# Patient Record
Sex: Female | Born: 1935 | Race: White | Hispanic: No | Smoking: Former smoker
Health system: Southern US, Community
[De-identification: ages and names within clinical notes are randomized; demographics above are authoritative.]

## PROBLEM LIST (undated history)

## (undated) DIAGNOSIS — E871 Hypo-osmolality and hyponatremia: Secondary | ICD-10-CM

## (undated) DIAGNOSIS — I10 Essential (primary) hypertension: Secondary | ICD-10-CM

## (undated) DIAGNOSIS — E039 Hypothyroidism, unspecified: Secondary | ICD-10-CM

## (undated) DIAGNOSIS — N189 Chronic kidney disease, unspecified: Secondary | ICD-10-CM

## (undated) DIAGNOSIS — F419 Anxiety disorder, unspecified: Secondary | ICD-10-CM

## (undated) DIAGNOSIS — M1990 Unspecified osteoarthritis, unspecified site: Secondary | ICD-10-CM

## (undated) DIAGNOSIS — E876 Hypokalemia: Secondary | ICD-10-CM

## (undated) HISTORY — DX: Hypokalemia: E87.6

## (undated) HISTORY — DX: Anxiety disorder, unspecified: F41.9

## (undated) HISTORY — PX: SHOULDER SURGERY: SHX246

## (undated) HISTORY — DX: Unspecified osteoarthritis, unspecified site: M19.90

## (undated) HISTORY — DX: Hypo-osmolality and hyponatremia: E87.1

## (undated) HISTORY — DX: Hypothyroidism, unspecified: E03.9

## (undated) HISTORY — DX: Chronic kidney disease, unspecified: N18.9

## (undated) HISTORY — PX: WRIST SURGERY: SHX841

## (undated) HISTORY — DX: Essential (primary) hypertension: I10

---

## 1998-03-09 ENCOUNTER — Ambulatory Visit (HOSPITAL_COMMUNITY): Admission: RE | Admit: 1998-03-09 | Discharge: 1998-03-09 | Payer: Self-pay | Admitting: Endocrinology

## 1998-03-09 ENCOUNTER — Encounter: Payer: Self-pay | Admitting: Endocrinology

## 1998-03-15 ENCOUNTER — Other Ambulatory Visit: Admission: RE | Admit: 1998-03-15 | Discharge: 1998-03-15 | Payer: Self-pay | Admitting: Obstetrics and Gynecology

## 1999-10-06 ENCOUNTER — Other Ambulatory Visit: Admission: RE | Admit: 1999-10-06 | Discharge: 1999-10-06 | Payer: Self-pay | Admitting: Obstetrics and Gynecology

## 1999-11-08 ENCOUNTER — Encounter: Admission: RE | Admit: 1999-11-08 | Discharge: 1999-11-08 | Payer: Self-pay | Admitting: Urology

## 1999-11-08 ENCOUNTER — Encounter: Payer: Self-pay | Admitting: Urology

## 2002-10-02 ENCOUNTER — Encounter: Payer: Self-pay | Admitting: Endocrinology

## 2002-10-02 ENCOUNTER — Encounter: Admission: RE | Admit: 2002-10-02 | Discharge: 2002-10-02 | Payer: Self-pay | Admitting: Endocrinology

## 2005-04-20 ENCOUNTER — Ambulatory Visit (HOSPITAL_COMMUNITY): Admission: RE | Admit: 2005-04-20 | Discharge: 2005-04-21 | Payer: Self-pay | Admitting: Orthopedic Surgery

## 2007-02-06 ENCOUNTER — Encounter: Admission: RE | Admit: 2007-02-06 | Discharge: 2007-02-06 | Payer: Self-pay | Admitting: Endocrinology

## 2010-08-26 NOTE — Op Note (Signed)
NAME:  Bethany Blake, Bethany Blake                   ACCOUNT NO.:  [id number]   MEDICAL RECORD NO.:  [id number]          PATIENT TYPE:  AMB   LOCATION:  DAY                          FACILITY:  [facility]   PHYSICIAN:  Ronald A. Gioffre, M.D.DATE OF BIRTH:  [birth year]   DATE OF PROCEDURE:  04/19/2005  DATE OF DISCHARGE:                                 OPERATIVE REPORT   SURGEON:  Georges Lynch. Darrelyn Hillock, M.D.   ASSISTANT:  Jamelle Rushing, P.A.   PREOPERATIVE DIAGNOSIS:  1.  Complete retracted tear of the rotator cuff tendon, right shoulder with      absence of abduction preop.  2.  Severe degenerative arthritis, right shoulder.   POSTOPERATIVE DIAGNOSIS:  1.  Complete retracted tear of the rotator cuff tendon, right shoulder with      absence of abduction preop.  2.  Severe degenerative arthritis, right shoulder.   OPERATION:  1.  Open decompression and acromioplasty, right shoulder.  2.  Exploration the rotator cuff, right shoulder.  3.  Synovectomy, right shoulder.   DESCRIPTION OF PROCEDURE:  Under general anesthesia, routine orthopedic prep  and draping of the right shoulder was carried out. Note prior to the general  anesthetic, she had an interscalene nerve block on the right. Sterile prep  and draping was carried out, an incision was made over the anterior aspect  of the right shoulder, bleeders identified and cauterized. I went down and  identified the acromion and the acromion was severely arthritic thickened  and down sloped. At first I went ahead and I did a partial acromionectomy  utilizing the oscillating saw and a bur. Once we established the subacromial  space, we noted that there was NO ROTATOR CUFF PRESENT. It was completely  disintegrated. She had a severe synovectomy with a large amount of fluid in  the joint. I did a synovectomy at this particular time. I did the open  acromionectomy, I explored the cuff and there was no cuff to repair.  I  explored the long head of the biceps, it was  intact but it was markedly  thinned. So basically this was an irreparable cuff lesion. There was no room  or anything to attach a graft to. We thoroughly irrigated out the area,  reapproximated the deltoid tendon and the muscle in the usual fashion. The  subcu was closed with #0 Vicryl, skin with metal staples and a sterile  dressing was applied. She was placed in a shoulder immobilizer.           ______________________________  Georges Lynch Darrelyn Hillock, M.D.     RAG/MEDQ  D:  04/20/2005  T:  04/21/2005  Job:  [id number]

## 2011-01-03 ENCOUNTER — Emergency Department (HOSPITAL_COMMUNITY): Payer: Medicare Other

## 2011-01-03 ENCOUNTER — Observation Stay (HOSPITAL_COMMUNITY)
Admission: EM | Admit: 2011-01-03 | Discharge: 2011-01-04 | Disposition: A | Discharge status: Home / Self Care | Payer: Medicare Other | Attending: Internal Medicine | Admitting: Internal Medicine

## 2011-01-03 DIAGNOSIS — E039 Hypothyroidism, unspecified: Secondary | ICD-10-CM | POA: Insufficient documentation

## 2011-01-03 DIAGNOSIS — I10 Essential (primary) hypertension: Secondary | ICD-10-CM | POA: Insufficient documentation

## 2011-01-03 DIAGNOSIS — S52123A Displaced fracture of head of unspecified radius, initial encounter for closed fracture: Secondary | ICD-10-CM | POA: Insufficient documentation

## 2011-01-03 DIAGNOSIS — S1093XA Contusion of unspecified part of neck, initial encounter: Secondary | ICD-10-CM | POA: Insufficient documentation

## 2011-01-03 DIAGNOSIS — S52539A Colles' fracture of unspecified radius, initial encounter for closed fracture: Secondary | ICD-10-CM | POA: Insufficient documentation

## 2011-01-03 DIAGNOSIS — IMO0002 Reserved for concepts with insufficient information to code with codable children: Secondary | ICD-10-CM | POA: Insufficient documentation

## 2011-01-03 DIAGNOSIS — S0003XA Contusion of scalp, initial encounter: Secondary | ICD-10-CM | POA: Insufficient documentation

## 2011-01-03 DIAGNOSIS — S060X0A Concussion without loss of consciousness, initial encounter: Principal | ICD-10-CM | POA: Insufficient documentation

## 2011-01-03 DIAGNOSIS — S62319A Displaced fracture of base of unspecified metacarpal bone, initial encounter for closed fracture: Secondary | ICD-10-CM | POA: Insufficient documentation

## 2011-01-03 DIAGNOSIS — Y998 Other external cause status: Secondary | ICD-10-CM | POA: Insufficient documentation

## 2011-01-03 DIAGNOSIS — W101XXA Fall (on)(from) sidewalk curb, initial encounter: Secondary | ICD-10-CM | POA: Insufficient documentation

## 2011-01-03 DIAGNOSIS — S0180XA Unspecified open wound of other part of head, initial encounter: Secondary | ICD-10-CM | POA: Insufficient documentation

## 2011-01-03 DIAGNOSIS — Y9229 Other specified public building as the place of occurrence of the external cause: Secondary | ICD-10-CM | POA: Insufficient documentation

## 2011-01-03 DIAGNOSIS — R112 Nausea with vomiting, unspecified: Secondary | ICD-10-CM | POA: Insufficient documentation

## 2011-01-03 LAB — DIFFERENTIAL
Basophils Relative: 0 % (ref 0–1)
Lymphs Abs: 1.2 10*3/uL (ref 0.7–4.0)
Monocytes Relative: 6 % (ref 3–12)
Neutro Abs: 5.9 10*3/uL (ref 1.7–7.7)
Neutrophils Relative %: 77 % (ref 43–77)

## 2011-01-03 LAB — BASIC METABOLIC PANEL
CO2: 23 mEq/L (ref 19–32)
Calcium: 8.7 mg/dL (ref 8.4–10.5)
Chloride: 95 mEq/L — ABNORMAL LOW (ref 96–112)
Glucose, Bld: 122 mg/dL — ABNORMAL HIGH (ref 70–99)
Sodium: 130 mEq/L — ABNORMAL LOW (ref 135–145)

## 2011-01-03 LAB — CBC
Hemoglobin: 12.7 g/dL (ref 12.0–15.0)
MCH: 29.7 pg (ref 26.0–34.0)
RBC: 4.28 MIL/uL (ref 3.87–5.11)

## 2011-01-04 LAB — CBC
HCT: 34.5 % — ABNORMAL LOW (ref 36.0–46.0)
HCT: 33.5 % — ABNORMAL LOW (ref 36.0–46.0)
Hemoglobin: 11.8 g/dL — ABNORMAL LOW (ref 12.0–15.0)
MCH: 28.9 pg (ref 26.0–34.0)
MCH: 28.8 pg (ref 26.0–34.0)
MCHC: 33.7 g/dL (ref 30.0–36.0)
MCV: 84.4 fL (ref 78.0–100.0)
MCV: 85.5 fL (ref 78.0–100.0)
Platelets: 231 10*3/uL (ref 150–400)
RBC: 4.09 MIL/uL (ref 3.87–5.11)
RDW: 13 % (ref 11.5–15.5)

## 2011-01-04 LAB — BASIC METABOLIC PANEL
BUN: 9 mg/dL (ref 6–23)
Creatinine, Ser: 0.72 mg/dL (ref 0.50–1.10)
GFR calc Af Amer: >60 mL/min (ref >60)
GFR calc non Af Amer: >60 mL/min (ref >60)
Glucose, Bld: 168 mg/dL — ABNORMAL HIGH (ref 70–99)

## 2011-01-07 NOTE — H&P (Signed)
  NAMESHIELA, Bethany Blake                   ACCOUNT NO.:  [id number]  MEDICAL RECORD NO.:  [id number]  LOCATION:  1414                         FACILITY:  [facility]  PHYSICIAN:  Dionne Ano. Naina Sleeper, M.D.DATE OF BIRTH:  May 11, 1921  DATE OF ADMISSION:  01/03/2011 DATE OF DISCHARGE:                             HISTORY & PHYSICAL   ADDENDUM:  [name] also is noted to have a fifth metacarpal base fracture right hand where she is minimally displaced and acceptable for close treatment.  I have discussed this with her at length and we did go ahead and split this well.  DIAGNOSIS/IMPRESSION:  Closed fifth metacarpal fracture.  We did perform I and D of skin, subcutaneous tissue, right dorsal forearm and she tolerated this well at her bedside.     Dionne Ano. Amanda Pea, M.D.     [facility]  D:  01/03/2011  T:  01/03/2011  Job:  [id number]  Electronically Signed by Dominica Severin M.D. on 01/07/2011 05:31:15 PM

## 2011-01-07 NOTE — H&P (Signed)
Bethany Blake, Bethany Blake                   ACCOUNT NO.:  [id number]  MEDICAL RECORD NO.:  [id number]  LOCATION:  WLED                         FACILITY:  [facility]  PHYSICIAN:  Dionne Ano. Selmer Adduci, M.D.DATE OF BIRTH:  06-01-1921  DATE OF ADMISSION:  01/03/2011 DATE OF DISCHARGE:                             HISTORY & PHYSICAL   It was pleasure to see Bethany Blake today.  Bethany Blake is a pleasant female who fell and sustained laceration to her head as well as abrasions to the lower extremities, upper extremities and a left minimally displaced proximal radius fracture at the elbow as well as a significantly displaced right distal radius fracture.  I was asked to see her for orthopedic needs.  She has been stabilized by the emergency room staff.  I should note she had a CT of her head which is negative for intracranial abnormality.  She has had a CT of her C-spine which is negative.  PAST MEDICAL HISTORY: 1. Thyroid disease. 2. Hypertension.  She is noted under medicines to take clonidine and thyroid replacement medicines.  PAST SURGICAL HISTORY:  Includes shoulder surgery by Dr. Darrelyn Hillock.  She lives with her husband.  She does not smoke or drink.  ALLERGIES:  She is allergic to SULFA.  REVIEW OF SYSTEMS:  Negative for fever, chills, nausea, vomiting, or malaise.  PHYSICAL EXAMINATION:  GENERAL:  Patient is a pleasant female.  Alert and oriented in no acute distress. VITAL SIGNS:  Stable. HEENT:  The patient has periorbital swelling and ecchymosis about her right eye.  Oropharynx and nasopharynx were clear.  She breathes normally.  She has no JVD.  No supraclavicular mass. EXTREMITIES:  Shoulder on the right side has a surgical incision which is well healed.  She has a very stiff arthritic shoulder, it appears on the right side.  Left shoulder is nontender.  Left upper extremity has ecchymosis about it.  No focal defects other than a nondisplaced proximal radius fracture.  This is a radial neck  head region.  She has ecchymosis around her IV site and blood pressure cuff on.  Left upper extremity is reviewed at length.  The right upper extremity has a displaced distal radius fracture.  She is neurovascularly intact. Otherwise she has laceration of the dorsal aspect of her forearm, but this does not appear to be a communicating open fracture.  There is no hematoma, blood emanating from the site.  Lower extremity examination shows abrasions about both knees, but she can perform a straight-leg- raise without pain. PELVIS:  Stable. ABDOMEN:  Nontender. CHEST:  Has equal expansion and is clear.  X-rays show a nondisplaced left elbow fracture about the radial neck head region.  X-rays of the right wrist showed a displaced distal radius fracture with acute angulation.  This is a Smith-type pattern and is unstable.  CT of the head is negative other than the swelling outside of the skull.  The patient's C-spine is negative other than some significant degree of degenerative change.  IMPRESSION: 1. Status post fall with facial trauma, periorbital swelling and     bruising as well as knee contusions and abrasions. 2. Status post fall with distal  radius fracture, right upper extremity     acutely displaced in a Smith-type pattern. 3. Left elbow, radial head, and neck fracture. 4. Thyroid disease. 5. Hypertension. 6. The patient is 77 years of age with some degree of comorbidities.  RECOMMENDATIONS:  I have discussed with the patient findings in regards to the left elbow.  We will treat this conservatively with immobilization and cautious use.  Typically, these healed quite well. In regards to the right upper extremity, I have recommended ORIF at a mutually convenient time in the future.  We have discussed risks and benefits of bleeding, infection, anesthesia, damage to normal structures, and failure of the surgery to accomplish its intended goals of relieving symptoms and restoring  function.  With this in mind, we will proceed accordingly at a mutually convenient time if she desires. She and I have had a long discussion about this today and the relevant issues, dos and don'ts.  The patient's lower extremity should do well with simple treatment of her abrasions.  We will watch her medical status in the hospital closely and ask her to notify should any problems occur.  These notes have been discussed and all questions have been encouraged and answered.     Dionne Ano. Amanda Pea, M.D.     [facility]  D:  01/03/2011  T:  01/03/2011  Job:  [id number]  Electronically Signed by Dominica Severin M.D. on 01/07/2011 05:31:12 PM

## 2011-01-17 NOTE — Discharge Summary (Signed)
Bethany Blake, Bethany Blake                   ACCOUNT NO.:  [id number]  MEDICAL RECORD NO.:  [id number]  LOCATION:  1414                         FACILITY:  [facility]  PHYSICIAN:  Kathlen Mody, MD       DATE OF BIRTH:  Mar 15, 1922  DATE OF ADMISSION:  01/03/2011 DATE OF DISCHARGE:  01/04/2011                              DISCHARGE SUMMARY   DISCHARGE DIAGNOSES: 1. Status post mechanical fall with a large periorbital hematoma with     multiple fractures including a right radius, left elbow, and right     MC. 2. Hypertension. 3. Hypothyroidism. 4. Hyponatremia, persistent. 5. Hypokalemia, repleted.  DISCHARGE MEDICATIONS: 1. Vicodin 5/325, 1-2 tabs q.8 hours as needed. 2. Clonidine 0.2 mg 1 tab twice a day. 3. Fish oil 200 mg 2 capsules daily. 4. Synthroid 100 mg 1 tab daily.  PERTINENT LABS:  The patient had a CBC which was within normal limits. Basic metabolic panel is significant for a sodium of 130, chloride of 95, and glucose of 122.  Repeat CBC showed a hemoglobin of 11.8.  CBC on January 04, 2011, showed hemoglobin of 11.3 and a basic metabolic panel showed a sodium of 129 and potassium of 3.4.  DIAGNOSTIC STUDIES: 1. The patient had a CT head without contrast, which showed soft     tissue hematoma overlying the right frontal bone.  No evidence of     calvarial fracture and no evidence of acute intracranial     abnormality.  CT of cervical spine shows no evidence of traumatic     injury to the cervical spine, multilevel degenerative changes. 2. X-ray of the right breast showed distal radius and 5th metacarpal     fractures. 3. X-ray of the pelvis shows no acute bony findings. 4. X-ray of the left elbow shows cortical irregularity involving the     lateral aspect of the radial head seen only on the provided AP,     radiographically worrisome for minimally displaced radial head     fracture.  Correlation for point tenderness at this location is     recommended. 5. X-ray of the  right wrist, improved alignment of the comminuted     fracture through the distal radial metadiaphysis, persistent volar     and radial displacement in order though previously noted angulation     has largely resolved. 6. Two view chest x-ray on January 04, 2011, shows mild left basilar     opacity, may reflect atelectasis, or mild pneumonia, mild     cardiomegaly, and degenerative disk changes at the right humeral     head.  BRIEF HOSPITAL COURSE:  This is an 77 year old lady with history of hypertension, hypothyroidism, was brought in by EMS because of mechanical fall earlier today.  She was at [facility] and fell walking over the curb and injured her face.  She was brought to the ED.  She was found to have lacerations with the large periorbital hematoma with multiple fractures on the right wrist and left elbow.  An Orthopedic consult was called while in the ER and recommended treat the patient conservatively with immobilization and cautious use, and  also recommended ORIF of the right upper extremity at a convenient time in the future.  Patient's lower extremity did not have any fractures and required simple treatment of her abrasions.  The patient's 5th metacarpal base fracture was minimally displaced and amenable for closed treatment.  She also underwent I and D of the skin subcutaneous tissue and right dorsal forearm and she tolerated the procedure well at the bedside.  Further, patient was admitted to hospitalist service for observation overnight for her blood pressure and she was put on neuro checks.  On January 04, 2011, the patient and patient's family were persistently wanted to go home.  I have discussed personally the risks of going home without further evaluation for her hyponatremia and for her electrolyte imbalances, but the patient and her family were persistent on going home and they said they will follow with their primary care physician as needed.   The patient's PT/OT evaluation was obtained.  PT recommended home PT which the patient and the family have persistently refused at this time.  The patient's blood pressure was controlled. Hypothyroidism, she was continued on home dose of Synthroid.  PHYSICAL EXAMINATION:  VITAL SIGNS:  Patient's vitals on the day of discharge was a temperature of 98.2, pulse of 82, blood pressure 166/70, and saturating 97% on 2L. GENERAL:  On exam, patient was alert, afebrile, comfortable, and in no acute distress. CARDIOVASCULAR:  S1, S2 heard. RESPIRATORY:  Shows clear to auscultation bilaterally.  ABDOMEN:  Soft and nontender. MUSCULOSKELETAL:  Multiple bruising over her extremities and a periorbital hematoma.          ______________________________ Kathlen Mody, MD     [facility]  D:  01/16/2011  T:  01/17/2011  Job:  [id number]  Electronically Signed by Kathlen Mody MD on 01/17/2011 10:33:48 PM

## 2011-02-05 NOTE — H&P (Signed)
NAMEBRYER, Bethany Blake                   ACCOUNT NO.:  [id number]  MEDICAL RECORD NO.:  [id number]  LOCATION:  WLED                         FACILITY:  [facility]  PHYSICIAN:  Jonny Ruiz, MD    DATE OF BIRTH:  12-Jul-1921  DATE OF ADMISSION:  01/03/2011 DATE OF DISCHARGE:                             HISTORY & PHYSICAL   CHIEF COMPLAINT:  Fall.  HISTORY OF PRESENT ILLNESS:  The patient is an 77 year old healthy woman presenting to the ED via EMS because of a fall earlier today.  She was at [facility] and fell walking over the curb and injured her face.  She was brought to the ED alert and oriented.  She was found with lacerations in the face and a large periorbital hematoma as well as multiple fractures on the right wrist and left elbow.  She is currently asymptomatic.  She was having nausea, received Zofran earlier and now she is better.  She also had a headache earlier, but now has resolved. She is currently asymptomatic.  She had no warning symptoms prior to the fall, specifically denied any chest pain or palpitations, lightheadedness, or dizziness.  She has no history of falls.  She is otherwise pretty healthy.  PAST MEDICAL HISTORY: 1. Hypertension. 2. Hypothyroidism.  SURGICAL HISTORY:  Shoulder repair.  MEDICATIONS: 1. Synthroid 100 mcg a day. 2. Clonidine 0.2 mg p.o. b.i.d. 3. Aspirin over-the-counter 81 mg a day  ALLERGIES:  SULFA.  SOCIAL HISTORY:  The patient lives with her husband.  She takes care of herself.  She denies toxic habits.  Specifically, no tobacco or alcohol.  PHYSICAL EXAMINATION:  VITAL SIGNS:  The blood pressure is 128/81, pulse 81, respirations 18, temperature 98.7, O2 sat 96%. GENERAL APPEARANCE:  The patient appears comfortable in bed.  She is alert and oriented. HEENT: The patient has a large right periorbital hematoma with a suture laceration.  I am unable to see her right eye.  The left eye is intact. Nose intact. NECK:  Supple  and nontender.  No carotid bruits. HEART:  Regular, S1 and S2 without gallops, murmurs, or rubs. LUNGS:  Clear to auscultation. ABDOMEN:  Soft, nontender. EXTREMITIES: The patient has already been placed on cast by Ortho earlier today, right arm and left.  Lower extremities normal with no deformities, no clubbing, cyanosis or edema. NEUROLOGICAL EXAMINATION:  Grossly nonfocal.  DIAGNOSTIC DATA:  CT head, no hematomas, no hemorrhage.  CT Neck:  No fractures or dislocation.  X-rays of the pelvis normal bilaterally.  X- rays of the arms showing right radius closed mid, left elbow fracture, closed and right MC fracture.  EKG not performed.  Basic metabolic panel:  Sodium 130, potassium 3.8, chloride 95, carbon dioxide 23, glucose 122, BUN 10, creatinine 0.59, calcium 8.7.  WBC 7.7, hemoglobin 12.7, hematocrit 36.1, platelet count 223.  ASSESSMENT AND PLAN:  An 77 year old female presenting with a mechanical fall resulting in, 1. Head concussion with right periorbital hematoma. 2. Laceration, repaired by ED physician. 3. Multiple fractures including right radius, left elbow, right MC. 4. Headache and nausea, resolving. 5. History of hypertension and hypothyroidism.  PLAN:  Admit the patient to  observation telemetry.  Neuro checks. Control pain.  Fractures will be managed as an outpatient with elective ORIF.  Appointment given for Tuesday next week.  The patient will continue on clonidine, will receive a dose tonight and then in the morning.  The patient already took dose of Synthroid earlier today.  We will continue at the same dose.  Repeat H and H in a.m.          ______________________________ Jonny Ruiz, MD     GL/MEDQ  D:  01/03/2011  T:  01/03/2011  Job:  [id number]  Electronically Signed by Jonny Ruiz MD on 02/05/2011 09:30:16 PM

## 2011-07-24 ENCOUNTER — Encounter: Payer: Self-pay | Admitting: Gastroenterology

## 2011-08-15 ENCOUNTER — Ambulatory Visit (INDEPENDENT_AMBULATORY_CARE_PROVIDER_SITE_OTHER): Payer: Medicare Other | Admitting: Gastroenterology

## 2011-08-15 ENCOUNTER — Encounter: Payer: Self-pay | Admitting: Gastroenterology

## 2011-08-15 DIAGNOSIS — K625 Hemorrhage of anus and rectum: Secondary | ICD-10-CM

## 2011-08-15 DIAGNOSIS — K5900 Constipation, unspecified: Secondary | ICD-10-CM

## 2011-08-15 MED ORDER — MOVIPREP 100 G PO SOLR: 1.0000 | ORAL | Status: DC

## 2011-08-15 NOTE — Patient Instructions (Signed)
You will be set up for a colonoscopy (LEC) for rectal bleeding. A copy of this information will be made available to Dr. Juleen China.

## 2011-08-15 NOTE — Progress Notes (Signed)
HPI: This is a    78 year old woman whom I am meeting for the first time today.  She is here with her daughter  She is always pretty constipated.  Skipped the prune juice, laxatives.  Became more constipated than usual.  Didn't have BM for 2-3 days.  Had very hard, solid stool had to push strain to expel.  She had dripping of blood from rectum but no pain.  The dripping of blood occurred one day and then a small amount the next day.  No colon polyps or cancers.  She has never had a colonoscopy  Overall stable weight.   Maybe a couple pounds down intentionally.    Usually has a BM every day.  Takes equate bid, prune juice.  Has always been constipated.    She takes no pain meds.    Review of systems: Pertinent positive and negative review of systems were noted in the above HPI section. Complete review of systems was performed and was otherwise normal.    Past Medical History  Diagnosis Date  . Hypertension   . Hypothyroidism   . Hyponatremia   . Hypokalemia   . Arthritis   . Anxiety     Past Surgical History  Procedure Date  . Shoulder surgery   . Wrist surgery     Current Outpatient Prescriptions  Medication Sig Dispense Refill  . cloNIDine (CATAPRES) 0.2 MG tablet Take 0.2 mg by mouth 3 (three) times daily.      Marland Kitchen docusate sodium (COLACE) 100 MG capsule Take 100 mg by mouth 3 (three) times daily as needed.      Marland Kitchen levothyroxine (SYNTHROID, LEVOTHROID) 100 MCG tablet Take 100 mcg by mouth daily.      . nebivolol (BYSTOLIC) 5 MG tablet Take 5 mg by mouth daily.      Marland Kitchen olmesartan (BENICAR) 20 MG tablet Take 20 mg by mouth daily.      . Omega-3 Fatty Acids (FISH OIL) 1200 MG CAPS Take 2 capsules by mouth daily.      Marland Kitchen spironolactone (ALDACTONE) 25 MG tablet Take 25 mg by mouth daily.        Allergies as of 08/15/2011 - Review Complete 08/15/2011  Allergen Reaction Noted  . Sulfa antibiotics Rash 08/15/2011    Family History  Problem Relation Age of Onset  .        History   Social History  . Marital Status: Married    Spouse Name: N/A    Number of Children: 3  . Years of Education: N/A   Occupational History  . Retired    Social History Main Topics  . Smoking status: Former Games developer  . Smokeless tobacco: Never Used  . Alcohol Use: No  . Drug Use: No  . Sexually Active: Not on file   Other Topics Concern  . Not on file   Social History Narrative  . No narrative on file       Physical Exam: BP 182/78  Pulse 72  Wt 146 lb 9.6 oz (66.497 kg) Constitutional: generally well-appearing Psychiatric: alert and oriented x3 Eyes: extraocular movements intact Mouth: oral pharynx moist, no lesions Neck: supple no lymphadenopathy Cardiovascular: heart regular rate and rhythm Lungs: clear to auscultation bilaterally Abdomen: soft, nontender, nondistended, no obvious ascites, no peritoneal signs, normal bowel sounds Extremities: no lower extremity edema bilaterally Skin: no lesions on visible extremities Rectal exam deferred for upcoming colonoscopy.   Assessment and plan: 78 y.o. female with  recent rectal bleeding, change  in bowel habits  She has never had colonoscopy. Her bowel habits did normalize after she resumed her usual prune juice, laxative regimen. I suspect minor anal rectal disease however since she has never had a colonoscopy we should proceed with 1 at her soonest convenience to rule out neoplastic causes.

## 2011-08-16 ENCOUNTER — Telehealth: Payer: Self-pay | Admitting: Gastroenterology

## 2011-08-16 NOTE — Telephone Encounter (Signed)
The pt called to have her medical record corrected to state she is on BID spironolactone.

## 2011-09-06 ENCOUNTER — Telehealth: Payer: Self-pay | Admitting: Gastroenterology

## 2011-09-06 NOTE — Telephone Encounter (Signed)
The pt called to make sure that Dr Christella Hartigan was aware that she has a dropped bladder and and wanted to be sure that it would not interfere with the colon scheduled for 09/12/11.  I reassured her that it would not interfere with the procedure.  Pt thanked me for calling

## 2011-09-12 ENCOUNTER — Ambulatory Visit (AMBULATORY_SURGERY_CENTER): Payer: Medicare Other | Admitting: Gastroenterology

## 2011-09-12 ENCOUNTER — Encounter: Payer: Self-pay | Admitting: Gastroenterology

## 2011-09-12 VITALS — BP 153/74 | HR 71 | Temp 99.6°F | Resp 20 | Ht 62.0 in | Wt 146.0 lb

## 2011-09-12 DIAGNOSIS — K648 Other hemorrhoids: Secondary | ICD-10-CM

## 2011-09-12 DIAGNOSIS — K625 Hemorrhage of anus and rectum: Secondary | ICD-10-CM

## 2011-09-12 DIAGNOSIS — K5730 Diverticulosis of large intestine without perforation or abscess without bleeding: Secondary | ICD-10-CM

## 2011-09-12 DIAGNOSIS — K5900 Constipation, unspecified: Secondary | ICD-10-CM

## 2011-09-12 MED ORDER — SODIUM CHLORIDE 0.9 % IV SOLN: 500.0000 mL | INTRAVENOUS | Status: DC

## 2011-09-12 NOTE — Op Note (Signed)
[facility] [address] [place], [place]  [address]  COLONOSCOPY PROCEDURE REPORT  PATIENT:  Bethany, Blake  MR#:  [id number] BIRTHDATE:  05-22-1921, 89 yrs. old  GENDER:  female ENDOSCOPIST:  Rachael Fee, MD REF. BY:  Adela Lank, M.D. PROCEDURE DATE:  09/12/2011 PROCEDURE:  Colonoscopy 84132 ASA CLASS:  Class III INDICATIONS:  resolved rectal bleeding, never had clonoscopy MEDICATIONS:   Fentanyl 25 mcg IV, These medications were titrated to patient response per physician's verbal order, Versed 3 mg IV  DESCRIPTION OF PROCEDURE:   After the risks benefits and alternatives of the procedure were thoroughly explained, informed consent was obtained.  Digital rectal exam was performed and revealed no rectal masses.   The LB [id number] endoscope was introduced through the anus and advanced to the cecum, which was identified by both the appendix and ileocecal valve, without limitations.  The quality of the prep was good..  The instrument was then slowly withdrawn as the colon was fully examined. <<PROCEDUREIMAGES>> FINDINGS:  Mild diverticulosis was found throughout the colon (see image4).  Internal Hemorrhoids were found.  This was otherwise a normal examination of the colon (see image5 and image2). Retroflexed views in the rectum revealed no abnormalities. COMPLICATIONS:  None  ENDOSCOPIC IMPRESSION: 1) Mild diverticulosis throughout the colon 2) Internal hemorrhoids 3) Otherwise normal examination  RECOMMENDATIONS: Follow clinically for signifcant overt rebleeding.  ______________________________ Rachael Fee, MD  n. eSIGNED:   Rachael Fee at 09/12/2011 02:23 PM  [place], [place], [phone]

## 2011-09-12 NOTE — Progress Notes (Signed)
Patient did not have preoperative order for IV antibiotic SSI prophylaxis. (G8918)  Patient did not experience any of the following events: a burn prior to discharge; a fall within the facility; wrong site/side/patient/procedure/implant event; or a hospital transfer or hospital admission upon discharge from the facility. (G8907)  

## 2011-09-12 NOTE — Patient Instructions (Signed)
YOU HAD AN ENDOSCOPIC PROCEDURE TODAY AT THE [facility]: Refer to the procedure report that was given to you for any specific questions about what was found during the examination.  If the procedure report does not answer your questions, please call your gastroenterologist to clarify.  If you requested that your care partner not be given the details of your procedure findings, then the procedure report has been included in a sealed envelope for you to review at your convenience later.  YOU SHOULD EXPECT: Some feelings of bloating in the abdomen. Passage of more gas than usual.  Walking can help get rid of the air that was put into your GI tract during the procedure and reduce the bloating. If you had a lower endoscopy (such as a colonoscopy or flexible sigmoidoscopy) you may notice spotting of blood in your stool or on the toilet paper. If you underwent a bowel prep for your procedure, then you may not have a normal bowel movement for a few days.  DIET: Your first meal following the procedure should be a light meal and then it is ok to progress to your normal diet.  A half-sandwich or bowl of soup is an example of a good first meal.  Heavy or fried foods are harder to digest and may make you feel nauseous or bloated.  Likewise meals heavy in dairy and vegetables can cause extra gas to form and this can also increase the bloating.  Drink plenty of fluids but you should avoid alcoholic beverages for 24 hours.  ACTIVITY: Your care partner should take you home directly after the procedure.  You should plan to take it easy, moving slowly for the rest of the day.  You can resume normal activity the day after the procedure however you should NOT DRIVE or use heavy machinery for 24 hours (because of the sedation medicines used during the test).    SYMPTOMS TO REPORT IMMEDIATELY: A gastroenterologist can be reached at any hour.  During normal business hours, 8:30 AM to 5:00 PM Monday through Friday,  call [phone].  After hours and on weekends, please call the GI answering service at [phone] who will take a message and have the physician on call contact you.   Following lower endoscopy (colonoscopy or flexible sigmoidoscopy):  Excessive amounts of blood in the stool  Significant tenderness or worsening of abdominal pains  Swelling of the abdomen that is new, acute  Fever of 100F or higher    FOLLOW UP: If any biopsies were taken you will be contacted by phone or by letter within the next 1-3 weeks.  Call your gastroenterologist if you have not heard about the biopsies in 3 weeks.  Our staff will call the home number listed on your records the next business day following your procedure to check on you and address any questions or concerns that you may have at that time regarding the information given to you following your procedure. This is a courtesy call and so if there is no answer at the home number and we have not heard from you through the emergency physician on call, we will assume that you have returned to your regular daily activities without incident.  SIGNATURES/CONFIDENTIALITY: You and/or your care partner have signed paperwork which will be entered into your electronic medical record.  These signatures attest to the fact that that the information above on your After Visit Summary has been reviewed and is understood.  Full responsibility of the confidentiality   of this discharge information lies with you and/or your care-partner.     

## 2011-09-13 ENCOUNTER — Telehealth: Payer: Self-pay | Admitting: *Deleted

## 2011-09-13 NOTE — Telephone Encounter (Signed)
  Follow up Call-  Call back number 09/12/2011  Post procedure Call Back phone  # [phone]  Permission to leave phone message No  comments NO ANSWERING MACHINE     Patient questions:  Do you have a fever, pain , or abdominal swelling? no Pain Score  0 *  Have you tolerated food without any problems? yes  Have you been able to return to your normal activities? yes  Do you have any questions about your discharge instructions: Diet   no Medications  no Follow up visit  no  Do you have questions or concerns about your Care? no  Actions: * If pain score is 4 or above: No action needed, pain <4.

## 2013-12-04 ENCOUNTER — Ambulatory Visit (INDEPENDENT_AMBULATORY_CARE_PROVIDER_SITE_OTHER): Payer: Medicare Other | Admitting: Podiatry

## 2013-12-04 VITALS — BP 132/69 | HR 89 | Resp 16

## 2013-12-04 DIAGNOSIS — B351 Tinea unguium: Secondary | ICD-10-CM

## 2013-12-04 DIAGNOSIS — M79673 Pain in unspecified foot: Secondary | ICD-10-CM

## 2013-12-04 DIAGNOSIS — M79609 Pain in unspecified limb: Secondary | ICD-10-CM

## 2013-12-04 NOTE — Progress Notes (Signed)
Subjective:     Patient ID: Bethany Blake, female   DOB: 12-05-1921, 80 y.o.   MRN: [id number]  HPI patient presents with the long gaited nailbeds 1-5 both feet that are thick and painful and she cannot wear shoe gear or cut them herself   Review of Systems     Objective:   Physical Exam Neurovascular status unchanged with thick yellow he long gaited brittle nailbeds 1-5 both feet that are painful    Assessment:     Mycotic nail infection is with pain 1-5 both feet    Plan:     Debride painful nailbeds 1-5 both feet with no iatrogenic bleeding noted

## 2014-08-27 ENCOUNTER — Inpatient Hospital Stay (HOSPITAL_COMMUNITY)
Admission: EM | Admit: 2014-08-27 | Discharge: 2014-09-01 | DRG: 644 | Disposition: A | Discharge status: Skilled Nursing Facility | Payer: Medicare (Managed Care) | Attending: Internal Medicine | Admitting: Internal Medicine

## 2014-08-27 ENCOUNTER — Emergency Department (HOSPITAL_COMMUNITY): Payer: Medicare (Managed Care)

## 2014-08-27 ENCOUNTER — Encounter (HOSPITAL_COMMUNITY): Payer: Self-pay

## 2014-08-27 DIAGNOSIS — Z7982 Long term (current) use of aspirin: Secondary | ICD-10-CM

## 2014-08-27 DIAGNOSIS — N179 Acute kidney failure, unspecified: Secondary | ICD-10-CM | POA: Diagnosis present

## 2014-08-27 DIAGNOSIS — Z882 Allergy status to sulfonamides status: Secondary | ICD-10-CM

## 2014-08-27 DIAGNOSIS — R410 Disorientation, unspecified: Secondary | ICD-10-CM | POA: Diagnosis present

## 2014-08-27 DIAGNOSIS — E222 Syndrome of inappropriate secretion of antidiuretic hormone: Secondary | ICD-10-CM | POA: Diagnosis not present

## 2014-08-27 DIAGNOSIS — S0101XA Laceration without foreign body of scalp, initial encounter: Secondary | ICD-10-CM

## 2014-08-27 DIAGNOSIS — E871 Hypo-osmolality and hyponatremia: Secondary | ICD-10-CM | POA: Diagnosis not present

## 2014-08-27 DIAGNOSIS — E878 Other disorders of electrolyte and fluid balance, not elsewhere classified: Secondary | ICD-10-CM | POA: Diagnosis present

## 2014-08-27 DIAGNOSIS — F419 Anxiety disorder, unspecified: Secondary | ICD-10-CM | POA: Diagnosis present

## 2014-08-27 DIAGNOSIS — E039 Hypothyroidism, unspecified: Secondary | ICD-10-CM | POA: Diagnosis present

## 2014-08-27 DIAGNOSIS — S0990XA Unspecified injury of head, initial encounter: Secondary | ICD-10-CM

## 2014-08-27 DIAGNOSIS — Z87891 Personal history of nicotine dependence: Secondary | ICD-10-CM

## 2014-08-27 DIAGNOSIS — W010XXA Fall on same level from slipping, tripping and stumbling without subsequent striking against object, initial encounter: Secondary | ICD-10-CM | POA: Diagnosis present

## 2014-08-27 DIAGNOSIS — I10 Essential (primary) hypertension: Secondary | ICD-10-CM | POA: Diagnosis present

## 2014-08-27 DIAGNOSIS — M1990 Unspecified osteoarthritis, unspecified site: Secondary | ICD-10-CM | POA: Diagnosis present

## 2014-08-27 DIAGNOSIS — N189 Chronic kidney disease, unspecified: Secondary | ICD-10-CM | POA: Diagnosis present

## 2014-08-27 DIAGNOSIS — I129 Hypertensive chronic kidney disease with stage 1 through stage 4 chronic kidney disease, or unspecified chronic kidney disease: Secondary | ICD-10-CM | POA: Diagnosis present

## 2014-08-27 MED ORDER — TETANUS-DIPHTH-ACELL PERTUSSIS 5-2.5-18.5 LF-MCG/0.5 IM SUSP
0.5000 mL | Freq: Once | INTRAMUSCULAR | Status: AC
  Administered 2014-08-28: 0.5 mL via INTRAMUSCULAR
  Filled 2014-08-27: qty 0.5

## 2014-08-27 NOTE — ED Notes (Signed)
Bed: WA07 Expected date:  Expected time:  Means of arrival:  Comments: EMS fall, head lac, nausea

## 2014-08-27 NOTE — ED Notes (Signed)
According to EMS, pt experience GFL aprox 1hr ago. Pt denies loosing consciousness. Pt denies pain.   Vitals BP 166/95 Pulse 100 SOPO2  0/10

## 2014-08-28 ENCOUNTER — Emergency Department (HOSPITAL_COMMUNITY): Payer: Medicare (Managed Care)

## 2014-08-28 ENCOUNTER — Other Ambulatory Visit (HOSPITAL_COMMUNITY): Payer: Self-pay

## 2014-08-28 DIAGNOSIS — E222 Syndrome of inappropriate secretion of antidiuretic hormone: Secondary | ICD-10-CM | POA: Diagnosis present

## 2014-08-28 DIAGNOSIS — E039 Hypothyroidism, unspecified: Secondary | ICD-10-CM | POA: Diagnosis not present

## 2014-08-28 DIAGNOSIS — N179 Acute kidney failure, unspecified: Secondary | ICD-10-CM | POA: Diagnosis not present

## 2014-08-28 DIAGNOSIS — E878 Other disorders of electrolyte and fluid balance, not elsewhere classified: Secondary | ICD-10-CM | POA: Diagnosis present

## 2014-08-28 DIAGNOSIS — Z7982 Long term (current) use of aspirin: Secondary | ICD-10-CM | POA: Diagnosis not present

## 2014-08-28 DIAGNOSIS — N189 Chronic kidney disease, unspecified: Secondary | ICD-10-CM | POA: Diagnosis not present

## 2014-08-28 DIAGNOSIS — S0101XA Laceration without foreign body of scalp, initial encounter: Secondary | ICD-10-CM | POA: Diagnosis not present

## 2014-08-28 DIAGNOSIS — E871 Hypo-osmolality and hyponatremia: Secondary | ICD-10-CM | POA: Diagnosis present

## 2014-08-28 DIAGNOSIS — M1990 Unspecified osteoarthritis, unspecified site: Secondary | ICD-10-CM | POA: Diagnosis not present

## 2014-08-28 DIAGNOSIS — F419 Anxiety disorder, unspecified: Secondary | ICD-10-CM | POA: Diagnosis not present

## 2014-08-28 DIAGNOSIS — R410 Disorientation, unspecified: Secondary | ICD-10-CM | POA: Diagnosis present

## 2014-08-28 DIAGNOSIS — I10 Essential (primary) hypertension: Secondary | ICD-10-CM | POA: Diagnosis not present

## 2014-08-28 DIAGNOSIS — I129 Hypertensive chronic kidney disease with stage 1 through stage 4 chronic kidney disease, or unspecified chronic kidney disease: Secondary | ICD-10-CM | POA: Diagnosis not present

## 2014-08-28 DIAGNOSIS — W010XXA Fall on same level from slipping, tripping and stumbling without subsequent striking against object, initial encounter: Secondary | ICD-10-CM | POA: Diagnosis not present

## 2014-08-28 DIAGNOSIS — Z882 Allergy status to sulfonamides status: Secondary | ICD-10-CM | POA: Diagnosis not present

## 2014-08-28 DIAGNOSIS — Z87891 Personal history of nicotine dependence: Secondary | ICD-10-CM | POA: Diagnosis not present

## 2014-08-28 LAB — BASIC METABOLIC PANEL
ANION GAP: 10 (ref 5–15)
Anion gap: 8 (ref 5–15)
Anion gap: 13 (ref 5–15)
Anion gap: 9 (ref 5–15)
BUN: 28 mg/dL — ABNORMAL HIGH (ref 6–20)
BUN: 26 mg/dL — AB (ref 6–20)
BUN: 25 mg/dL — ABNORMAL HIGH (ref 6–20)
BUN: 25 mg/dL — ABNORMAL HIGH (ref 6–20)
CALCIUM: 8.9 mg/dL (ref 8.9–10.3)
CHLORIDE: 83 mmol/L — AB (ref 101–111)
CHLORIDE: 86 mmol/L — AB (ref 101–111)
CO2: 18 mmol/L — AB (ref 22–32)
CO2: 19 mmol/L — AB (ref 22–32)
CO2: 21 mmol/L — ABNORMAL LOW (ref 22–32)
CO2: 21 mmol/L — AB (ref 22–32)
CREATININE: 1.89 mg/dL — AB (ref 0.44–1.00)
CREATININE: 1.8 mg/dL — AB (ref 0.44–1.00)
Calcium: 8.8 mg/dL — ABNORMAL LOW (ref 8.9–10.3)
Calcium: 8.3 mg/dL — ABNORMAL LOW (ref 8.9–10.3)
Calcium: 8.4 mg/dL — ABNORMAL LOW (ref 8.9–10.3)
Chloride: 81 mmol/L — ABNORMAL LOW (ref 101–111)
Chloride: 81 mmol/L — ABNORMAL LOW (ref 101–111)
Creatinine, Ser: 1.87 mg/dL — ABNORMAL HIGH (ref 0.44–1.00)
Creatinine, Ser: 1.72 mg/dL — ABNORMAL HIGH (ref 0.44–1.00)
GFR calc Af Amer: 26 mL/min — ABNORMAL LOW (ref >60)
GFR calc Af Amer: 26 mL/min — ABNORMAL LOW (ref >60)
GFR calc Af Amer: 27 mL/min — ABNORMAL LOW (ref >60)
GFR calc Af Amer: 29 mL/min — ABNORMAL LOW (ref >60)
GFR calc non Af Amer: 22 mL/min — ABNORMAL LOW (ref >60)
GFR calc non Af Amer: 23 mL/min — ABNORMAL LOW (ref >60)
GFR calc non Af Amer: 25 mL/min — ABNORMAL LOW (ref >60)
GFR, EST NON AFRICAN AMERICAN: 24 mL/min — AB (ref >60)
GLUCOSE: 124 mg/dL — AB (ref 65–99)
GLUCOSE: 107 mg/dL — AB (ref 65–99)
GLUCOSE: 107 mg/dL — AB (ref 65–99)
Glucose, Bld: 88 mg/dL (ref 65–99)
POTASSIUM: 5.1 mmol/L (ref 3.5–5.1)
POTASSIUM: 4.4 mmol/L (ref 3.5–5.1)
Potassium: 5.1 mmol/L (ref 3.5–5.1)
Potassium: 4.8 mmol/L (ref 3.5–5.1)
SODIUM: 116 mmol/L — AB (ref 135–145)
Sodium: 107 mmol/L — CL (ref 135–145)
Sodium: 113 mmol/L — CL (ref 135–145)
Sodium: 114 mmol/L — CL (ref 135–145)

## 2014-08-28 LAB — CBC WITH DIFFERENTIAL/PLATELET
Basophils Absolute: 0 10*3/uL (ref 0.0–0.1)
Basophils Relative: 0 % (ref 0–1)
EOS ABS: 0 10*3/uL (ref 0.0–0.7)
EOS PCT: 0 % (ref 0–5)
HCT: 31.7 % — ABNORMAL LOW (ref 36.0–46.0)
Hemoglobin: 11.6 g/dL — ABNORMAL LOW (ref 12.0–15.0)
Lymphocytes Relative: 14 % (ref 12–46)
Lymphs Abs: 1.9 10*3/uL (ref 0.7–4.0)
MCH: 29.8 pg (ref 26.0–34.0)
MCHC: 36.6 g/dL — AB (ref 30.0–36.0)
MCV: 81.5 fL (ref 78.0–100.0)
MONO ABS: 0.7 10*3/uL (ref 0.1–1.0)
MONOS PCT: 5 % (ref 3–12)
Neutro Abs: 10.6 10*3/uL — ABNORMAL HIGH (ref 1.7–7.7)
Neutrophils Relative %: 80 % — ABNORMAL HIGH (ref 43–77)
Platelets: 253 10*3/uL (ref 150–400)
RBC: 3.89 MIL/uL (ref 3.87–5.11)
RDW: 12.4 % (ref 11.5–15.5)
WBC: 13.2 10*3/uL — ABNORMAL HIGH (ref 4.0–10.5)

## 2014-08-28 LAB — CBC
HCT: 29.4 % — ABNORMAL LOW (ref 36.0–46.0)
Hemoglobin: 10.7 g/dL — ABNORMAL LOW (ref 12.0–15.0)
MCH: 29.2 pg (ref 26.0–34.0)
MCHC: 36.4 g/dL — ABNORMAL HIGH (ref 30.0–36.0)
MCV: 80.3 fL (ref 78.0–100.0)
PLATELETS: 225 10*3/uL (ref 150–400)
RBC: 3.66 MIL/uL — ABNORMAL LOW (ref 3.87–5.11)
RDW: 12.3 % (ref 11.5–15.5)
WBC: 13.4 10*3/uL — ABNORMAL HIGH (ref 4.0–10.5)

## 2014-08-28 LAB — URINALYSIS, ROUTINE W REFLEX MICROSCOPIC
BILIRUBIN URINE: NEGATIVE
Glucose, UA: NEGATIVE mg/dL
Hgb urine dipstick: NEGATIVE
Ketones, ur: NEGATIVE mg/dL
Leukocytes, UA: NEGATIVE
Nitrite: NEGATIVE
PROTEIN: NEGATIVE mg/dL
SPECIFIC GRAVITY, URINE: 1.01 (ref 1.005–1.030)
Urobilinogen, UA: 0.2 mg/dL (ref 0.0–1.0)
pH: 6 (ref 5.0–8.0)

## 2014-08-28 LAB — MRSA PCR SCREENING: MRSA by PCR: NEGATIVE

## 2014-08-28 LAB — COMPREHENSIVE METABOLIC PANEL
ALT: 25 U/L (ref 14–54)
AST: 41 U/L (ref 15–41)
Albumin: 4.2 g/dL (ref 3.5–5.0)
Alkaline Phosphatase: 57 U/L (ref 38–126)
Anion gap: 14 (ref 5–15)
BUN: 26 mg/dL — ABNORMAL HIGH (ref 6–20)
CALCIUM: 9.4 mg/dL (ref 8.9–10.3)
CO2: 20 mmol/L — AB (ref 22–32)
CREATININE: 1.82 mg/dL — AB (ref 0.44–1.00)
Chloride: 77 mmol/L — ABNORMAL LOW (ref 101–111)
GFR calc Af Amer: 27 mL/min — ABNORMAL LOW (ref >60)
GFR, EST NON AFRICAN AMERICAN: 23 mL/min — AB (ref >60)
Glucose, Bld: 192 mg/dL — ABNORMAL HIGH (ref 65–99)
Potassium: 4.7 mmol/L (ref 3.5–5.1)
SODIUM: 111 mmol/L — AB (ref 135–145)
TOTAL PROTEIN: 6.6 g/dL (ref 6.5–8.1)
Total Bilirubin: 1.4 mg/dL — ABNORMAL HIGH (ref 0.3–1.2)

## 2014-08-28 LAB — SODIUM: Sodium: 113 mmol/L — CL (ref 135–145)

## 2014-08-28 LAB — URIC ACID: Uric Acid, Serum: 5.3 mg/dL (ref 2.3–6.6)

## 2014-08-28 LAB — OSMOLALITY, URINE: OSMOLALITY UR: 325 mosm/kg — AB (ref 390–1090)

## 2014-08-28 LAB — OSMOLALITY: Osmolality: 243 mOsm/kg — ABNORMAL LOW (ref 275–300)

## 2014-08-28 LAB — SODIUM, URINE, RANDOM
Sodium, Ur: 34 mmol/L
Sodium, Ur: 29 mmol/L

## 2014-08-28 LAB — CREATININE, URINE, RANDOM: CREATININE, URINE: 113.54 mg/dL

## 2014-08-28 LAB — CBG MONITORING, ED: Glucose-Capillary: 223 mg/dL — ABNORMAL HIGH (ref 65–99)

## 2014-08-28 MED ORDER — SODIUM CHLORIDE 0.9 % IJ SOLN
10.0000 mL | INTRAMUSCULAR | Status: DC | PRN
  Administered 2014-08-31: 30 mL

## 2014-08-28 MED ORDER — FUROSEMIDE 20 MG PO TABS
20.0000 mg | ORAL_TABLET | Freq: Once | ORAL | Status: AC
  Administered 2014-08-28: 20 mg via ORAL
  Filled 2014-08-28: qty 1

## 2014-08-28 MED ORDER — HALOPERIDOL LACTATE 5 MG/ML IJ SOLN: 1.0000 mg | Freq: Four times a day (QID) | INTRAMUSCULAR | Status: DC | PRN

## 2014-08-28 MED ORDER — SODIUM CHLORIDE 0.9 % IV BOLUS (SEPSIS)
500.0000 mL | Freq: Once | INTRAVENOUS | Status: DC
  Administered 2014-08-28: 500 mL via INTRAVENOUS

## 2014-08-28 MED ORDER — ONDANSETRON HCL 4 MG/2ML IJ SOLN
4.0000 mg | Freq: Four times a day (QID) | INTRAMUSCULAR | Status: DC | PRN
  Administered 2014-08-28: 4 mg via INTRAVENOUS

## 2014-08-28 MED ORDER — ASPIRIN 325 MG PO TABS
325.0000 mg | ORAL_TABLET | Freq: Every day | ORAL | Status: DC
  Administered 2014-08-28 – 2014-08-31 (×4): 325 mg via ORAL
  Filled 2014-08-28 (×5): qty 1

## 2014-08-28 MED ORDER — SODIUM CHLORIDE 3 % IV SOLN
INTRAVENOUS | Status: DC
  Administered 2014-08-28: 36 mL/h via INTRAVENOUS
  Filled 2014-08-28 (×3): qty 500

## 2014-08-28 MED ORDER — LOSARTAN POTASSIUM 50 MG PO TABS: 50.0000 mg | ORAL_TABLET | Freq: Two times a day (BID) | ORAL | Status: DC

## 2014-08-28 MED ORDER — ONDANSETRON 4 MG PO TBDP
4.0000 mg | ORAL_TABLET | Freq: Once | ORAL | Status: AC
  Administered 2014-08-28: 4 mg via ORAL
  Filled 2014-08-28: qty 1

## 2014-08-28 MED ORDER — CLONIDINE HCL 0.1 MG PO TABS: 0.2000 mg | ORAL_TABLET | Freq: Three times a day (TID) | ORAL | Status: DC

## 2014-08-28 MED ORDER — METOPROLOL TARTRATE 25 MG PO TABS: 50.0000 mg | ORAL_TABLET | Freq: Two times a day (BID) | ORAL | Status: DC

## 2014-08-28 MED ORDER — ONDANSETRON HCL 4 MG/2ML IJ SOLN
INTRAMUSCULAR | Status: AC
  Filled 2014-08-28: qty 2

## 2014-08-28 MED ORDER — SODIUM CHLORIDE 0.9 % IV BOLUS (SEPSIS)
500.0000 mL | Freq: Once | INTRAVENOUS | Status: AC
  Administered 2014-08-28: 500 mL via INTRAVENOUS

## 2014-08-28 MED ORDER — HYDRALAZINE HCL 20 MG/ML IJ SOLN
10.0000 mg | Freq: Four times a day (QID) | INTRAMUSCULAR | Status: DC | PRN
  Administered 2014-08-29 – 2014-08-31 (×3): 10 mg via INTRAVENOUS
  Filled 2014-08-28 (×3): qty 1

## 2014-08-28 MED ORDER — CLONIDINE HCL 0.1 MG PO TABS
0.2000 mg | ORAL_TABLET | Freq: Once | ORAL | Status: AC
  Administered 2014-08-28: 0.2 mg via ORAL
  Filled 2014-08-28: qty 2

## 2014-08-28 MED ORDER — SODIUM CHLORIDE 0.9 % IJ SOLN
10.0000 mL | Freq: Two times a day (BID) | INTRAMUSCULAR | Status: DC
Start: 2014-08-28 — End: 2014-09-01
  Administered 2014-08-28 – 2014-08-30 (×4): 10 mL
  Administered 2014-08-31: 30 mL

## 2014-08-28 MED ORDER — SODIUM CHLORIDE 0.9 % IV SOLN
INTRAVENOUS | Status: DC
  Administered 2014-08-28: 04:00:00 via INTRAVENOUS

## 2014-08-28 MED ORDER — LEVOTHYROXINE SODIUM 100 MCG PO TABS
100.0000 ug | ORAL_TABLET | Freq: Every day | ORAL | Status: DC
  Administered 2014-08-28 – 2014-09-01 (×5): 100 ug via ORAL
  Filled 2014-08-28: qty 2
  Filled 2014-08-28 (×3): qty 1
  Filled 2014-08-28: qty 2
  Filled 2014-08-28: qty 1
  Filled 2014-08-28: qty 2
  Filled 2014-08-28 (×2): qty 1

## 2014-08-28 NOTE — Care Management Note (Signed)
Case Management Note  Patient Details  Name: Bethany Blake MRN: [id number] Date of Birth: Jan 10, 1922  Subjective/Objective:                 Hypotension, ams and hyponatremia   Action/Plan:   Expected Discharge Date:                [id number]  Expected Discharge Plan:  Home/Self Care  In-House Referral:  NA, Financial Counselor  Discharge planning Services  CM Consult, Indigent [facility] Clinic  Post Acute Care Choice:  NA Choice offered to:  NA  DME Arranged:    DME Agency:     HH Arranged:    HH Agency:     Status of Service:     Medicare Important Message Given:    Date Medicare IM Given:    Medicare IM give by:    Date Additional Medicare IM Given:    Additional Medicare Important Message give by:     If discussed at Long Length of Stay Meetings, dates discussed:    Additional Comments:  Golda Acre, RN 08/28/2014, 10:50 AM

## 2014-08-28 NOTE — Progress Notes (Signed)
Date:  Aug 28, 2014 U.R. performed for needs and level of care. Will continue to follow for Case Management needs.  Jarel Cuadra, RN, BSN, CCM   [phone] 

## 2014-08-28 NOTE — ED Provider Notes (Signed)
CSN: [id number]     Arrival date & time 08/27/14  2304 History   First MD Initiated Contact with Patient 08/27/14 2343     Chief Complaint  Patient presents with  . Fall  . Head Laceration     (Consider location/radiation/quality/duration/timing/severity/associated sxs/prior Treatment) HPI Patient lives at home with husband. Husband was trying to help the patient stand up and she fell over and hit her head on a piece of furniture. There was no loss of consciousness. She denies any pain at this point. She denies any posterior neck pain. Unknown last tetanus.  Past Medical History  Diagnosis Date  . Hypertension   . Hypothyroidism   . Hyponatremia   . Hypokalemia   . Arthritis   . Anxiety    Past Surgical History  Procedure Laterality Date  . Shoulder surgery    . Wrist surgery     Family History  Problem Relation Age of Onset  . Colon cancer Neg Hx   . Colon polyps Neg Hx   . Rectal cancer Neg Hx   . Stomach cancer Neg Hx    History  Substance Use Topics  . Smoking status: Former Games developer  . Smokeless tobacco: Never Used  . Alcohol Use: No   OB History    No data available     Review of Systems  Unable to perform ROS Constitutional: Negative for fever and chills.  Respiratory: Negative for shortness of breath.   Cardiovascular: Negative for chest pain.  Gastrointestinal: Negative for nausea, vomiting and abdominal pain.  Musculoskeletal: Negative for back pain, neck pain and neck stiffness.  Skin: Positive for wound.  Neurological: Negative for dizziness, syncope, weakness, light-headedness, numbness and headaches.  All other systems reviewed and are negative.     Allergies  Sulfa antibiotics  Home Medications   Prior to Admission medications   Medication Sig Start Date End Date Taking? Authorizing Provider  aspirin 325 MG tablet Take 325 mg by mouth daily.   Yes Historical Provider, MD  cloNIDine (CATAPRES) 0.2 MG tablet Take 0.2 mg by mouth 3 (three)  times daily.   Yes Historical Provider, MD  docusate sodium (COLACE) 100 MG capsule Take 100 mg by mouth 3 (three) times daily as needed for mild constipation.    Yes Historical Provider, MD  levothyroxine (SYNTHROID, LEVOTHROID) 100 MCG tablet Take 100 mcg by mouth daily.   Yes Historical Provider, MD  losartan (COZAAR) 50 MG tablet Take 1 tablet by mouth 2 (two) times daily. 06/26/14  Yes Historical Provider, MD  spironolactone (ALDACTONE) 25 MG tablet Take 1 tablet (25 mg total) by mouth 2 (two) times daily. 08/16/11  Yes Rachael Fee, MD   BP 93/50 mmHg  Pulse 98  Temp(Src) 97.8 F (36.6 C) (Oral)  Resp 18  Ht 5\' 1"  (1.549 m)  Wt 138 lb 7.2 oz (62.8 kg)  BMI 26.17 kg/m2  SpO2 97% Physical Exam  Constitutional: She is oriented to person, place, and time. She appears well-developed and well-nourished. No distress.  HENT:  Head: Normocephalic.  Mouth/Throat: Oropharynx is clear and moist.  1 cm laceration to the vertex of the scalp. Minimal amount of active bleeding.  Eyes: EOM are normal. Pupils are equal, round, and reactive to light.  Neck: Normal range of motion. Neck supple.  The posterior cervical tenderness to palpation.  Cardiovascular: Normal rate and regular rhythm.   Pulmonary/Chest: Effort normal and breath sounds normal. No respiratory distress. She has no wheezes. She has no rales.  She exhibits no tenderness.  Abdominal: Soft. Bowel sounds are normal. She exhibits no distension. There is no tenderness. There is no rebound and no guarding.  Musculoskeletal: Normal range of motion. She exhibits no edema or tenderness.  Neurological: She is oriented to person, place, and time.  The patient appears mildly drowsy. She is moving all extremities. There is no focal deficit. Sensation is grossly intact.  Skin: Skin is warm and dry. No rash noted. No erythema.  Psychiatric: She has a normal mood and affect. Her behavior is normal.  Nursing note and vitals reviewed.   ED Course   LACERATION REPAIR Date/Time: 08/28/2014 2:00 AM Performed by: Loren Racer Authorized by: Ranae Palms, Stephani Janak Body area: head/neck Location details: scalp Laceration length: 1 cm Foreign bodies: no foreign bodies Irrigation solution: saline Amount of cleaning: standard Skin closure: staples Number of sutures: 1 Technique: simple Approximation difficulty: simple Patient tolerance: Patient tolerated the procedure well with no immediate complications   (including critical care time) Labs Review Labs Reviewed  CBC WITH DIFFERENTIAL/PLATELET - Abnormal; Notable for the following:    WBC 13.2 (*)    Hemoglobin 11.6 (*)    HCT 31.7 (*)    MCHC 36.6 (*)    Neutrophils Relative % 80 (*)    Neutro Abs 10.6 (*)    All other components within normal limits  COMPREHENSIVE METABOLIC PANEL - Abnormal; Notable for the following:    Sodium 111 (*)    Chloride 77 (*)    CO2 20 (*)    Glucose, Bld 192 (*)    BUN 26 (*)    Creatinine, Ser 1.82 (*)    Total Bilirubin 1.4 (*)    GFR calc non Af Amer 23 (*)    GFR calc Af Amer 27 (*)    All other components within normal limits  CBC - Abnormal; Notable for the following:    WBC 13.4 (*)    RBC 3.66 (*)    Hemoglobin 10.7 (*)    HCT 29.4 (*)    MCHC 36.4 (*)    All other components within normal limits  BASIC METABOLIC PANEL - Abnormal; Notable for the following:    Sodium 107 (*)    Chloride 81 (*)    CO2 18 (*)    Glucose, Bld 124 (*)    BUN 28 (*)    Creatinine, Ser 1.89 (*)    Calcium 8.8 (*)    GFR calc non Af Amer 22 (*)    GFR calc Af Amer 26 (*)    All other components within normal limits  CBG MONITORING, ED - Abnormal; Notable for the following:    Glucose-Capillary 223 (*)    All other components within normal limits  MRSA PCR SCREENING  URINALYSIS, ROUTINE W REFLEX MICROSCOPIC  SODIUM, URINE, RANDOM  CREATININE, URINE, RANDOM  OSMOLALITY, URINE    Imaging Review Dg Chest 1 View  08/28/2014   CLINICAL DATA:   Hyponatremia.  EXAM: CHEST  1 VIEW  COMPARISON:  Frontal and lateral views 01/03/2011  FINDINGS: The patient is rotated. The mediastinal contours and mild cardiomegaly are unchanged. There is elevation of left hemidiaphragm with adjacent atelectasis or scarring. No consolidation to suggest pneumonia. No pulmonary edema, pleural effusion or pneumothorax. Chronic change about the right shoulder.  IMPRESSION: Stable mild cardiomegaly. No acute pulmonary process or congestive heart failure.   Electronically Signed   By: Rubye Oaks M.D.   On: 08/28/2014 03:46   Ct Head Wo Contrast  08/28/2014   CLINICAL DATA:  Status post fall, with posterior head laceration. Initial encounter.  EXAM: CT HEAD WITHOUT CONTRAST  TECHNIQUE: Contiguous axial images were obtained from the base of the skull through the vertex without intravenous contrast.  COMPARISON:  CT of the head performed 01/03/2011  FINDINGS: There is no evidence of acute infarction, mass lesion, or intra- or extra-axial hemorrhage on CT.  Prominence of the sulci suggests mild to moderate cortical volume loss. Mild periventricular white matter change likely reflects small vessel ischemic microangiopathy. Mild cerebellar atrophy is noted. Chronic ischemic change is noted at the external capsule bilaterally.  The brainstem and fourth ventricle are within normal limits. The basal ganglia are unremarkable in appearance. The cerebral hemispheres demonstrate grossly normal gray-white differentiation. No mass effect or midline shift is seen.  There is no evidence of fracture; visualized osseous structures are unremarkable in appearance. The visualized portions of the orbits are within normal limits. The paranasal sinuses and mastoid air cells are well-aerated. The soft tissues are not well assessed due to overlying bandages.  IMPRESSION: 1. No evidence of traumatic intracranial injury or fracture. 2. Mild to moderate cortical volume loss and scattered small vessel  ischemic microangiopathy. Chronic ischemic change at the external capsule bilaterally. 3. Soft tissue laceration not well assessed due to overlying bandages.   Electronically Signed   By: Roanna Raider M.D.   On: 08/28/2014 00:33     EKG Interpretation None      MDM   Final diagnoses:  Closed head injury, initial encounter  Scalp laceration, initial encounter  Hyponatremia     Patient with borderline hypertension in the emergency department. At one point had loss of consciousness while getting up to the restroom. Noted to be profoundly hyponatremic. IV fluids initiated. Discussed with [facility] hospitalists and will admit to step down bed.   Loren Racer, MD 08/28/14 [phone]

## 2014-08-28 NOTE — ED Notes (Signed)
Attempted to call report. Informed CN states they're not in a position to take patient, CN will call AC and call back with update.

## 2014-08-28 NOTE — ED Notes (Signed)
Pt sts she was not able to urinate at this time.

## 2014-08-28 NOTE — Consult Note (Signed)
Urology Consult  CC: Referring physician: P. Thedore Mins, MD Reason for referral: Foley catheter placement  History of Present Illness: Bethany Blake is a 81 year old female who has been admitted to the hospital for hyponatremia. She underwent in and out catheterization in the emergency room however today attempts at passing a Foley catheter for a urine specimen were unsuccessful. 3 attempts have been made unsuccessfully and I was contacted regarding assistance with catheter placement. The patient, her husband and daughter were in the room and I was able to obtain little useful information from the patient due to some apparent confusion however she did indicate that she has not had difficulty urinating to the best of her knowledge and the family confirms this. She has no previous urologic history. The inability to place the Foley catheter would be of mild severity with no modifying factors or associated signs and symptoms.   Past Medical History  Diagnosis Date  . Hypertension   . Hypothyroidism   . Hyponatremia   . Hypokalemia   . Arthritis   . Anxiety    Past Surgical History  Procedure Laterality Date  . Shoulder surgery    . Wrist surgery      Medications:  Prior to Admission:  Prescriptions prior to admission  Medication Sig Dispense Refill Last Dose  . aspirin 325 MG tablet Take 325 mg by mouth daily.   Past Week at Unknown time  . cloNIDine (CATAPRES) 0.2 MG tablet Take 0.2 mg by mouth 3 (three) times daily.   Past Week at Unknown time  . docusate sodium (COLACE) 100 MG capsule Take 100 mg by mouth 3 (three) times daily as needed for mild constipation.    Past Week at Unknown time  . levothyroxine (SYNTHROID, LEVOTHROID) 100 MCG tablet Take 100 mcg by mouth daily.   Past Month at Unknown time  . losartan (COZAAR) 50 MG tablet Take 1 tablet by mouth 2 (two) times daily.   Past Week at Unknown time  . spironolactone (ALDACTONE) 25 MG tablet Take 1 tablet (25 mg total) by mouth 2 (two)  times daily.   Past Week at Unknown time    Allergies:  Allergies  Allergen Reactions  . Sulfa Antibiotics Rash    Family History  Problem Relation Age of Onset  . Colon cancer Neg Hx   . Colon polyps Neg Hx   . Rectal cancer Neg Hx   . Stomach cancer Neg Hx     Social History:  reports that she has quit smoking. She has never used smokeless tobacco. She reports that she does not drink alcohol or use illicit drugs.  Review of Systems: Pertinent items are noted in HPI. A comprehensive review of systems was negative except as above  Physical Exam:  Vital signs in last 24 hours: Temp:  [97.8 F (36.6 C)-98.3 F (36.8 C)] 98.3 F (36.8 C) (05/20 0800) Pulse Rate:  [95-117] 105 (05/20 1200) Resp:  [14-22] 16 (05/20 1200) BP: (93-187)/(35-131) 161/131 mmHg (05/20 1000) SpO2:  [93 %-99 %] 96 % (05/20 1200) Weight:  [62.8 kg (138 lb 7.2 oz)] 62.8 kg (138 lb 7.2 oz) (05/20 0602) General appearance: alert and appears stated age Head: Normocephalic, without obvious abnormality, atraumatic Eyes: conjunctivae/corneas clear. EOM's intact.  Oropharynx: moist mucous membranes Neck: supple, symmetrical, trachea midline Resp: normal respiratory effort Cardio: regular rate and rhythm Back: symmetric, no curvature. ROM normal. No CVA tenderness. GI: soft, non-tender; bowel sounds normal; no masses,  no organomegaly Extremities: extremities normal, atraumatic, no  cyanosis or edema Skin: Skin color normal. No visible rashes or lesions Neurologic: Grossly normal  Laboratory Data:   Recent Labs  08/28/14 0026 08/28/14 0525  WBC 13.2* 13.4*  HGB 11.6* 10.7*  HCT 31.7* 29.4*   BMET  Recent Labs  08/28/14 0525 08/28/14 0900  NA 107* 113*  K 5.1 5.1  CL 81* 81*  CO2 18* 19*  GLUCOSE 124* 107*  BUN 28* 26*  CREATININE 1.89* 1.87*  CALCIUM 8.8* 8.9   No results for input(s): LABPT, INR in the last 72 hours. No results for input(s): LABURIN in the last 72 hours. Results for  orders placed or performed during the hospital encounter of 08/27/14  MRSA PCR Screening     Status: None   Collection Time: 08/28/14  5:35 AM  Result Value Ref Range Status   MRSA by PCR NEGATIVE NEGATIVE Final    Comment:        The GeneXpert MRSA Assay (FDA approved for NASAL specimens only), is one component of a comprehensive MRSA colonization surveillance program. It is not intended to diagnose MRSA infection nor to guide or monitor treatment for MRSA infections.    Creatinine:  Recent Labs  08/28/14 0026 08/28/14 0525 08/28/14 0900  CREATININE 1.82* 1.89* 1.87*    Imaging: Dg Chest 1 View  08/28/2014   CLINICAL DATA:  Hyponatremia.  EXAM: CHEST  1 VIEW  COMPARISON:  Frontal and lateral views 01/03/2011  FINDINGS: The patient is rotated. The mediastinal contours and mild cardiomegaly are unchanged. There is elevation of left hemidiaphragm with adjacent atelectasis or scarring. No consolidation to suggest pneumonia. No pulmonary edema, pleural effusion or pneumothorax. Chronic change about the right shoulder.  IMPRESSION: Stable mild cardiomegaly. No acute pulmonary process or congestive heart failure.   Electronically Signed   By: Rubye Oaks M.D.   On: 08/28/2014 03:46   Ct Head Wo Contrast  08/28/2014   CLINICAL DATA:  Status post fall, with posterior head laceration. Initial encounter.  EXAM: CT HEAD WITHOUT CONTRAST  TECHNIQUE: Contiguous axial images were obtained from the base of the skull through the vertex without intravenous contrast.  COMPARISON:  CT of the head performed 01/03/2011  FINDINGS: There is no evidence of acute infarction, mass lesion, or intra- or extra-axial hemorrhage on CT.  Prominence of the sulci suggests mild to moderate cortical volume loss. Mild periventricular white matter change likely reflects small vessel ischemic microangiopathy. Mild cerebellar atrophy is noted. Chronic ischemic change is noted at the external capsule bilaterally.  The  brainstem and fourth ventricle are within normal limits. The basal ganglia are unremarkable in appearance. The cerebral hemispheres demonstrate grossly normal gray-white differentiation. No mass effect or midline shift is seen.  There is no evidence of fracture; visualized osseous structures are unremarkable in appearance. The visualized portions of the orbits are within normal limits. The paranasal sinuses and mastoid air cells are well-aerated. The soft tissues are not well assessed due to overlying bandages.  IMPRESSION: 1. No evidence of traumatic intracranial injury or fracture. 2. Mild to moderate cortical volume loss and scattered small vessel ischemic microangiopathy. Chronic ischemic change at the external capsule bilaterally. 3. Soft tissue laceration not well assessed due to overlying bandages.   Electronically Signed   By: Roanna Raider M.D.   On: 08/28/2014 00:33   Procedure: I informed the patient of the planned placement of a Foley catheter. She was then moved to the frog-leg position and I used Betadine to sterilely prep the introitus  and area of the urethra. I then inserted my index finger into the vagina and passed the catheter over the top of my finger and while doing this I was easily able to palpate the urethral meatus and slid the catheter into the bladder without difficulty. The balloon was filled and clear urine returned.   Impression/Assessment:  Difficult Foley catheter placement: The patient has significant narrowing of her introitus which made visualization of her urethral meatus very difficult. She has no urethral stricture or abnormality of the urethra as the 16 French catheter passed easily into the bladder.   Plan:  I have recommended leaving the Foley catheter in for now and it can be removed once monitoring of urine output is no longer necessary.  Follow-up with me will be as needed.  Garnett Farm 08/28/2014, 12:21 PM

## 2014-08-28 NOTE — Progress Notes (Signed)
CRITICAL VALUE ALERT  Critical value received: Sodium 113  Date of notification:  08/28/14  Time of notification:  0940  Critical value read back yes  Nurse who received alert:  Josephina Shih, RN  RN notified (primary nurse):  Ok Edwards, RN  Time of first page:    MD notified (2nd page):  Time of second page:

## 2014-08-28 NOTE — Progress Notes (Signed)
Peripherally Inserted Central Catheter/Midline Placement  The IV Nurse has discussed with the patient and/or persons authorized to consent for the patient, the purpose of this procedure and the potential benefits and risks involved with this procedure.  The benefits include less needle sticks, lab draws from the catheter and patient may be discharged home with the catheter.  Risks include, but not limited to, infection, bleeding, blood clot (thrombus formation), and puncture of an artery; nerve damage and irregular heat beat.  Alternatives to this procedure were also discussed.  PICC/Midline Placement Documentation  PICC / Midline Double Lumen 08/28/14 PICC Left Basilic 45 cm 0 cm (Active)  Indication for Insertion or Continuance of Line Vasoactive infusions 08/28/2014  5:49 PM  Exposed Catheter (cm) 0 cm 08/28/2014  5:49 PM  Lumen #1 Status Flushed;Saline locked;Blood return noted 08/28/2014  5:49 PM  Lumen #2 Status Flushed;Saline locked;Blood return noted 08/28/2014  5:49 PM  Dressing Type Transparent 08/28/2014  5:49 PM  Dressing Intervention New dressing 08/28/2014  5:49 PM  Dressing Change Due 09/04/14 08/28/2014  5:49 PM       Ethelda Chick 08/28/2014, 5:50 PM

## 2014-08-28 NOTE — Consult Note (Signed)
Renal Service Consult Note [facility]  Bethany Blake 08/28/2014 Bethany Blake D Requesting Physician:  Dr Thedore Mins  Reason for Consult:  Hyponatremia HPI: The patient is a 81 y.o. year-old with hx of HTN, low T4, hyponatremia presenting to ED yesterday with AMS. Serum Na was low at 111. She rec'd NS IV overnight and this am Na was down to 107.  UNa ~37, UOsm is pending. Pt / family deny recent N/V or diarrhea. Takes ARB and aldactone. Also clonidine and synthroid.  No SSRI or thiazide. Creat 1.82, it was 0.72 back in 2012.  Patient confused. Denies SOB, CP or abd pain.    Past Medical History  Past Medical History  Diagnosis Date  . Hypertension   . Hypothyroidism   . Hyponatremia   . Hypokalemia   . Arthritis   . Anxiety    Past Surgical History  Past Surgical History  Procedure Laterality Date  . Shoulder surgery    . Wrist surgery     Family History  Family History  Problem Relation Age of Onset  . Colon cancer Neg Hx   . Colon polyps Neg Hx   . Rectal cancer Neg Hx   . Stomach cancer Neg Hx    Social History  reports that she has quit smoking. She has never used smokeless tobacco. She reports that she does not drink alcohol or use illicit drugs. Allergies  Allergies  Allergen Reactions  . Sulfa Antibiotics Rash   Home medications Prior to Admission medications   Medication Sig Start Date End Date Taking? Authorizing Provider  aspirin 325 MG tablet Take 325 mg by mouth daily.   Yes Historical Provider, MD  cloNIDine (CATAPRES) 0.2 MG tablet Take 0.2 mg by mouth 3 (three) times daily.   Yes Historical Provider, MD  docusate sodium (COLACE) 100 MG capsule Take 100 mg by mouth 3 (three) times daily as needed for mild constipation.    Yes Historical Provider, MD  levothyroxine (SYNTHROID, LEVOTHROID) 100 MCG tablet Take 100 mcg by mouth daily.   Yes Historical Provider, MD  losartan (COZAAR) 50 MG tablet Take 1 tablet by mouth 2 (two) times daily. 06/26/14   Yes Historical Provider, MD  spironolactone (ALDACTONE) 25 MG tablet Take 1 tablet (25 mg total) by mouth 2 (two) times daily. 08/16/11  Yes Rachael Fee, MD   Liver Function Tests  Recent Labs Lab 08/28/14 0026  AST 41  ALT 25  ALKPHOS 57  BILITOT 1.4*  PROT 6.6  ALBUMIN 4.2   No results for input(s): LIPASE, AMYLASE in the last 168 hours. CBC  Recent Labs Lab 08/28/14 0026 08/28/14 0525  WBC 13.2* 13.4*  NEUTROABS 10.6*  --   HGB 11.6* 10.7*  HCT 31.7* 29.4*  MCV 81.5 80.3  PLT 253 225   Basic Metabolic Panel  Recent Labs Lab 08/28/14 0026 08/28/14 0525 08/28/14 0900  NA 111* 107* 113*  K 4.7 5.1 5.1  CL 77* 81* 81*  CO2 20* 18* 19*  GLUCOSE 192* 124* 107*  BUN 26* 28* 26*  CREATININE 1.82* 1.89* 1.87*  CALCIUM 9.4 8.8* 8.9    Filed Vitals:   08/28/14 0900 08/28/14 1000 08/28/14 1100 08/28/14 1200  BP:  161/131  137/61  Pulse: 104 104 116 105  Temp:    98.3 F (36.8 C)  TempSrc:    Oral  Resp: 22 20 21 16   Height:      Weight:      SpO2: 93% 95% 93%  96%   Exam Elderly WF alert, no distress No rash, cyanosis or gangrene Sclera anicteric, throat clear No jvd Chest clear bilat RRR no MRG Abd soft, ntnd no ascites SKin turgor good No LE edema Neuro is alert, oriented to year and name, not place  UNa 29 UCr 113 UOsm pend   Assessment: 1. 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 Hypovolemic is another possibility which I feel is less likely but still possible. ARB and aldactone can contribute to low Na , continue off these meds.  Got worse with NS, plan 3% saline now given AMS. See orders.    2. AMS as above 3. HTN 4. Acute vs CRF 5. Debilitly   Plan- as above.   Bethany Moselle MD (pgr) [phone]    [phone] 08/28/2014, 2:41 PM

## 2014-08-28 NOTE — Plan of Care (Signed)
Problem: Consults Goal: Skin Care Protocol Initiated - if Braden Score 18 or less If consults are not indicated, leave blank or document N/A Outcome: Progressing Pt cont without any acute skin breakdown. Laceration on head from fall dry and staple intact.

## 2014-08-28 NOTE — Progress Notes (Signed)
Post- fall note: 2000  While giving report to the night shift, pt called out for help.  Pt was found on the floor, she denied hitting her head. No injuries noted and no changes in neuro status.  Pt stated that she wanted to walk to the bathroom to urinate.  Pt reminded that she had a foley catheter. Pt has made no attempts to get OOB the entire 12 hr shift, but has been watched closely due to abnormally low serum sodium which can cause confusion.  Pt. was assisted back into bed by four staff members, she continued to deny pain. Elray Mcgregor, NP was notified, no further tests are ordered at this time.  Close monitoring continued, bed alarm on and family updated on event.  Call bell remains with patient at all times.  Ok Edwards, RN

## 2014-08-28 NOTE — Plan of Care (Signed)
Patient admitted earlier today for confusion due to dehydration and hyponatremia.  Have ordered urine and serum osmolality, urine and serum uric acid, urine sodium. Initially sodium had fallen after normal saline, also has acute renal failure. Fractional excretion of sodium is around 0.4. This possibly is prerenal with dehydration, she was also on Aldactone. We'll request renal to see the patient. Repeat BMP and electrolytes studies are pending. Talked to PCPs office. 10 days ago her sodium was 123 and creatinine 1.3. She might require 3% normal saline. We'll request renal to evaluate and monitor. Head CT nonacute except for laceration, chest x-ray nonacute.

## 2014-08-28 NOTE — H&P (Addendum)
[facility] History and Physical  Bethany Blake IBB:[id number] DOB: [birth year] DOA: 08/27/2014  Referring physician: EDP PCP: Michiel Sites, MD   Chief Complaint: Fall   HPI: Bethany Blake is a 81 y.o. female who presents to the ED after a mechanical fall where she tripped over a piece of furniture at home this evening.  She did hit her head and suffer a head lac which was repaired with 1 staple in the ED.  Patient has been having increasing confusion over the past week or so.  She was seen by her doctor on 08/19/14, and noted to have "low sodium", how low her sodium was is unknown as the page with this lab finding on it is missing from what her family were able to find (they have the entire CBC, and most of the rest of the CMP, but sodium is missing, presumably on another page of the printout).  Per her family, her doctor advised that she "eat some pretzles", so presumably it was not severe at that time.   Review of Systems: Systems reviewed.  As above, otherwise negative  Past Medical History  Diagnosis Date  . Hypertension   . Hypothyroidism   . Hyponatremia   . Hypokalemia   . Arthritis   . Anxiety    Past Surgical History  Procedure Laterality Date  . Shoulder surgery    . Wrist surgery     Social History:  reports that she has quit smoking. She has never used smokeless tobacco. She reports that she does not drink alcohol or use illicit drugs.  Allergies  Allergen Reactions  . Sulfa Antibiotics Rash    Family History  Problem Relation Age of Onset  . Colon cancer Neg Hx   . Colon polyps Neg Hx   . Rectal cancer Neg Hx   . Stomach cancer Neg Hx      Prior to Admission medications   Medication Sig Start Date End Date Taking? Authorizing Provider  aspirin 325 MG tablet Take 325 mg by mouth daily.   Yes Historical Provider, MD  cloNIDine (CATAPRES) 0.2 MG tablet Take 0.2 mg by mouth 3 (three) times daily.   Yes Historical Provider, MD  docusate sodium (COLACE)  100 MG capsule Take 100 mg by mouth 3 (three) times daily as needed for mild constipation.    Yes Historical Provider, MD  levothyroxine (SYNTHROID, LEVOTHROID) 100 MCG tablet Take 100 mcg by mouth daily.   Yes Historical Provider, MD  losartan (COZAAR) 50 MG tablet Take 1 tablet by mouth 2 (two) times daily. 06/26/14  Yes Historical Provider, MD  spironolactone (ALDACTONE) 25 MG tablet Take 1 tablet (25 mg total) by mouth 2 (two) times daily. 08/16/11  Yes Rachael Fee, MD   Physical Exam: Filed Vitals:   08/28/14 0300  BP: 96/35  Pulse: 100  Temp:   Resp: 20    BP 96/35 mmHg  Pulse 100  Temp(Src) 98.3 F (36.8 C) (Oral)  Resp 20  SpO2 99%  General Appearance:    Awake, confused, no distress, appears stated age  Head:    Normocephalic, atraumatic  Eyes:    PERRL, EOMI, sclera non-icteric        Nose:   Nares without drainage or epistaxis. Mucosa, turbinates normal  Throat:   Moist mucous membranes. Oropharynx without erythema or exudate.  Neck:   Supple. No carotid bruits.  No thyromegaly.  No lymphadenopathy.   Back:     No CVA tenderness, no spinal  tenderness  Lungs:     Clear to auscultation bilaterally, without wheezes, rhonchi or rales  Chest wall:    No tenderness to palpitation  Heart:    Regular rate and rhythm without murmurs, gallops, rubs  Abdomen:     Soft, non-tender, nondistended, normal bowel sounds, no organomegaly  Genitalia:    deferred  Rectal:    deferred  Extremities:   No clubbing, cyanosis or edema.  Pulses:   2+ and symmetric all extremities  Skin:   Skin color, texture, turgor normal, no rashes or lesions  Lymph nodes:   Cervical, supraclavicular, and axillary nodes normal  Neurologic:   CNII-XII intact. Normal strength, sensation and reflexes      throughout    Labs on Admission:  Basic Metabolic Panel:  Recent Labs Lab 08/28/14 0026  NA 111*  K 4.7  CL 77*  CO2 20*  GLUCOSE 192*  BUN 26*  CREATININE 1.82*  CALCIUM 9.4   Liver  Function Tests:  Recent Labs Lab 08/28/14 0026  AST 41  ALT 25  ALKPHOS 57  BILITOT 1.4*  PROT 6.6  ALBUMIN 4.2   No results for input(s): LIPASE, AMYLASE in the last 168 hours. No results for input(s): AMMONIA in the last 168 hours. CBC:  Recent Labs Lab 08/28/14 0026  WBC 13.2*  NEUTROABS 10.6*  HGB 11.6*  HCT 31.7*  MCV 81.5  PLT 253   Cardiac Enzymes: No results for input(s): CKTOTAL, CKMB, CKMBINDEX, TROPONINI in the last 168 hours.  BNP (last 3 results) No results for input(s): PROBNP in the last 8760 hours. CBG:  Recent Labs Lab 08/28/14 0150  GLUCAP 223*    Radiological Exams on Admission: Ct Head Wo Contrast  08/28/2014   CLINICAL DATA:  Status post fall, with posterior head laceration. Initial encounter.  EXAM: CT HEAD WITHOUT CONTRAST  TECHNIQUE: Contiguous axial images were obtained from the base of the skull through the vertex without intravenous contrast.  COMPARISON:  CT of the head performed 01/03/2011  FINDINGS: There is no evidence of acute infarction, mass lesion, or intra- or extra-axial hemorrhage on CT.  Prominence of the sulci suggests mild to moderate cortical volume loss. Mild periventricular white matter change likely reflects small vessel ischemic microangiopathy. Mild cerebellar atrophy is noted. Chronic ischemic change is noted at the external capsule bilaterally.  The brainstem and fourth ventricle are within normal limits. The basal ganglia are unremarkable in appearance. The cerebral hemispheres demonstrate grossly normal gray-white differentiation. No mass effect or midline shift is seen.  There is no evidence of fracture; visualized osseous structures are unremarkable in appearance. The visualized portions of the orbits are within normal limits. The paranasal sinuses and mastoid air cells are well-aerated. The soft tissues are not well assessed due to overlying bandages.  IMPRESSION: 1. No evidence of traumatic intracranial injury or  fracture. 2. Mild to moderate cortical volume loss and scattered small vessel ischemic microangiopathy. Chronic ischemic change at the external capsule bilaterally. 3. Soft tissue laceration not well assessed due to overlying bandages.   Electronically Signed   By: Roanna Raider M.D.   On: 08/28/2014 00:33    EKG: Independently reviewed.  Assessment/Plan Principal Problem:   Hyponatremia Active Problems:   HTN (hypertension)   Confusion   AKI (acute kidney injury)   Hypochloremia   1. Hyponatremia with hypochloremia - unclear cause 1. Urine sodium pending 2. On NS for now, will need to stop this and increase salt content of IVF / fluid  restrict patient if urine sodium comes back elevated. 3. CXR pending 4. Repeat BMP in AM 5. Already got 1L NS bolus in ED, per EDP patient actually appears to have slight improvement in mental status since initial evaluation. 2. Confusion - secondary to #1 3. AKI - patients baseline creatinine was 0.72 in 2012, it is unknown what it was back on 08/19/14 as creatinine is also one of the labs missing from the CMP, her BUN however was 13 on that same CMP (now elevated to 26 today) 1. Hydrating patient 2. Repeat BMP in AM 4. HTN - holding home meds as patient is actually running BPs on the low side today in the ED (lowest is 96/35).  Code Status: Full Code  Family Communication: Family at bedside Disposition Plan: Admit to SDU   Time spent: 70 min  Paeton Latouche M. [facility] Pager [phone]  If 7AM-7PM, please contact the day team taking care of the patient [email or web address] Password [email or web address] 08/28/2014, 3:35 AM

## 2014-08-28 NOTE — Plan of Care (Signed)
Problem: Phase I Progression Outcomes Goal: OOB as tolerated unless otherwise ordered Outcome: Not Met (add Reason) Pt remained on bedrest Goal: Voiding-avoid urinary catheter unless indicated Outcome: Not Met (add Reason) Foley catheter placed for urinary retention, and more accurate I/O monitoring.

## 2014-08-29 LAB — BASIC METABOLIC PANEL
ANION GAP: 8 (ref 5–15)
Anion gap: 9 (ref 5–15)
Anion gap: 10 (ref 5–15)
Anion gap: 7 (ref 5–15)
BUN: 24 mg/dL — ABNORMAL HIGH (ref 6–20)
BUN: 22 mg/dL — AB (ref 6–20)
BUN: 21 mg/dL — AB (ref 6–20)
BUN: 20 mg/dL (ref 6–20)
CALCIUM: 8.4 mg/dL — AB (ref 8.9–10.3)
CO2: 21 mmol/L — AB (ref 22–32)
CO2: 21 mmol/L — AB (ref 22–32)
CO2: 22 mmol/L (ref 22–32)
CO2: 22 mmol/L (ref 22–32)
CREATININE: 1.39 mg/dL — AB (ref 0.44–1.00)
Calcium: 8.3 mg/dL — ABNORMAL LOW (ref 8.9–10.3)
Calcium: 8.3 mg/dL — ABNORMAL LOW (ref 8.9–10.3)
Calcium: 8.2 mg/dL — ABNORMAL LOW (ref 8.9–10.3)
Chloride: 90 mmol/L — ABNORMAL LOW (ref 101–111)
Chloride: 91 mmol/L — ABNORMAL LOW (ref 101–111)
Chloride: 91 mmol/L — ABNORMAL LOW (ref 101–111)
Chloride: 94 mmol/L — ABNORMAL LOW (ref 101–111)
Creatinine, Ser: 1.6 mg/dL — ABNORMAL HIGH (ref 0.44–1.00)
Creatinine, Ser: 1.37 mg/dL — ABNORMAL HIGH (ref 0.44–1.00)
Creatinine, Ser: 1.14 mg/dL — ABNORMAL HIGH (ref 0.44–1.00)
GFR calc Af Amer: 32 mL/min — ABNORMAL LOW (ref >60)
GFR calc Af Amer: 37 mL/min — ABNORMAL LOW (ref >60)
GFR calc Af Amer: 38 mL/min — ABNORMAL LOW (ref >60)
GFR calc non Af Amer: 27 mL/min — ABNORMAL LOW (ref >60)
GFR calc non Af Amer: 32 mL/min — ABNORMAL LOW (ref >60)
GFR calc non Af Amer: 33 mL/min — ABNORMAL LOW (ref >60)
GFR, EST AFRICAN AMERICAN: 48 mL/min — AB (ref >60)
GFR, EST NON AFRICAN AMERICAN: 41 mL/min — AB (ref >60)
GLUCOSE: 91 mg/dL (ref 65–99)
GLUCOSE: 124 mg/dL — AB (ref 65–99)
GLUCOSE: 120 mg/dL — AB (ref 65–99)
Glucose, Bld: 86 mg/dL (ref 65–99)
Potassium: 4.6 mmol/L (ref 3.5–5.1)
Potassium: 4.3 mmol/L (ref 3.5–5.1)
Potassium: 4 mmol/L (ref 3.5–5.1)
Potassium: 4.2 mmol/L (ref 3.5–5.1)
SODIUM: 120 mmol/L — AB (ref 135–145)
SODIUM: 122 mmol/L — AB (ref 135–145)
Sodium: 121 mmol/L — ABNORMAL LOW (ref 135–145)
Sodium: 123 mmol/L — ABNORMAL LOW (ref 135–145)

## 2014-08-29 LAB — URIC ACID, RANDOM URINE: URIC ACID, URINE: 39.9 mg/dL

## 2014-08-29 MED ORDER — SODIUM CHLORIDE 3 % IV SOLN
INTRAVENOUS | Status: DC
  Filled 2014-08-29: qty 500

## 2014-08-29 MED ORDER — HEPARIN SODIUM (PORCINE) 5000 UNIT/ML IJ SOLN
5000.0000 [IU] | Freq: Three times a day (TID) | INTRAMUSCULAR | Status: DC
  Administered 2014-08-29 – 2014-09-01 (×9): 5000 [IU] via SUBCUTANEOUS
  Filled 2014-08-29 (×12): qty 1

## 2014-08-29 MED ORDER — SODIUM CHLORIDE 3 % IV SOLN
INTRAVENOUS | Status: DC
  Administered 2014-08-29: 17 mL/h via INTRAVENOUS
  Filled 2014-08-29: qty 500

## 2014-08-29 NOTE — Progress Notes (Addendum)
  [facility] Progress Note   Subjective: Na 126  Filed Vitals:   08/29/14 0600 08/29/14 0605 08/29/14 0700 08/29/14 0800  BP: 132/48   120/41  Pulse: 84  89 91  Temp:  97.8 F (36.6 C)    TempSrc:  Oral    Resp: 21  16 15   Height:      Weight:      SpO2: 90%  95% 100%   Exam: Alert, elderly and somewhat frail No jvd Chest clear bilat RRR no MRG Abd soft, ntnd no ascites SKin turgor good No LE edema Neuro is alert, oriented to year and name, not place  UNa 29 UCr 113 UOsm 350   Assessment: 1. 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 S/P 3% saline, improved. Start NaCl tabs 6gm/ day , lasix 20/ d and continue fluid restriction. Can be dc'd on this regimen w PCP f/u.  Avoid ACEi/ARB's in this pt and aldactone.  Loop diuretic ok as this will improve Na levels. Will sign off.  2. AMS as above 3. HTN 4. Acute /CRF - better, Cr 1.1 5. Debilitly    Plan - as above    MD  pager [phone]    cell [phone]  08/29/2014, 1:00 PM     Recent Labs Lab 08/28/14 2200 08/29/14 0220 08/29/14 0615  NA 116* 120* 122*  K 4.4 4.6 4.3  CL 86* 90* 91*  CO2 21* 21* 21*  GLUCOSE 107* 91 86  BUN 25* 24* 22*  CREATININE 1.72* 1.60* 1.39*  CALCIUM 8.4* 8.3* 8.3*    Recent Labs Lab 08/28/14 0026  AST 41  ALT 25  ALKPHOS 57  BILITOT 1.4*  PROT 6.6  ALBUMIN 4.2    Recent Labs Lab 08/28/14 0026 08/28/14 0525  WBC 13.2* 13.4*  NEUTROABS 10.6*  --   HGB 11.6* 10.7*  HCT 31.7* 29.4*  MCV 81.5 80.3  PLT 253 225   . aspirin  325 mg Oral Daily  . heparin subcutaneous  5,000 Units Subcutaneous 3 times per day  . levothyroxine  100 mcg Oral Daily  . sodium chloride  10-40 mL Intracatheter Q12H   . sodium chloride (hypertonic)     hydrALAZINE, ondansetron (ZOFRAN) IV, sodium chloride

## 2014-08-29 NOTE — Evaluation (Signed)
Physical Therapy Evaluation Patient Details Name: Bethany Blake MRN: [id number] DOB: [birth year] Today's Date: 08/29/2014   History of Present Illness  Pt is a 81 year old female admitted after falling at home and found to be hyponatremic with hx of HTN, arthritis, hyponatremia, hypokalemia and shoulder and wrist surgery.  Clinical Impression  Pt admitted with above diagnosis. Pt currently with functional limitations due to the deficits listed below (see PT Problem List).  Pt will benefit from skilled PT to increase their independence and safety with mobility to allow discharge to the venue listed below.   Pt awake/alert however poor historian and no family present today.  Pt only agreeable to OOB to recliner due to being hungry, so assisted to recliner then RN into room to assist with feeding. Pt presents with weakness and posterior lean during transfers so currently recommend SNF.     Follow Up Recommendations SNF;Supervision/Assistance - 24 hour    Equipment Recommendations  None recommended by PT (TBA if home)    Recommendations for Other Services       Precautions / Restrictions Precautions Precautions: Fall      Mobility  Bed Mobility Overal bed mobility: Needs Assistance Bed Mobility: Supine to Sit     Supine to sit: Min assist     General bed mobility comments: assist for trunk upright  Transfers Overall transfer level: Needs assistance Equipment used: 2 person hand held assist Transfers: Sit to/from [employer] Transfers Sit to Stand: Mod assist;+2 safety/equipment Stand pivot transfers: Mod assist;+2 safety/equipment       General transfer comment: verbal cues for technique and weight shifting, assist for posterior lean  Ambulation/Gait                Stairs            Wheelchair Mobility    Modified Rankin (Stroke Patients Only)       Balance Overall balance assessment: History of Falls                                            Pertinent Vitals/Pain Pain Assessment: No/denies pain    Home Living Family/patient expects to be discharged to:: Private residence Living Arrangements: Spouse/significant other               Additional Comments: pt poor historian, per chart from home with spouse    Prior Function Level of Independence: Independent with assistive device(s)         Comments: fell at home, reports she is ambulatory however unable to state if she uses assistive device     Hand Dominance        Extremity/Trunk Assessment               Lower Extremity Assessment: Generalized weakness         Communication   Communication: No difficulties  Cognition Arousal/Alertness: Awake/alert Behavior During Therapy: WFL for tasks assessed/performed Overall Cognitive Status: Impaired/Different from baseline (poor historian however able to follow commands)                      General Comments      Exercises        Assessment/Plan    PT Assessment Patient needs continued PT services  PT Diagnosis Difficulty walking;Generalized weakness   PT Problem List Decreased strength;Decreased activity tolerance;Decreased mobility;Decreased cognition;Decreased balance;Decreased  knowledge of use of DME  PT Treatment Interventions DME instruction;Gait training;Functional mobility training;Patient/family education;Therapeutic activities;Therapeutic exercise;Balance training   PT Goals (Current goals can be found in the Care Plan section) Acute Rehab PT Goals PT Goal Formulation: With patient Time For Goal Achievement: 09/05/14 Potential to Achieve Goals: Good    Frequency Min 3X/week   Barriers to discharge        Co-evaluation               End of Session Equipment Utilized During Treatment: Gait belt Activity Tolerance: Patient tolerated treatment well Patient left: in chair;with call bell/phone within reach;with chair alarm set;with nursing/sitter in  room Nurse Communication: Mobility status         Time: 1223-1237 PT Time Calculation (min) (ACUTE ONLY): 14 min   Charges:   PT Evaluation $Initial PT Evaluation Tier I: 1 Procedure     PT G Codes:        Esequiel Kleinfelter,KATHrine E 08/29/2014, 4:02 PM Zenovia Jarred, PT, DPT 08/29/2014 Pager: [phone]

## 2014-08-29 NOTE — Progress Notes (Signed)
Patient Demographics  Bethany Blake, is a 81 y.o. female, DOB - 07/11/1921, [id number]  Admit date - 08/27/2014   Admitting Physician Hillary Bow, DO  Outpatient Primary MD for the patient is Michiel Sites, MD  LOS - 1   Chief Complaint  Patient presents with  . Fall  . Head Laceration        Subjective:   [facility] today has, No headache, No chest pain, No abdominal pain - No Nausea, No new weakness tingling or numbness, No Cough - SOB.    Assessment & Plan    1. Hyponatremia with confusion and fall. Lab work indicating mostly towards SIADH, renal following, needed 3% normal saline for the first 15 hours with appropriate correction. We will continue free water restriction. Monitor BMP closely. We'll defer management of sodium issues to renal. Head CT nonacute, no focal deficits. Has mild confusion only at this time likely hospital-induced delirium.   2. Essential hypertension. Blood pressure is stable on as needed IV hydralazine.   3. Hypothyroidism. On Synthroid continue.   4. Acute renal failure. Improved with minimal hydration with 3%. Continue to monitor the trend. Avoid nephrotoxins.     Code Status: Full  Family Communication: daughter [phone]  Disposition Plan: TBD   Consults  Renal   Procedures  CT Head   DVT Prophylaxis  Heparin    Lab Results  Component Value Date   PLT 225 08/28/2014    Medications  Scheduled Meds: . aspirin  325 mg Oral Daily  . levothyroxine  100 mcg Oral Daily  . sodium chloride  10-40 mL Intracatheter Q12H   Continuous Infusions:  PRN Meds:.hydrALAZINE, ondansetron (ZOFRAN) IV, sodium chloride  Antibiotics     Anti-infectives    None        Objective:   Filed Vitals:   08/29/14 0600 08/29/14 0605 08/29/14  0700 08/29/14 0800  BP: 132/48   120/41  Pulse: 84  89 91  Temp:  97.8 F (36.6 C)    TempSrc:  Oral    Resp: 21  16 15   Height:      Weight:      SpO2: 90%  95% 100%    Wt Readings from Last 3 Encounters:  08/29/14 63.6 kg (140 lb 3.4 oz)  09/12/11 66.225 kg (146 lb)  08/15/11 66.497 kg (146 lb 9.6 oz)     Intake/Output Summary (Last 24 hours) at 08/29/14 1059 Last data filed at 08/29/14 0500  Gross per 24 hour  Intake  264.4 ml  Output   1350 ml  Net -1085.6 ml     Physical Exam  Awake , mildly confused, Oriented X 2, No new F.N deficits, Normal affect [place].AT,PERRAL Supple Neck,No JVD, No cervical lymphadenopathy appriciated.  Symmetrical Chest wall movement, Good air movement bilaterally, CTAB RRR,No Gallops,Rubs or new Murmurs, No Parasternal Heave +ve B.Sounds, Abd Soft, No tenderness, No organomegaly appriciated, No rebound - guarding or rigidity. No Cyanosis, Clubbing or edema, No new Rash or bruise      Data Review   Micro Results Recent Results (from the past 240 hour(s))  MRSA PCR Screening     Status: None   Collection Time: 08/28/14  5:35 AM  Result Value Ref Range  Status   MRSA by PCR NEGATIVE NEGATIVE Final    Comment:        The GeneXpert MRSA Assay (FDA approved for NASAL specimens only), is one component of a comprehensive MRSA colonization surveillance program. It is not intended to diagnose MRSA infection nor to guide or monitor treatment for MRSA infections.     Radiology Reports Dg Chest 1 View  08/28/2014   CLINICAL DATA:  Hyponatremia.  EXAM: CHEST  1 VIEW  COMPARISON:  Frontal and lateral views 01/03/2011  FINDINGS: The patient is rotated. The mediastinal contours and mild cardiomegaly are unchanged. There is elevation of left hemidiaphragm with adjacent atelectasis or scarring. No consolidation to suggest pneumonia. No pulmonary edema, pleural effusion or pneumothorax. Chronic change about the right shoulder.  IMPRESSION: Stable  mild cardiomegaly. No acute pulmonary process or congestive heart failure.   Electronically Signed   By: Rubye Oaks M.D.   On: 08/28/2014 03:46   Ct Head Wo Contrast  08/28/2014   CLINICAL DATA:  Status post fall, with posterior head laceration. Initial encounter.  EXAM: CT HEAD WITHOUT CONTRAST  TECHNIQUE: Contiguous axial images were obtained from the base of the skull through the vertex without intravenous contrast.  COMPARISON:  CT of the head performed 01/03/2011  FINDINGS: There is no evidence of acute infarction, mass lesion, or intra- or extra-axial hemorrhage on CT.  Prominence of the sulci suggests mild to moderate cortical volume loss. Mild periventricular white matter change likely reflects small vessel ischemic microangiopathy. Mild cerebellar atrophy is noted. Chronic ischemic change is noted at the external capsule bilaterally.  The brainstem and fourth ventricle are within normal limits. The basal ganglia are unremarkable in appearance. The cerebral hemispheres demonstrate grossly normal gray-white differentiation. No mass effect or midline shift is seen.  There is no evidence of fracture; visualized osseous structures are unremarkable in appearance. The visualized portions of the orbits are within normal limits. The paranasal sinuses and mastoid air cells are well-aerated. The soft tissues are not well assessed due to overlying bandages.  IMPRESSION: 1. No evidence of traumatic intracranial injury or fracture. 2. Mild to moderate cortical volume loss and scattered small vessel ischemic microangiopathy. Chronic ischemic change at the external capsule bilaterally. 3. Soft tissue laceration not well assessed due to overlying bandages.   Electronically Signed   By: Roanna Raider M.D.   On: 08/28/2014 00:33     CBC  Recent Labs Lab 08/28/14 0026 08/28/14 0525  WBC 13.2* 13.4*  HGB 11.6* 10.7*  HCT 31.7* 29.4*  PLT 253 225  MCV 81.5 80.3  MCH 29.8 29.2  MCHC 36.6* 36.4*  RDW 12.4  12.3  LYMPHSABS 1.9  --   MONOABS 0.7  --   EOSABS 0.0  --   BASOSABS 0.0  --     Chemistries   Recent Labs Lab 08/28/14 0026  08/28/14 0900 08/28/14 1440 08/28/14 1800 08/28/14 2200 08/29/14 0220 08/29/14 0615  NA 111*  < > 113* 113* 114* 116* 120* 122*  K 4.7  < > 5.1  --  4.8 4.4 4.6 4.3  CL 77*  < > 81*  --  83* 86* 90* 91*  CO2 20*  < > 19*  --  21* 21* 21* 21*  GLUCOSE 192*  < > 107*  --  88 107* 91 86  BUN 26*  < > 26*  --  25* 25* 24* 22*  CREATININE 1.82*  < > 1.87*  --  1.80* 1.72* 1.60* 1.39*  CALCIUM 9.4  < > 8.9  --  8.3* 8.4* 8.3* 8.3*  AST 41  --   --   --   --   --   --   --   ALT 25  --   --   --   --   --   --   --   ALKPHOS 57  --   --   --   --   --   --   --   BILITOT 1.4*  --   --   --   --   --   --   --   < > = values in this interval not displayed. ------------------------------------------------------------------------------------------------------------------ estimated creatinine clearance is 23 mL/min (by C-G formula based on Cr of 1.39). ------------------------------------------------------------------------------------------------------------------ No results for input(s): HGBA1C in the last 72 hours. ------------------------------------------------------------------------------------------------------------------ No results for input(s): CHOL, HDL, LDLCALC, TRIG, CHOLHDL, LDLDIRECT in the last 72 hours. ------------------------------------------------------------------------------------------------------------------ No results for input(s): TSH, T4TOTAL, T3FREE, THYROIDAB in the last 72 hours.  Invalid input(s): FREET3 ------------------------------------------------------------------------------------------------------------------ No results for input(s): VITAMINB12, FOLATE, FERRITIN, TIBC, IRON, RETICCTPCT in the last 72 hours.  Coagulation profile No results for input(s): INR, PROTIME in the last 168 hours.  No results for input(s):  DDIMER in the last 72 hours.  Cardiac Enzymes No results for input(s): CKMB, TROPONINI, MYOGLOBIN in the last 168 hours.  Invalid input(s): CK ------------------------------------------------------------------------------------------------------------------ Invalid input(s): POCBNP   Time Spent in minutes  35   Dimitris Shanahan K M.D on 08/29/2014 at 10:59 AM  Between 7am to 7pm - Pager - [phone]  After 7pm go to [email or web address] - password [facility]  [facility]   Office  [phone]

## 2014-08-30 LAB — BASIC METABOLIC PANEL
Anion gap: 8 (ref 5–15)
Anion gap: 9 (ref 5–15)
BUN: 18 mg/dL (ref 6–20)
BUN: 16 mg/dL (ref 6–20)
CO2: 20 mmol/L — ABNORMAL LOW (ref 22–32)
CO2: 21 mmol/L — AB (ref 22–32)
Calcium: 8.3 mg/dL — ABNORMAL LOW (ref 8.9–10.3)
Calcium: 8.3 mg/dL — ABNORMAL LOW (ref 8.9–10.3)
Chloride: 95 mmol/L — ABNORMAL LOW (ref 101–111)
Chloride: 96 mmol/L — ABNORMAL LOW (ref 101–111)
Creatinine, Ser: 1.04 mg/dL — ABNORMAL HIGH (ref 0.44–1.00)
Creatinine, Ser: 1.12 mg/dL — ABNORMAL HIGH (ref 0.44–1.00)
GFR calc Af Amer: 53 mL/min — ABNORMAL LOW (ref >60)
GFR calc non Af Amer: 42 mL/min — ABNORMAL LOW (ref >60)
GFR, EST AFRICAN AMERICAN: 49 mL/min — AB (ref >60)
GFR, EST NON AFRICAN AMERICAN: 46 mL/min — AB (ref >60)
Glucose, Bld: 101 mg/dL — ABNORMAL HIGH (ref 65–99)
Glucose, Bld: 98 mg/dL (ref 65–99)
POTASSIUM: 4 mmol/L (ref 3.5–5.1)
Potassium: 4 mmol/L (ref 3.5–5.1)
Sodium: 123 mmol/L — ABNORMAL LOW (ref 135–145)
Sodium: 126 mmol/L — ABNORMAL LOW (ref 135–145)

## 2014-08-30 MED ORDER — FUROSEMIDE 20 MG PO TABS
20.0000 mg | ORAL_TABLET | Freq: Every day | ORAL | Status: DC
  Administered 2014-08-30 – 2014-08-31 (×2): 20 mg via ORAL
  Filled 2014-08-30 (×3): qty 1

## 2014-08-30 MED ORDER — CARVEDILOL 3.125 MG PO TABS
3.1250 mg | ORAL_TABLET | Freq: Two times a day (BID) | ORAL | Status: DC
  Administered 2014-08-30 – 2014-09-01 (×4): 3.125 mg via ORAL
  Filled 2014-08-30 (×6): qty 1

## 2014-08-30 MED ORDER — SODIUM CHLORIDE 1 G PO TABS
2.0000 g | ORAL_TABLET | Freq: Three times a day (TID) | ORAL | Status: DC
  Administered 2014-08-30 – 2014-09-01 (×6): 2 g via ORAL
  Filled 2014-08-30 (×9): qty 2

## 2014-08-30 NOTE — Progress Notes (Signed)
Patient Demographics  Bethany Blake, is a 81 y.o. female, DOB - 07-11-1921, [id number]  Admit date - 08/27/2014   Admitting Physician Hillary Bow, DO  Outpatient Primary MD for the patient is Michiel Sites, MD  LOS - 2   Chief Complaint  Patient presents with  . Fall  . Head Laceration        Subjective:   [facility] today has, No headache, No chest pain, No abdominal pain - No Nausea, No new weakness tingling or numbness, No Cough - SOB.    Assessment & Plan    1. Hyponatremia with confusion and fall. Lab work indicating mostly towards SIADH, renal following, needed 3% normal saline for the first 15 hours with appropriate correction. We will continue free water restriction. Monitor BMP closely. Head CT nonacute, no focal deficits. Has mild confusion only at this time likely hospital-induced delirium. Renal following.   2. Essential hypertension. Start low-dose Coreg, also on as needed IV hydralazine.   3. Hypothyroidism. On Synthroid continue.   4. Acute renal failure. Improved with minimal hydration with 3%. Continue to monitor the trend. Avoid nephrotoxins.     Code Status: Full  Family Communication: daughter 08-28-2014  Disposition Plan: SNF   Consults  Renal   Procedures  CT Head   DVT Prophylaxis  Heparin    Lab Results  Component Value Date   PLT 225 08/28/2014    Medications  Scheduled Meds: . aspirin  325 mg Oral Daily  . furosemide  20 mg Oral Daily  . heparin subcutaneous  5,000 Units Subcutaneous 3 times per day  . levothyroxine  100 mcg Oral Daily  . sodium chloride  10-40 mL Intracatheter Q12H  . sodium chloride  2 g Oral TID WC   Continuous Infusions:  PRN Meds:.hydrALAZINE, ondansetron (ZOFRAN) IV, sodium chloride  Antibiotics      Anti-infectives    None        Objective:   Filed Vitals:   08/30/14 0435 08/30/14 0600 08/30/14 0800 08/30/14 1033  BP: 146/49 147/51  162/52  Pulse: 113 110    Temp: 98.4 F (36.9 C)  98.6 F (37 C)   TempSrc: Oral  Oral   Resp: 20 17    Height:      Weight:      SpO2: 95% 94%      Wt Readings from Last 3 Encounters:  08/29/14 63.6 kg (140 lb 3.4 oz)  09/12/11 66.225 kg (146 lb)  08/15/11 66.497 kg (146 lb 9.6 oz)     Intake/Output Summary (Last 24 hours) at 08/30/14 1046 Last data filed at 08/30/14 0600  Gross per 24 hour  Intake 401.72 ml  Output    830 ml  Net -428.28 ml     Physical Exam  Awake , Alert, Oriented X 3, No new F.N deficits, Normal affect [place].AT,PERRAL Supple Neck,No JVD, No cervical lymphadenopathy appriciated.  Symmetrical Chest wall movement, Good air movement bilaterally, CTAB RRR,No Gallops,Rubs or new Murmurs, No Parasternal Heave +ve B.Sounds, Abd Soft, No tenderness, No organomegaly appriciated, No rebound - guarding or rigidity. No Cyanosis, Clubbing or edema, No new Rash or bruise      Data Review   Micro Results Recent Results (from the  past 240 hour(s))  MRSA PCR Screening     Status: None   Collection Time: 08/28/14  5:35 AM  Result Value Ref Range Status   MRSA by PCR NEGATIVE NEGATIVE Final    Comment:        The GeneXpert MRSA Assay (FDA approved for NASAL specimens only), is one component of a comprehensive MRSA colonization surveillance program. It is not intended to diagnose MRSA infection nor to guide or monitor treatment for MRSA infections.     Radiology Reports Dg Chest 1 View  08/28/2014   CLINICAL DATA:  Hyponatremia.  EXAM: CHEST  1 VIEW  COMPARISON:  Frontal and lateral views 01/03/2011  FINDINGS: The patient is rotated. The mediastinal contours and mild cardiomegaly are unchanged. There is elevation of left hemidiaphragm with adjacent atelectasis or scarring. No consolidation to suggest  pneumonia. No pulmonary edema, pleural effusion or pneumothorax. Chronic change about the right shoulder.  IMPRESSION: Stable mild cardiomegaly. No acute pulmonary process or congestive heart failure.   Electronically Signed   By: Rubye Oaks M.D.   On: 08/28/2014 03:46   Ct Head Wo Contrast  08/28/2014   CLINICAL DATA:  Status post fall, with posterior head laceration. Initial encounter.  EXAM: CT HEAD WITHOUT CONTRAST  TECHNIQUE: Contiguous axial images were obtained from the base of the skull through the vertex without intravenous contrast.  COMPARISON:  CT of the head performed 01/03/2011  FINDINGS: There is no evidence of acute infarction, mass lesion, or intra- or extra-axial hemorrhage on CT.  Prominence of the sulci suggests mild to moderate cortical volume loss. Mild periventricular white matter change likely reflects small vessel ischemic microangiopathy. Mild cerebellar atrophy is noted. Chronic ischemic change is noted at the external capsule bilaterally.  The brainstem and fourth ventricle are within normal limits. The basal ganglia are unremarkable in appearance. The cerebral hemispheres demonstrate grossly normal gray-white differentiation. No mass effect or midline shift is seen.  There is no evidence of fracture; visualized osseous structures are unremarkable in appearance. The visualized portions of the orbits are within normal limits. The paranasal sinuses and mastoid air cells are well-aerated. The soft tissues are not well assessed due to overlying bandages.  IMPRESSION: 1. No evidence of traumatic intracranial injury or fracture. 2. Mild to moderate cortical volume loss and scattered small vessel ischemic microangiopathy. Chronic ischemic change at the external capsule bilaterally. 3. Soft tissue laceration not well assessed due to overlying bandages.   Electronically Signed   By: Roanna Raider M.D.   On: 08/28/2014 00:33     CBC  Recent Labs Lab 08/28/14 0026 08/28/14 0525   WBC 13.2* 13.4*  HGB 11.6* 10.7*  HCT 31.7* 29.4*  PLT 253 225  MCV 81.5 80.3  MCH 29.8 29.2  MCHC 36.6* 36.4*  RDW 12.4 12.3  LYMPHSABS 1.9  --   MONOABS 0.7  --   EOSABS 0.0  --   BASOSABS 0.0  --     Chemistries   Recent Labs Lab 08/28/14 0026  08/29/14 0615 08/29/14 1400 08/29/14 2000 08/30/14 0215 08/30/14 0800  NA 111*  < > 122* 121* 123* 123* 126*  K 4.7  < > 4.3 4.0 4.2 4.0 4.0  CL 77*  < > 91* 91* 94* 95* 96*  CO2 20*  < > 21* 22 22 20* 21*  GLUCOSE 192*  < > 86 124* 120* 101* 98  BUN 26*  < > 22* 21* 20 18 16   CREATININE 1.82*  < >  1.39* 1.37* 1.14* 1.04* 1.12*  CALCIUM 9.4  < > 8.3* 8.4* 8.2* 8.3* 8.3*  AST 41  --   --   --   --   --   --   ALT 25  --   --   --   --   --   --   ALKPHOS 57  --   --   --   --   --   --   BILITOT 1.4*  --   --   --   --   --   --   < > = values in this interval not displayed. ------------------------------------------------------------------------------------------------------------------ estimated creatinine clearance is 28.5 mL/min (by C-G formula based on Cr of 1.12). ------------------------------------------------------------------------------------------------------------------ No results for input(s): HGBA1C in the last 72 hours. ------------------------------------------------------------------------------------------------------------------ No results for input(s): CHOL, HDL, LDLCALC, TRIG, CHOLHDL, LDLDIRECT in the last 72 hours. ------------------------------------------------------------------------------------------------------------------ No results for input(s): TSH, T4TOTAL, T3FREE, THYROIDAB in the last 72 hours.  Invalid input(s): FREET3 ------------------------------------------------------------------------------------------------------------------ No results for input(s): VITAMINB12, FOLATE, FERRITIN, TIBC, IRON, RETICCTPCT in the last 72 hours.  Coagulation profile No results for input(s): INR,  PROTIME in the last 168 hours.  No results for input(s): DDIMER in the last 72 hours.  Cardiac Enzymes No results for input(s): CKMB, TROPONINI, MYOGLOBIN in the last 168 hours.  Invalid input(s): CK ------------------------------------------------------------------------------------------------------------------ Invalid input(s): POCBNP   Time Spent in minutes  35   SINGH,PRASHANT K M.D on 08/30/2014 at 10:46 AM  Between 7am to 7pm - Pager - [phone]  After 7pm go to [email or web address] - password [facility]  [facility]   Office  [phone]

## 2014-08-31 LAB — BASIC METABOLIC PANEL
Anion gap: 9 (ref 5–15)
BUN: 15 mg/dL (ref 6–20)
CALCIUM: 8.5 mg/dL — AB (ref 8.9–10.3)
CO2: 23 mmol/L (ref 22–32)
Chloride: 97 mmol/L — ABNORMAL LOW (ref 101–111)
Creatinine, Ser: 1.18 mg/dL — ABNORMAL HIGH (ref 0.44–1.00)
GFR calc Af Amer: 46 mL/min — ABNORMAL LOW (ref >60)
GFR, EST NON AFRICAN AMERICAN: 39 mL/min — AB (ref >60)
GLUCOSE: 102 mg/dL — AB (ref 65–99)
Potassium: 4 mmol/L (ref 3.5–5.1)
Sodium: 129 mmol/L — ABNORMAL LOW (ref 135–145)

## 2014-08-31 LAB — TSH: TSH: 1.604 u[IU]/mL (ref 0.350–4.500)

## 2014-08-31 MED ORDER — FUROSEMIDE 20 MG PO TABS: 20.0000 mg | ORAL_TABLET | Freq: Every day | ORAL | Status: DC

## 2014-08-31 MED ORDER — ALTEPLASE 2 MG IJ SOLR
2.0000 mg | Freq: Once | INTRAMUSCULAR | Status: AC
  Administered 2014-08-31: 2 mg
  Filled 2014-08-31: qty 2

## 2014-08-31 MED ORDER — CARVEDILOL 3.125 MG PO TABS: 3.1250 mg | ORAL_TABLET | Freq: Two times a day (BID) | ORAL | Status: DC

## 2014-08-31 MED ORDER — HYDRALAZINE HCL 50 MG PO TABS: 50.0000 mg | ORAL_TABLET | Freq: Three times a day (TID) | ORAL | Status: DC

## 2014-08-31 MED ORDER — HYDRALAZINE HCL 50 MG PO TABS
50.0000 mg | ORAL_TABLET | Freq: Three times a day (TID) | ORAL | Status: DC
  Administered 2014-08-31 – 2014-09-01 (×4): 50 mg via ORAL
  Filled 2014-08-31 (×7): qty 1

## 2014-08-31 MED ORDER — SODIUM CHLORIDE 0.9 % IJ SOLN
10.0000 mL | INTRAMUSCULAR | Status: DC | PRN
  Administered 2014-08-31: 10 mL
  Administered 2014-09-01: 20 mL
  Filled 2014-08-31 (×2): qty 40

## 2014-08-31 NOTE — Discharge Summary (Signed)
Bethany Blake, is a 81 y.o. female  DOB [birth year]  MRN [id number].  Admission date:  08/27/2014  Admitting Physician  Hillary Bow, DO  Discharge Date:  09/01/2014   Primary MD  Michiel Sites, MD  Recommendations for primary care physician for things to follow:    Check CBC, BMP and TSH next visit. Monitor BMP closely.   Admission Diagnosis  Hyponatremia [E87.1] Closed head injury, initial encounter [S09.90XA] Scalp laceration, initial encounter [S01.01XA]   Discharge Diagnosis  Hyponatremia [E87.1] Closed head injury, initial encounter [S09.90XA] Scalp laceration, initial encounter [S01.01XA]     Principal Problem:   Hyponatremia Active Problems:   HTN (hypertension)   Confusion   AKI (acute kidney injury)   Hypochloremia      Past Medical History  Diagnosis Date  . Hypertension   . Hypothyroidism   . Hyponatremia   . Hypokalemia   . Arthritis   . Anxiety     Past Surgical History  Procedure Laterality Date  . Shoulder surgery    . Wrist surgery         History of present illness and  Hospital Course:     Kindly see H&P for history of present illness and admission details, please review complete Labs, Consult reports and Test reports for all details in brief  HPI  from the history and physical done on the day of admission  Bethany Blake is a 81 y.o. female who presents to the ED after a mechanical fall where she tripped over a piece of furniture at home this evening. She did hit her head and suffer a head lac which was repaired with 1 staple in the ED.  Patient has been having increasing confusion over the past week or so. She was seen by her doctor on 08/19/14, and noted to have "low sodium", how low her sodium was is unknown as the page with this lab finding on it is missing  from what her family were able to find (they have the entire CBC, and most of the rest of the CMP, but sodium is missing, presumably on another page of the printout). Per her family, her doctor advised that she "eat some pretzles", so presumably it was not severe at that time.  Hospital Course   1. Hyponatremia with confusion and fall. Lab work indicating mostly towards SIADH, renal following, needed 3% normal saline for the first 15 hours with appropriate correction. We will continue free water restriction along with low-dose Lasix. Sodium serially improving along with renal function. Her Head CT was nonacute, no focal deficits.seen by renal and will continue to do so in the outpatient setting, mental status back to baseline. Does have generalized weakness deconditioning requiring SNF placement once bed is available. Continue to monitor BMP closely check BMP once a week for the next month.   2. Essential hypertenPlaced on low-dose Coreg along with hydralazine, monitor blood pressure and adjust medications as needed.   3. Hypothyroidism. On Synthroid continue. TSH in the outpatient setting.  4. Acute renal failure. Improved with minimal hydration with 3%. Continue to monitor the trend. Avoid nephrotoxins.     Discharge Condition: Stable   Follow UP  Follow-up Information    Follow up with Michiel Sites, MD. Schedule an appointment as soon as possible for a visit on 09/09/2014.   Specialty:  Endocrinology   Why:  Please follow up with Dr. Juleen China on Wednesday, June 1st at 2:30pm. Please call the office today at [phone] to cancel the appt scheduled for tomorrow, May 24th.   Contact information:   [address] [address] [place] [address] [phone]       Follow up with Dagoberto Ligas., MD. Schedule an appointment as soon as possible for a visit in 1 week.   Specialty:  Nephrology   Why:  SIADH   Contact information:   [address] [place] [address] [phone]          Discharge Instructions  and  Discharge Medications         Discharge Instructions    Discharge instructions    Complete by:  As directed   Follow with Primary MD Michiel Sites, MD in 7 days   Get CBC, CMP, TSH, 2 view Chest X ray checked  By SNF MD    Activity: As tolerated with Full fall precautions use walker/cane & assistance as needed   Disposition SNF   Diet: Heart Healthy, Strict 1.5 lit/day fluid restriction.   On your next visit with your primary care physician please Get Medicines reviewed and adjusted.   Please request your Prim.MD to go over all Hospital Tests and Procedure/Radiological results at the follow up, please get all Hospital records sent to your Prim MD by signing hospital release before you go home.   If you experience worsening of your admission symptoms, develop shortness of breath, life threatening emergency, suicidal or homicidal thoughts you must seek medical attention immediately by calling 911 or calling your MD immediately  if symptoms less severe.  You Must read complete instructions/literature along with all the possible adverse reactions/side effects for all the Medicines you take and that have been prescribed to you. Take any new Medicines after you have completely understood and accpet all the possible adverse reactions/side effects.   Do not drive, operating heavy machinery, perform activities at heights, swimming or participation in water activities or provide baby sitting services if your were admitted for syncope or siezures until you have seen by Primary MD or a Neurologist and advised to do so again.  Do not drive when taking Pain medications.    Do not take more than prescribed Pain, Sleep and Anxiety Medications  Special Instructions: If you have smoked or chewed Tobacco  in the last 2 yrs please stop smoking, stop any regular Alcohol  and or any Recreational drug use.  Wear Seat belts while driving.   Please  note  You were cared for by a hospitalist during your hospital stay. If you have any questions about your discharge medications or the care you received while you were in the hospital after you are discharged, you can call the unit and asked to speak with the hospitalist on call if the hospitalist that took care of you is not available. Once you are discharged, your primary care physician will handle any further medical issues. Please note that NO REFILLS for any discharge medications will be authorized once you are discharged, as it is imperative that you return to your primary care physician (  or establish a relationship with a primary care physician if you do not have one) for your aftercare needs so that they can reassess your need for medications and monitor your lab values.     Increase activity slowly    Complete by:  As directed             Medication List    STOP taking these medications        cloNIDine 0.2 MG tablet  Commonly known as:  CATAPRES     losartan 50 MG tablet  Commonly known as:  COZAAR     spironolactone 25 MG tablet  Commonly known as:  ALDACTONE      TAKE these medications        aspirin 325 MG tablet  Take 325 mg by mouth daily.     carvedilol 3.125 MG tablet  Commonly known as:  COREG  Take 1 tablet (3.125 mg total) by mouth 2 (two) times daily with a meal.     docusate sodium 100 MG capsule  Commonly known as:  COLACE  Take 100 mg by mouth 3 (three) times daily as needed for mild constipation.     furosemide 20 MG tablet  Commonly known as:  LASIX  Take 1 tablet (20 mg total) by mouth daily.     hydrALAZINE 50 MG tablet  Commonly known as:  APRESOLINE  Take 1 tablet (50 mg total) by mouth every 8 (eight) hours.     levothyroxine 100 MCG tablet  Commonly known as:  SYNTHROID, LEVOTHROID  Take 100 mcg by mouth daily.          Diet and Activity recommendation: See Discharge Instructions above   Consults obtained - Renal   Major  procedures and Radiology Reports - PLEASE review detailed and final reports for all details, in brief -     Dg Chest 1 View  08/28/2014   CLINICAL DATA:  Hyponatremia.  EXAM: CHEST  1 VIEW  COMPARISON:  Frontal and lateral views 01/03/2011  FINDINGS: The patient is rotated. The mediastinal contours and mild cardiomegaly are unchanged. There is elevation of left hemidiaphragm with adjacent atelectasis or scarring. No consolidation to suggest pneumonia. No pulmonary edema, pleural effusion or pneumothorax. Chronic change about the right shoulder.  IMPRESSION: Stable mild cardiomegaly. No acute pulmonary process or congestive heart failure.   Electronically Signed   By: Rubye Oaks M.D.   On: 08/28/2014 03:46   Ct Head Wo Contrast  08/28/2014   CLINICAL DATA:  Status post fall, with posterior head laceration. Initial encounter.  EXAM: CT HEAD WITHOUT CONTRAST  TECHNIQUE: Contiguous axial images were obtained from the base of the skull through the vertex without intravenous contrast.  COMPARISON:  CT of the head performed 01/03/2011  FINDINGS: There is no evidence of acute infarction, mass lesion, or intra- or extra-axial hemorrhage on CT.  Prominence of the sulci suggests mild to moderate cortical volume loss. Mild periventricular white matter change likely reflects small vessel ischemic microangiopathy. Mild cerebellar atrophy is noted. Chronic ischemic change is noted at the external capsule bilaterally.  The brainstem and fourth ventricle are within normal limits. The basal ganglia are unremarkable in appearance. The cerebral hemispheres demonstrate grossly normal gray-white differentiation. No mass effect or midline shift is seen.  There is no evidence of fracture; visualized osseous structures are unremarkable in appearance. The visualized portions of the orbits are within normal limits. The paranasal sinuses and mastoid air cells are well-aerated. The soft tissues  are not well assessed due to  overlying bandages.  IMPRESSION: 1. No evidence of traumatic intracranial injury or fracture. 2. Mild to moderate cortical volume loss and scattered small vessel ischemic microangiopathy. Chronic ischemic change at the external capsule bilaterally. 3. Soft tissue laceration not well assessed due to overlying bandages.   Electronically Signed   By: Roanna Raider M.D.   On: 08/28/2014 00:33    Micro Results     Recent Results (from the past 240 hour(s))  MRSA PCR Screening     Status: None   Collection Time: 08/28/14  5:35 AM  Result Value Ref Range Status   MRSA by PCR NEGATIVE NEGATIVE Final    Comment:        The GeneXpert MRSA Assay (FDA approved for NASAL specimens only), is one component of a comprehensive MRSA colonization surveillance program. It is not intended to diagnose MRSA infection nor to guide or monitor treatment for MRSA infections.        Today   Subjective:   [facility] today has no headache,no chest abdominal pain,no new weakness tingling or numbness, feels much better wants to go home today.   Objective:   Blood pressure 156/66, pulse 97, temperature 98.3 F (36.8 C), temperature source Oral, resp. rate 20, height 5\' 1"  (1.549 m), weight 63.6 kg (140 lb 3.4 oz), SpO2 94 %.   Intake/Output Summary (Last 24 hours) at 09/01/14 0851 Last data filed at 09/01/14 0842  Gross per 24 hour  Intake      0 ml  Output    910 ml  Net   -910 ml    Exam Awake Alert, Oriented x 3, No new F.N deficits, Normal affect [place].AT,PERRAL Supple Neck,No JVD, No cervical lymphadenopathy appriciated.  Symmetrical Chest wall movement, Good air movement bilaterally, CTAB RRR,No Gallops,Rubs or new Murmurs, No Parasternal Heave +ve B.Sounds, Abd Soft, Non tender, No organomegaly appriciated, No rebound -guarding or rigidity. No Cyanosis, Clubbing or edema, No new Rash or bruise  Data Review   CBC w Diff:  Lab Results  Component Value Date   WBC 13.4* 08/28/2014   HGB  10.7* 08/28/2014   HCT 29.4* 08/28/2014   PLT 225 08/28/2014   LYMPHOPCT 14 08/28/2014   MONOPCT 5 08/28/2014   EOSPCT 0 08/28/2014   BASOPCT 0 08/28/2014    CMP:  Lab Results  Component Value Date   NA 129* 08/31/2014   K 4.0 08/31/2014   CL 97* 08/31/2014   CO2 23 08/31/2014   BUN 15 08/31/2014   CREATININE 1.18* 08/31/2014   PROT 6.6 08/28/2014   ALBUMIN 4.2 08/28/2014   BILITOT 1.4* 08/28/2014   ALKPHOS 57 08/28/2014   AST 41 08/28/2014   ALT 25 08/28/2014  .   Total Time in preparing paper work, data evaluation and todays exam - 35 minutes  08/30/2014 M.D on 09/01/2014 at 8:51 AM  [facility]   Office  [phone]

## 2014-08-31 NOTE — Progress Notes (Signed)
Physical Therapy Treatment Patient Details Name: Bethany Blake MRN: [id number] DOB: 1921/09/21 Today's Date: 08/31/2014    History of Present Illness Pt is a 81 year old female admitted after falling at home and found to be hyponatremic with hx of HTN, arthritis, hyponatremia, hypokalemia and shoulder and wrist surgery.    PT Comments    Pt progressing with mobility. She walked 55' with RW and min/guard assist, distance limited by fatigue.   Follow Up Recommendations  SNF;Supervision/Assistance - 24 hour     Equipment Recommendations  None recommended by PT (TBA if home)    Recommendations for Other Services       Precautions / Restrictions Precautions Precautions: Fall Restrictions Weight Bearing Restrictions: No    Mobility  Bed Mobility Overal bed mobility: Needs Assistance Bed Mobility: Supine to Sit     Supine to sit: Min assist     General bed mobility comments: assist for trunk upright  Transfers Overall transfer level: Needs assistance Equipment used: Rolling walker (2 wheeled) Transfers: Sit to/from Stand Sit to Stand: Min assist         General transfer comment: verbal/manual cues for hand placement and weight shifting  Ambulation/Gait Ambulation/Gait assistance: Min guard Ambulation Distance (Feet): 60 Feet Assistive device: Rolling walker (2 wheeled) Gait Pattern/deviations: Step-to pattern;Decreased step length - left;Decreased step length - right;Trunk flexed;Shuffle   Gait velocity interpretation: at or above normal speed for age/gender General Gait Details: cues to lift head and to increase step length (daughter reports shuffle is baseline due to some foot deformities); min/guard for safety   Stairs            Wheelchair Mobility    Modified Rankin (Stroke Patients Only)       Balance Overall balance assessment: History of Falls   Sitting balance-Leahy Scale: Good       Standing balance-Leahy Scale: Fair                       Cognition Arousal/Alertness: Awake/alert Behavior During Therapy: WFL for tasks assessed/performed Overall Cognitive Status: Within Functional Limits for tasks assessed (poor historian however able to follow commands)                      Exercises      General Comments        Pertinent Vitals/Pain Pain Assessment: No/denies pain    Home Living                      Prior Function            PT Goals (current goals can now be found in the care plan section) Acute Rehab PT Goals PT Goal Formulation: With patient/family Time For Goal Achievement: 09/05/14 Potential to Achieve Goals: Good Progress towards PT goals: Progressing toward goals    Frequency  Min 3X/week    PT Plan Current plan remains appropriate    Co-evaluation             End of Session Equipment Utilized During Treatment: Gait belt Activity Tolerance: Patient tolerated treatment well Patient left: in chair;with call bell/phone within reach;with chair alarm set;with family/visitor present     Time: 1010-1035 PT Time Calculation (min) (ACUTE ONLY): 25 min  Charges:  $Gait Training: 8-22 mins $Therapeutic Activity: 8-22 mins                    G Codes:  Ralene Bathe Kistler 08/31/2014, 11:29 AM [phone]

## 2014-08-31 NOTE — Clinical Social Work Placement (Signed)
   CLINICAL SOCIAL WORK PLACEMENT  NOTE  Date:  08/31/2014  Patient Details  Name: Bethany Blake MRN: [id number] Date of Birth: [birth year]  Clinical Social Work is seeking post-discharge placement for this patient at the Skilled  Nursing Facility level of care (*CSW will initial, date and re-position this form in  chart as items are completed):  Yes   Patient/family provided with [facility] Clinical Social Work Department's list of facilities offering this level of care within the geographic area requested by the patient (or if unable, by the patient's family).  Yes   Patient/family informed of their freedom to choose among providers that offer the needed level of care, that participate in Medicare, Medicaid or managed care program needed by the patient, have an available bed and are willing to accept the patient.  Yes   Patient/family informed of 's ownership interest in [facility] and [facility], as well as of the fact that they are under no obligation to receive care at these facilities.  PASRR submitted to EDS on 08/31/14     PASRR number received on       Existing PASRR number confirmed on 08/31/14     FL2 transmitted to all facilities in geographic area requested by pt/family on 08/31/14     FL2 transmitted to all facilities within larger geographic area on       Patient informed that his/her managed care company has contracts with or will negotiate with certain facilities, including the following:            Patient/family informed of bed offers received.  Patient chooses bed at       Physician recommends and patient chooses bed at      Patient to be transferred to   on  .  Patient to be transferred to facility by       Patient family notified on   of transfer.  Name of family member notified:        PHYSICIAN       Additional Comment:    _______________________________________________ Liliana Cline, LCSW 08/31/2014, 12:57 PM

## 2014-08-31 NOTE — Clinical Social Work Placement (Deleted)
   CLINICAL SOCIAL WORK PLACEMENT  NOTE  Date:  08/31/2014  Patient Details  Name: Bethany Blake MRN: [id number] Date of Birth: July 31, 1921  Clinical Social Work is seeking post-discharge placement for this patient at the   level of care (*CSW will initial, date and re-position this form in  chart as items are completed):      Patient/family provided with [facility] Clinical Social Work Department's list of facilities offering this level of care within the geographic area requested by the patient (or if unable, by the patient's family).      Patient/family informed of their freedom to choose among providers that offer the needed level of care, that participate in Medicare, Medicaid or managed care program needed by the patient, have an available bed and are willing to accept the patient.      Patient/family informed of [facility]'s ownership interest in [facility] and [facility], as well as of the fact that they are under no obligation to receive care at these facilities.  PASRR submitted to EDS on       PASRR number received on       Existing PASRR number confirmed on       FL2 transmitted to all facilities in geographic area requested by pt/family on       FL2 transmitted to all facilities within larger geographic area on       Patient informed that his/her managed care company has contracts with or will negotiate with certain facilities, including the following:            Patient/family informed of bed offers received.  Patient chooses bed at       Physician recommends and patient chooses bed at      Patient to be transferred to   on  .  Patient to be transferred to facility by       Patient family notified on   of transfer.  Name of family member notified:        PHYSICIAN       Additional Comment:    _______________________________________________ Liliana Cline, LCSW 08/31/2014, 12:56 PM

## 2014-08-31 NOTE — Clinical Social Work Note (Signed)
Clinical Social Work Assessment  Patient Details  Name: Bethany Blake MRN: [id number] Date of Birth: 1921/11/11  Date of referral:  08/30/14               Reason for consult:  Facility Placement                Permission sought to share information with:  Family Supports Permission granted to share information::  Yes, Verbal Permission Granted  Name::      (husband and daughter)  Agency::     Relationship::     Contact Information:     Housing/Transportation Living arrangements for the past 2 months:  [place] of Information:  Patient, Adult Children Patient Interpreter Needed:  None Criminal Activity/Legal Involvement Pertinent to Current Situation/Hospitalization:  No - Comment as needed Significant Relationships:  Adult Children, Spouse, Community Support Lives with:  Spouse Do you feel safe going back to the place where you live?  Yes Need for family participation in patient care:  Yes (Comment)  Care giving concerns:  PT is recommending SNF   Social Worker assessment / plan:  CSW met with patient and family at bedside- patient and her husband of 65 years have just recently moved into the [employer] where they have their own apartment, callbell and the facility offers a dining room for meals.  PT is recommending SNF- patient would like to return home with her husband but CSW has educated patient and family on the need for +2 assist with ambulation and the safety concerns if she goes directly home. Family is agreeable to pursuing SNF for some rehab if needed- CSW will complete FL2 and PASARR for SNF search and puruse SNF options.   Employment status:  Retired Nurse, adult PT Recommendations:  [place] / Referral to community resources:  [place]  Patient/Family's Response to care:  Daughter shared with CSW that she has not been able to speak to a doctor  since admission and was requesting to speak with MD today- CSW communicated to Dr Candiss Norse and he has spoken with them. Family understands the need for SNF and is interested in seeking options for this close to their homes.   Patient/Family's Understanding of and Emotional Response to Diagnosis, Current Treatment, and Prognosis:  Patient appears to be in somewhat of denial related to her needs at this time as she is unrealistic in thinking she can physically be maintained at home with/by her husband. Family supports the SNF plan and will assist with SNF selection. CSW has advised family the SNF transfer will require SNF auth from her insurance-  company-  Both patient and her family are hopeful and optimistic that she will progress and be back home with her husband soon. Emotional Assessment Appearance:  Appears younger than stated age Attitude/Demeanor/Rapport:  Other (Pleasant and appropriate) Affect (typically observed):  Calm, Happy, Hopeful Orientation:  Oriented to Self, Oriented to Place, Oriented to  Time, Oriented to Situation Alcohol / Substance use:  Not Applicable Psych involvement (Current and /or in the community):  No (Comment)  Discharge Needs  Concerns to be addressed:  Discharge Planning Concerns Readmission within the last 30 days:  No Current discharge risk:  Physical Impairment Barriers to Discharge:  No Barriers Identified   Ludwig Clarks, LCSW 08/31/2014, 12:49 PM

## 2014-08-31 NOTE — Clinical Social Work Placement (Signed)
CSW spoke with patient's daughter, Lawson Fiscal & provided SNF bed offers. Daughter chose bed at [facility] - awaiting [place] authorization. Anticipating discharge tomorrow.     Lincoln Maxin, LCSW [facility] Clinical Social Worker (covering for Reece Levy)  Ph#: [phone]    CLINICAL SOCIAL WORK PLACEMENT  NOTE  Date:  08/31/2014  Patient Details  Name: Bethany Blake MRN: [id number] Date of Birth: [birth year]  Clinical Social Work is seeking post-discharge placement for this patient at the Skilled  Nursing Facility level of care (*CSW will initial, date and re-position this form in  chart as items are completed):  Yes   Patient/family provided with [place] Clinical Social Work Department's list of facilities offering this level of care within the geographic area requested by the patient (or if unable, by the patient's family).  Yes   Patient/family informed of their freedom to choose among providers that offer the needed level of care, that participate in Medicare, Medicaid or managed care program needed by the patient, have an available bed and are willing to accept the patient.  Yes   Patient/family informed of [place]'s ownership interest in [facility] and [facility], as well as of the fact that they are under no obligation to receive care at these facilities.  PASRR submitted to EDS on 08/31/14     PASRR number received on       Existing PASRR number confirmed on 08/31/14     FL2 transmitted to all facilities in geographic area requested by pt/family on 08/31/14     FL2 transmitted to all facilities within larger geographic area on       Patient informed that his/her managed care company has contracts with or will negotiate with certain facilities, including the following:        Yes   Patient/family informed of bed offers received.  Patient chooses bed at [facility]     Physician recommends and patient chooses bed at       Patient to be transferred to [facility] on  .  Patient to be transferred to facility by       Patient family notified on   of transfer.  Name of family member notified:        PHYSICIAN       Additional Comment:    _______________________________________________ Arlyss Repress, LCSW 08/31/2014, 2:40 PM

## 2014-08-31 NOTE — Discharge Instructions (Signed)
Follow with Primary MD Michiel Sites, MD in 7 days   Get CBC, CMP, TSH,  2 view Chest X ray checked  By SNF MD    Activity: As tolerated with Full fall precautions use walker/cane & assistance as needed   Disposition SNF   Diet: Heart Healthy, Strict 1.5 lit/day fluid restriction.   On your next visit with your primary care physician please Get Medicines reviewed and adjusted.   Please request your Prim.MD to go over all Hospital Tests and Procedure/Radiological results at the follow up, please get all Hospital records sent to your Prim MD by signing hospital release before you go home.   If you experience worsening of your admission symptoms, develop shortness of breath, life threatening emergency, suicidal or homicidal thoughts you must seek medical attention immediately by calling 911 or calling your MD immediately  if symptoms less severe.  You Must read complete instructions/literature along with all the possible adverse reactions/side effects for all the Medicines you take and that have been prescribed to you. Take any new Medicines after you have completely understood and accpet all the possible adverse reactions/side effects.   Do not drive, operating heavy machinery, perform activities at heights, swimming or participation in water activities or provide baby sitting services if your were admitted for syncope or siezures until you have seen by Primary MD or a Neurologist and advised to do so again.  Do not drive when taking Pain medications.    Do not take more than prescribed Pain, Sleep and Anxiety Medications  Special Instructions: If you have smoked or chewed Tobacco  in the last 2 yrs please stop smoking, stop any regular Alcohol  and or any Recreational drug use.  Wear Seat belts while driving.   Please note  You were cared for by a hospitalist during your hospital stay. If you have any questions about your discharge medications or the care you received while  you were in the hospital after you are discharged, you can call the unit and asked to speak with the hospitalist on call if the hospitalist that took care of you is not available. Once you are discharged, your primary care physician will handle any further medical issues. Please note that NO REFILLS for any discharge medications will be authorized once you are discharged, as it is imperative that you return to your primary care physician (or establish a relationship with a primary care physician if you do not have one) for your aftercare needs so that they can reassess your need for medications and monitor your lab values.

## 2014-08-31 NOTE — Care Management Note (Signed)
Case Management Note  Patient Details  Name: Bethany Blake MRN: [id number] Date of Birth: 1922-03-11  Subjective/Objective:  81 yo female admitted with hyponatremia from ALF                  Action/Plan: CSW working with patient and family for ALF vs SNF placement  Expected Discharge Date:   (UNKNOWN)               Expected Discharge Plan:  Skilled Nursing Facility  In-House Referral:  NA, Financial Counselor  Discharge planning Services  CM Consult, Indigent Health Clinic  Post Acute Care Choice:  NA Choice offered to:  NA  DME Arranged:    DME Agency:     HH Arranged:    HH Agency:     Status of Service:  Completed, signed off  Medicare Important Message Given:  Yes Date Medicare IM Given:  08/31/14 Medicare IM give by:  Aileen Pilot Date Additional Medicare IM Given:    Additional Medicare Important Message give by:     If discussed at Long Length of Stay Meetings, dates discussed:    Additional Comments:  Gerrit Halls, RN 08/31/2014, 11:51 AM

## 2014-09-01 NOTE — Progress Notes (Signed)
Patient for d/c today to SNF bed at [facility]. Family and patient agreeable to this plan- plan transfer via EMS. Reece Levy, MSW, Theresia Majors [phone]

## 2014-09-01 NOTE — Clinical Social Work Placement (Signed)
   CLINICAL SOCIAL WORK PLACEMENT  NOTE  Date:  09/01/2014  Patient Details  Name: Bethany Blake MRN: [id number] Date of Birth: Jan 01, 1922  Clinical Social Work is seeking post-discharge placement for this patient at the Skilled  Nursing Facility level of care (*CSW will initial, date and re-position this form in  chart as items are completed):  Yes   Patient/family provided with [place] Clinical Social Work Department's list of facilities offering this level of care within the geographic area requested by the patient (or if unable, by the patient's family).  Yes   Patient/family informed of their freedom to choose among providers that offer the needed level of care, that participate in Medicare, Medicaid or managed care program needed by the patient, have an available bed and are willing to accept the patient.  Yes   Patient/family informed of [place]'s ownership interest in [facility] and [facility], as well as of the fact that they are under no obligation to receive care at these facilities.  PASRR submitted to EDS on 08/31/14     PASRR number received on       Existing PASRR number confirmed on 08/31/14     FL2 transmitted to all facilities in geographic area requested by pt/family on 08/31/14     FL2 transmitted to all facilities within larger geographic area on       Patient informed that his/her managed care company has contracts with or will negotiate with certain facilities, including the following:        Yes   Patient/family informed of bed offers received.  Patient chooses bed at [facility]     Physician recommends and patient chooses bed at      Patient to be transferred to [facility] on 09/01/14.  Patient to be transferred to facility by  Sharin Mons)     Patient family notified on 09/01/14 of transfer.  Name of family member notified:   (Patient. husband and daughter )     PHYSICIAN       Additional Comment:     _______________________________________________ Liliana Cline, LCSW 09/01/2014, 9:56 AM

## 2014-09-01 NOTE — Progress Notes (Signed)
Per MD order, PICC line removed. Cath intact at 45cm. Vaseline pressure gauze to site, pressure held x . No bleeding to site. Pt instructed to keep dressing CDI x 24 hours. Avoid heavy lifting, pushing or pulling x 24 hours,  If bleeding occurs hold pressure, if bleeding does not stop contact MD or go to the ED. Pt does not have any questions. Consuello Masse

## 2014-09-01 NOTE — Progress Notes (Signed)
VSS. PICC removed by IV team.  Pt daughter to transport to [facility], paperwork given to daughter to give to facility. No further questions. Justin Mend, RN

## 2014-09-02 ENCOUNTER — Non-Acute Institutional Stay: Payer: Medicare Other | Admitting: Adult Health

## 2014-09-02 ENCOUNTER — Encounter: Payer: Self-pay | Admitting: Adult Health

## 2014-09-02 DIAGNOSIS — D72829 Elevated white blood cell count, unspecified: Secondary | ICD-10-CM | POA: Diagnosis not present

## 2014-09-02 DIAGNOSIS — I10 Essential (primary) hypertension: Secondary | ICD-10-CM

## 2014-09-02 DIAGNOSIS — E871 Hypo-osmolality and hyponatremia: Secondary | ICD-10-CM | POA: Diagnosis not present

## 2014-09-02 DIAGNOSIS — S0101XS Laceration without foreign body of scalp, sequela: Secondary | ICD-10-CM

## 2014-09-02 DIAGNOSIS — K5900 Constipation, unspecified: Secondary | ICD-10-CM | POA: Diagnosis not present

## 2014-09-02 DIAGNOSIS — N179 Acute kidney failure, unspecified: Secondary | ICD-10-CM

## 2014-09-02 DIAGNOSIS — R5381 Other malaise: Secondary | ICD-10-CM | POA: Diagnosis not present

## 2014-09-02 DIAGNOSIS — E039 Hypothyroidism, unspecified: Secondary | ICD-10-CM | POA: Diagnosis not present

## 2014-09-02 NOTE — Progress Notes (Signed)
Patient ID: Bethany Blake, female   DOB: [birth year], 81 y.o.   MRN: [id number]   09/02/2014  Facility:  Nursing Home Location:  [facility] Room Number: 606-P LEVEL OF CARE:  SNF (31)   Chief Complaint  Patient presents with  . Hospitalization Follow-up    Physical deconditioning, hyponatremia, hypertension, constipation, hypothyroidism, leukocytosis, scalp laceration and acute kidney injury    HISTORY OF PRESENT ILLNESS:  This is a 81 year old female was been admitted to [facility] on 09/01/14 from [facility]. She has PMH of hypertension, hypothyroidism, hyponatremia, arthritis and anxiety. She tripped over a piece of furniture and hit her head sustaining a scalp laceration S/P repair with 1 staple in ED. She has been having increased confusion and seen by her PCP on 08/19/14 and was told to eat pretzels due to hyponatremia. 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  She has been admitted for a short-term rehabilitation.  PAST MEDICAL HISTORY:  Past Medical History  Diagnosis Date  . Hypertension   . Hypothyroidism   . Hyponatremia   . Hypokalemia   . Arthritis   . Anxiety     CURRENT MEDICATIONS: Reviewed per MAR/see medication list  Allergies  Allergen Reactions  . Ace Inhibitors Other (See Comments)    Severe hyponatremia  . Aldactone [Spironolactone] Other (See Comments)    Severe hyponatremia  . Angiotensin Receptor Blockers Other (See Comments)    Severe hyponatremia  . Sulfa Antibiotics Rash     REVIEW OF SYSTEMS:  GENERAL: no change in appetite, no fatigue, no weight changes, no fever, chills or weakness RESPIRATORY: no cough, SOB, DOE, wheezing, hemoptysis CARDIAC: no chest pain, or palpitations GI: no abdominal pain, diarrhea, constipation, heart burn, nausea or vomiting  PHYSICAL EXAMINATION  GENERAL: no acute distress, normal body habitus SKIN:  Has dried blood on  scalp with 1 staple EYES: conjunctivae normal, sclerae normal, normal eye lids NECK: supple, trachea midline, no neck masses, no thyroid tenderness, no thyromegaly LYMPHATICS: no LAN in the neck, no supraclavicular LAN RESPIRATORY: breathing is even & unlabored, BS CTAB CARDIAC: RRR, no murmur,no extra heart sounds, BLE edema 2+ GI: abdomen soft, normal BS, no masses, no tenderness, no hepatomegaly, no splenomegaly EXTREMITIES: Able to move 4 extremities PSYCHIATRIC: the patient is alert & oriented to person, affect & behavior appropriate  LABS/RADIOLOGY: Labs reviewed: Basic Metabolic Panel:  Recent Labs  90/21/11 0215 08/30/14 0800 08/31/14 0515  NA 123* 126* 129*  K 4.0 4.0 4.0  CL 95* 96* 97*  CO2 20* 21* 23  GLUCOSE 101* 98 102*  BUN 18 16 15   CREATININE 1.04* 1.12* 1.18*  CALCIUM 8.3* 8.3* 8.5*   Liver Function Tests:  Recent Labs  08/28/14 0026  AST 41  ALT 25  ALKPHOS 57  BILITOT 1.4*  PROT 6.6  ALBUMIN 4.2   CBC:  Recent Labs  08/28/14 0026 08/28/14 0525  WBC 13.2* 13.4*  NEUTROABS 10.6*  --   HGB 11.6* 10.7*  HCT 31.7* 29.4*  MCV 81.5 80.3  PLT 253 225   CBG:  Recent Labs  08/28/14 0150  GLUCAP 223*    Dg Chest 1 View  08/28/2014   CLINICAL DATA:  Hyponatremia.  EXAM: CHEST  1 VIEW  COMPARISON:  Frontal and lateral views 01/03/2011  FINDINGS: The patient is rotated. The mediastinal contours and mild cardiomegaly are unchanged. There is elevation of left  hemidiaphragm with adjacent atelectasis or scarring. No consolidation to suggest pneumonia. No pulmonary edema, pleural effusion or pneumothorax. Chronic change about the right shoulder.  IMPRESSION: Stable mild cardiomegaly. No acute pulmonary process or congestive heart failure.   Electronically Signed   By: Rubye Oaks M.D.   On: 08/28/2014 03:46   Ct Head Wo Contrast  08/28/2014   CLINICAL DATA:  Status post fall, with posterior head laceration. Initial encounter.  EXAM: CT HEAD  WITHOUT CONTRAST  TECHNIQUE: Contiguous axial images were obtained from the base of the skull through the vertex without intravenous contrast.  COMPARISON:  CT of the head performed 01/03/2011  FINDINGS: There is no evidence of acute infarction, mass lesion, or intra- or extra-axial hemorrhage on CT.  Prominence of the sulci suggests mild to moderate cortical volume loss. Mild periventricular white matter change likely reflects small vessel ischemic microangiopathy. Mild cerebellar atrophy is noted. Chronic ischemic change is noted at the external capsule bilaterally.  The brainstem and fourth ventricle are within normal limits. The basal ganglia are unremarkable in appearance. The cerebral hemispheres demonstrate grossly normal gray-white differentiation. No mass effect or midline shift is seen.  There is no evidence of fracture; visualized osseous structures are unremarkable in appearance. The visualized portions of the orbits are within normal limits. The paranasal sinuses and mastoid air cells are well-aerated. The soft tissues are not well assessed due to overlying bandages.  IMPRESSION: 1. No evidence of traumatic intracranial injury or fracture. 2. Mild to moderate cortical volume loss and scattered small vessel ischemic microangiopathy. Chronic ischemic change at the external capsule bilaterally. 3. Soft tissue laceration not well assessed due to overlying bandages.   Electronically Signed   By: Roanna Raider M.D.   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 Allena Katz, nephrologist, in 1 week NA 129; was given 3% NS in hospital; started on Lasix 20 mg 1 tab PO Q D Hypertension - well controlled; continue Lasix 20 mg 1 tab PO Q D,  Coreg 3.125 mg 1 tab by mouth twice a day and hydralazine 50 mg by mouth every 8 hours Constipation - decrease Colace to 100 mg by mouth twice a day Acute Kidney injury -   Creatinine 1.18; S/P 3% NS infusion; avoids NSAIDS Hypothyroidism - tsh 1.604; continue Synthroid 100 mcg 1 tab PO Q D Leukocytosis - wbc 13.4; will monitor Scalp laceration - wound treatment daily and monitor for infection   Goals of care:  Short-term rehabilitation   Labs/test ordered:  CBC, CMP    Spent 50 minutes in patient care.  [facility], NP [employer] [phone]

## 2014-09-03 LAB — HEPATIC FUNCTION PANEL
ALT: 11 U/L (ref 7–35)
AST: 15 U/L (ref 13–35)
Alkaline Phosphatase: 39 U/L (ref 25–125)
Bilirubin, Total: 0.5 mg/dL

## 2014-09-03 LAB — BASIC METABOLIC PANEL
BUN: 23 mg/dL — AB (ref 4–21)
CREATININE: 1.5 mg/dL — AB (ref 0.5–1.1)
Glucose: 92 mg/dL
Potassium: 3.9 mmol/L (ref 3.4–5.3)
Sodium: 132 mmol/L — AB (ref 137–147)

## 2014-09-03 LAB — CBC AND DIFFERENTIAL
HEMATOCRIT: 22 % — AB (ref 36–46)
HEMOGLOBIN: 7.6 g/dL — AB (ref 12.0–16.0)
Platelets: 239 10*3/uL (ref 150–399)
WBC: 6.4 10^3/mL

## 2014-09-11 ENCOUNTER — Non-Acute Institutional Stay (SKILLED_NURSING_FACILITY): Payer: Medicare Other | Admitting: Internal Medicine

## 2014-09-11 DIAGNOSIS — R5381 Other malaise: Secondary | ICD-10-CM | POA: Diagnosis not present

## 2014-09-11 DIAGNOSIS — E222 Syndrome of inappropriate secretion of antidiuretic hormone: Secondary | ICD-10-CM | POA: Diagnosis not present

## 2014-09-11 DIAGNOSIS — K5900 Constipation, unspecified: Secondary | ICD-10-CM | POA: Diagnosis not present

## 2014-09-11 DIAGNOSIS — E039 Hypothyroidism, unspecified: Secondary | ICD-10-CM

## 2014-09-11 DIAGNOSIS — B9620 Unspecified Escherichia coli [E. coli] as the cause of diseases classified elsewhere: Secondary | ICD-10-CM

## 2014-09-11 DIAGNOSIS — K5909 Other constipation: Secondary | ICD-10-CM

## 2014-09-11 DIAGNOSIS — S0191XS Laceration without foreign body of unspecified part of head, sequela: Secondary | ICD-10-CM

## 2014-09-11 DIAGNOSIS — D72829 Elevated white blood cell count, unspecified: Secondary | ICD-10-CM

## 2014-09-11 DIAGNOSIS — D62 Acute posthemorrhagic anemia: Secondary | ICD-10-CM | POA: Diagnosis not present

## 2014-09-11 DIAGNOSIS — N390 Urinary tract infection, site not specified: Secondary | ICD-10-CM

## 2014-09-11 DIAGNOSIS — I10 Essential (primary) hypertension: Secondary | ICD-10-CM | POA: Diagnosis not present

## 2014-09-11 NOTE — Progress Notes (Signed)
Patient ID: Bethany Blake, female   DOB: [birth year], 81 y.o.   MRN: [id number]     [facility]  PCP: Michiel Sites, MD  Code Status: Full Code   Allergies  Allergen Reactions  . Ace Inhibitors Other (See Comments)    Severe hyponatremia  . Aldactone [Spironolactone] Other (See Comments)    Severe hyponatremia  . Angiotensin Receptor Blockers Other (See Comments)    Severe hyponatremia  . Sulfa Antibiotics Rash    Chief Complaint  Patient presents with  . New Admit To SNF    New Admission      HPI:  81 year old patient is here for short term rehabilitation post hospital admission from a fall with confusion, hyponatremia and head laceration. She required 1 staple. She was diagnosed to have SIADH and received 3% NS.She has PMH of hypertension, hypothyroidism, hyponatremia, arthritis and anxiety. She is seen in her room today. She is alert and oriented and denies any concerns. No new concern from staff.  Review of Systems:  Constitutional: Negative for fever, chills, diaphoresis.  HENT: Negative for headache, congestion, nasal discharge Eyes: Negative for eye pain, blurred vision, double vision and discharge.  Respiratory: Negative for cough, shortness of breath and wheezing.   Cardiovascular: Negative for chest pain, palpitations, leg swelling.  Gastrointestinal: Negative for heartburn, nausea, vomiting, abdominal pain. Has chronic constipation, last bowel movement 3 days back. Genitourinary: Negative for dysuria.  Musculoskeletal: Negative for back pain, falls Skin: Negative for itching, rash.  Neurological: Negative for dizziness, tingling, focal weakness Psychiatric/Behavioral: Negative for depression   Past Medical History  Diagnosis Date  . Hypertension   . Hypothyroidism   . Hyponatremia   . Hypokalemia   . Arthritis   . Anxiety    Past Surgical History  Procedure Laterality Date  . Shoulder surgery    . Wrist surgery     Social  History:   reports that she has quit smoking. She has never used smokeless tobacco. She reports that she does not drink alcohol or use illicit drugs.  Family History  Problem Relation Age of Onset  . Colon cancer Neg Hx   . Colon polyps Neg Hx   . Rectal cancer Neg Hx   . Stomach cancer Neg Hx     Medications: Patient's Medications  New Prescriptions   No medications on file  Previous Medications   ACETAMINOPHEN (TYLENOL) 325 MG TABLET    Take 650 mg by mouth every 4 (four) hours as needed for moderate pain.   ASPIRIN 325 MG TABLET    Take 325 mg by mouth daily.   CARVEDILOL (COREG) 3.125 MG TABLET    Take 1 tablet (3.125 mg total) by mouth 2 (two) times daily with a meal.   CIPROFLOXACIN (CIPRO) 500 MG TABLET    Take 500 mg by mouth 2 (two) times daily.   DOCUSATE SODIUM (COLACE) 100 MG CAPSULE    Take 100 mg by mouth 2 (two) times daily.    HYDRALAZINE (APRESOLINE) 50 MG TABLET    Take 1 tablet (50 mg total) by mouth every 8 (eight) hours.   LEVOTHYROXINE (SYNTHROID, LEVOTHROID) 100 MCG TABLET    Take 100 mcg by mouth daily.   SACCHAROMYCES BOULARDII (FLORASTOR) 250 MG CAPSULE    Take 250 mg by mouth 2 (two) times daily.   SALINE NASAL SPRAY NA    Place 2 sprays into the nose 2 (two) times daily.  Modified Medications   Modified Medication  Previous Medication   FUROSEMIDE (LASIX) 20 MG TABLET furosemide (LASIX) 20 MG tablet      Take 1 tablet (20 mg total) by mouth every other day.    Take 1 tablet (20 mg total) by mouth daily.  Discontinued Medications   No medications on file     Physical Exam:  Filed Vitals:   09/11/14 1327  BP: 128/63  Pulse: 92  Temp: 98.6 F (37 C)  TempSrc: Oral  Resp: 18  Height: 5\' 1"  (1.549 m)  Weight: 134 lb 6.4 oz (60.963 kg)  SpO2: 94%    General- elderly female, in no acute distress Head- normocephalic, atraumatic, laceration healing well, staple removed Throat- moist mucus membrane, upper and lower dentures Eyes- wears glasses, no  pallor, no icterus, no discharge, normal conjunctiva, normal sclera Neck- no cervical lymphadenopathy Cardiovascular- normal s1,s2, no murmurs, palpable dorsalis pedis, trace leg edema Respiratory- bilateral clear to auscultation, no wheeze, no rhonchi, no crackles, no use of accessory muscles Abdomen- bowel sounds present, soft, non tender Musculoskeletal- able to move all 4 extremities, generalized weakness, arthritis changes to the digits  Neurological- no focal deficit, aao x 3 Skin- warm and dry Psychiatry- normal mood and affect    Labs reviewed: Basic Metabolic Panel:  Recent Labs  0215 08/30/14 0800 08/31/14 0515 09/03/14 1041  NA 123* 126* 129* 132*  K 4.0 4.0 4.0 3.9  CL 95* 96* 97*  --   CO2 20* 21* 23  --   GLUCOSE 101* 98 102*  --   BUN 18 16 15  23*  CREATININE 1.04* 1.12* 1.18* 1.5*  CALCIUM 8.3* 8.3* 8.5*  --    Liver Function Tests:  Recent Labs  08/28/14 0026 09/03/14 1041  AST 41 15  ALT 25 11  ALKPHOS 57 39  BILITOT 1.4*  --   PROT 6.6  --   ALBUMIN 4.2  --    No results for input(s): LIPASE, AMYLASE in the last 8760 hours. No results for input(s): AMMONIA in the last 8760 hours. CBC:  Recent Labs  08/28/14 0026 08/28/14 0525 09/03/14 1041  WBC 13.2* 13.4* 6.4  NEUTROABS 10.6*  --   --   HGB 11.6* 10.7* 7.6*  HCT 31.7* 29.4* 22*  MCV 81.5 80.3  --   PLT 253 225 239   Cardiac Enzymes: No results for input(s): CKTOTAL, CKMB, CKMBINDEX, TROPONINI in the last 8760 hours. BNP: Invalid input(s): POCBNP CBG:  Recent Labs  08/28/14 0150  GLUCAP 223*    Radiological Exams:  Dg Chest 1 View  08/28/2014   CLINICAL DATA:  Hyponatremia.  EXAM: CHEST  1 VIEW  COMPARISON:  Frontal and lateral views 01/03/2011  FINDINGS: The patient is rotated. The mediastinal contours and mild cardiomegaly are unchanged. There is elevation of left hemidiaphragm with adjacent atelectasis or scarring. No consolidation to suggest pneumonia. No  pulmonary edema, pleural effusion or pneumothorax. Chronic change about the right shoulder.  IMPRESSION: Stable mild cardiomegaly. No acute pulmonary process or congestive heart failure.   Electronically Signed   By: 08/30/2014 M.D.   On: 08/28/2014 03:46   Ct Head Wo Contrast  08/28/2014   CLINICAL DATA:  Status post fall, with posterior head laceration. Initial encounter.  EXAM: CT HEAD WITHOUT CONTRAST  TECHNIQUE: Contiguous axial images were obtained from the base of the skull through the vertex without intravenous contrast.  COMPARISON:  CT of the head performed 01/03/2011  FINDINGS: There is no evidence of acute infarction, mass lesion,  or intra- or extra-axial hemorrhage on CT.  Prominence of the sulci suggests mild to moderate cortical volume loss. Mild periventricular white matter change likely reflects small vessel ischemic microangiopathy. Mild cerebellar atrophy is noted. Chronic ischemic change is noted at the external capsule bilaterally.  The brainstem and fourth ventricle are within normal limits. The basal ganglia are unremarkable in appearance. The cerebral hemispheres demonstrate grossly normal gray-white differentiation. No mass effect or midline shift is seen.  There is no evidence of fracture; visualized osseous structures are unremarkable in appearance. The visualized portions of the orbits are within normal limits. The paranasal sinuses and mastoid air cells are well-aerated. The soft tissues are not well assessed due to overlying bandages.  IMPRESSION: 1. No evidence of traumatic intracranial injury or fracture. 2. Mild to moderate cortical volume loss and scattered small vessel ischemic microangiopathy. Chronic ischemic change at the external capsule bilaterally. 3. Soft tissue laceration not well assessed due to overlying bandages.   Electronically Signed   By: Roanna Raider M.D.   On: 08/28/2014 00:33    Assessment/plan  Physical deconditioning  Will have her work with  physical therapy and occupational therapy team to help with gait training and muscle strengthening exercises.fall precautions. Skin care. Encourage to be out of bed.   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 Continue lasix 20 mg daily for now and monitor bmp  Head laceration Staples has been removed, healing well, no headache, monitor, fall precautions  Blood loss anemia Drop in hb on review of lab. Hb 7.6 from 10.7. Hold aspirin for now, guaiac x 3 for now and check cbc 09/14/14  E.coli uti Currently asymptomatic. Continue and complete 1 week course of ciprofloxacin 500 mg bid and monitor  Leukocytosis Resolved. Likely from e.coli uti, afebrile, no signs of infection  Chronic constipation Continue colace 100 mg bid and monitor  Hypertension Stable, continue hydralazine 50 mg qid, coreg 3.125 mg bid and lasix 20 mg daily, monitor bp  Hypothyroidism  continue Synthroid 100 mcg daily   Goals of care:  Short-term rehabilitation   Labs/test ordered:  CBC, CMP   Family/ staff Communication: reviewed care plan with patient and nursing supervisor    Oneal Grout, MD  [facility] [phone] (Monday-Friday 8 am - 5 pm) [phone] (afterhours)

## 2014-09-15 ENCOUNTER — Non-Acute Institutional Stay (SKILLED_NURSING_FACILITY): Payer: Medicare Other | Admitting: Adult Health

## 2014-09-15 ENCOUNTER — Encounter: Payer: Self-pay | Admitting: Adult Health

## 2014-09-15 DIAGNOSIS — I10 Essential (primary) hypertension: Secondary | ICD-10-CM

## 2014-09-15 DIAGNOSIS — R5381 Other malaise: Secondary | ICD-10-CM | POA: Diagnosis not present

## 2014-09-15 DIAGNOSIS — E039 Hypothyroidism, unspecified: Secondary | ICD-10-CM | POA: Diagnosis not present

## 2014-09-15 DIAGNOSIS — E871 Hypo-osmolality and hyponatremia: Secondary | ICD-10-CM | POA: Diagnosis not present

## 2014-09-15 DIAGNOSIS — K5900 Constipation, unspecified: Secondary | ICD-10-CM | POA: Diagnosis not present

## 2014-09-15 NOTE — Progress Notes (Signed)
Patient ID: Bethany Blake, female   DOB: 03/28/1922, 81 y.o.   MRN: [id number]   09/15/2014  Facility:  Nursing Home Location:  [facility] Room Number: 606-P LEVEL OF CARE:  SNF (31)   Chief Complaint  Patient presents with  . Discharge Note    Physical deconditioning, hyponatremia, hypertension, constipation, hypothyroidism, leukocytosis, scalp laceration and acute kidney injury    HISTORY OF PRESENT ILLNESS:  This is a 81 year old female who is for discharge home with Home health PT for endurance, OT for ADLs, Nursing for disease management and CNA for showers. She has been admitted to [facility] on 09/01/14 from [facility]. She has PMH of hypertension, hypothyroidism, hyponatremia, arthritis and anxiety. She tripped over a piece of furniture and hit her head sustaining a scalp laceration S/P repair with 1 staple in ED. She has been having increased confusion and seen by her PCP on 08/19/14 and was told to eat pretzels due to hyponatremia. 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  Patient was admitted to this facility for short-term rehabilitation after the patient's recent hospitalization.  Patient has completed SNF rehabilitation and therapy has cleared the patient for discharge.  PAST MEDICAL HISTORY:  Past Medical History  Diagnosis Date  . Hypertension   . Hypothyroidism   . Hyponatremia   . Hypokalemia   . Arthritis   . Anxiety     CURRENT MEDICATIONS: Reviewed per MAR/see medication list  Allergies  Allergen Reactions  . Ace Inhibitors Other (See Comments)    Severe hyponatremia  . Aldactone [Spironolactone] Other (See Comments)    Severe hyponatremia  . Angiotensin Receptor Blockers Other (See Comments)    Severe hyponatremia  . Sulfa Antibiotics Rash     REVIEW OF SYSTEMS:  GENERAL: no change in appetite, no fatigue, no weight changes, no fever, chills or  weakness RESPIRATORY: no cough, SOB, DOE, wheezing, hemoptysis CARDIAC: no chest pain, or palpitations GI: no abdominal pain, diarrhea, constipation, heart burn, nausea or vomiting  PHYSICAL EXAMINATION  GENERAL: no acute distress, normal body habitus SKIN:  Has dried blood on scalp with 1 staple EYES: conjunctivae normal, sclerae normal, normal eye lids NECK: supple, trachea midline, no neck masses, no thyroid tenderness, no thyromegaly LYMPHATICS: no LAN in the neck, no supraclavicular LAN RESPIRATORY: breathing is even & unlabored, BS CTAB CARDIAC: RRR, no murmur,no extra heart sounds, BLE edema 2+ GI: abdomen soft, normal BS, no masses, no tenderness, no hepatomegaly, no splenomegaly EXTREMITIES: Able to move 4 extremities PSYCHIATRIC: the patient is alert & oriented to person, affect & behavior appropriate  LABS/RADIOLOGY: Labs reviewed: Basic Metabolic Panel:  Recent Labs  61/95/09 0215 08/30/14 0800 08/31/14 0515 09/03/14 1041  NA 123* 126* 129* 132*  K 4.0 4.0 4.0 3.9  CL 95* 96* 97*  --   CO2 20* 21* 23  --   GLUCOSE 101* 98 102*  --   BUN 18 16 15  23*  CREATININE 1.04* 1.12* 1.18* 1.5*  CALCIUM 8.3* 8.3* 8.5*  --    Liver Function Tests:  Recent Labs  08/28/14 0026 09/03/14 1041  AST 41 15  ALT 25 11  ALKPHOS 57 39  BILITOT 1.4*  --   PROT 6.6  --   ALBUMIN 4.2  --    CBC:  Recent Labs  08/28/14 0026 08/28/14 0525 09/03/14 1041  WBC 13.2* 13.4* 6.4  NEUTROABS 10.6*  --   --  HGB 11.6* 10.7* 7.6*  HCT 31.7* 29.4* 22*  MCV 81.5 80.3  --   PLT 253 225 239   CBG:  Recent Labs  08/28/14 0150  GLUCAP 223*    Dg Chest 1 View  08/28/2014   CLINICAL DATA:  Hyponatremia.  EXAM: CHEST  1 VIEW  COMPARISON:  Frontal and lateral views 01/03/2011  FINDINGS: The patient is rotated. The mediastinal contours and mild cardiomegaly are unchanged. There is elevation of left hemidiaphragm with adjacent atelectasis or scarring. No consolidation to suggest  pneumonia. No pulmonary edema, pleural effusion or pneumothorax. Chronic change about the right shoulder.  IMPRESSION: Stable mild cardiomegaly. No acute pulmonary process or congestive heart failure.   Electronically Signed   By: Rubye Oaks M.D.   On: 08/28/2014 03:46   Ct Head Wo Contrast  08/28/2014   CLINICAL DATA:  Status post fall, with posterior head laceration. Initial encounter.  EXAM: CT HEAD WITHOUT CONTRAST  TECHNIQUE: Contiguous axial images were obtained from the base of the skull through the vertex without intravenous contrast.  COMPARISON:  CT of the head performed 01/03/2011  FINDINGS: There is no evidence of acute infarction, mass lesion, or intra- or extra-axial hemorrhage on CT.  Prominence of the sulci suggests mild to moderate cortical volume loss. Mild periventricular white matter change likely reflects small vessel ischemic microangiopathy. Mild cerebellar atrophy is noted. Chronic ischemic change is noted at the external capsule bilaterally.  The brainstem and fourth ventricle are within normal limits. The basal ganglia are unremarkable in appearance. The cerebral hemispheres demonstrate grossly normal gray-white differentiation. No mass effect or midline shift is seen.  There is no evidence of fracture; visualized osseous structures are unremarkable in appearance. The visualized portions of the orbits are within normal limits. The paranasal sinuses and mastoid air cells are well-aerated. The soft tissues are not well assessed due to overlying bandages.  IMPRESSION: 1. No evidence of traumatic intracranial injury or fracture. 2. Mild to moderate cortical volume loss and scattered small vessel ischemic microangiopathy. Chronic ischemic change at the external capsule bilaterally. 3. Soft tissue laceration not well assessed due to overlying bandages.   Electronically Signed   By: Roanna Raider M.D.   On: 08/28/2014 00:33    ASSESSMENT/PLAN:  Physical deconditioning - for home  health PT, OT, Nursing and CNA Hyponatremia - possibly due to SIADH, follow-up with Dr. Allena Katz, nephrologist,  NA 132; was given 3% NS in hospital; recently decreased Lasix 20 mg 1 tab PO to Q OD Hypertension - well controlled; continue Lasix 20 mg 1 tab PO QOD,  Coreg 3.125 mg 1 tab by mouth twice a day and hydralazine 50 mg by mouth every 8 hours Constipation - continue Colace to 100 mg by mouth twice a day Acute Kidney injury -  Creatinine 1.09; resolved Hypothyroidism - tsh 1.604; continue Synthroid 100 mcg 1 tab PO Q D Leukocytosis - wbc 4.7; resolved Scalp laceration - healed  I have filled out patient's discharge paperwork and written prescriptions.  Patient will receive home health PT, OT, Nursing and CNA.  Total discharge time: Less than 30 minutes  Discharge time involved coordination of the discharge process with Child psychotherapist, nursing staff and therapy department. Medical justification for home health services verified.   [facility], NP [employer] [phone]

## 2014-10-08 ENCOUNTER — Other Ambulatory Visit: Payer: Self-pay | Admitting: Adult Health

## 2014-10-20 ENCOUNTER — Encounter: Payer: Self-pay | Admitting: Podiatry

## 2014-10-20 ENCOUNTER — Ambulatory Visit (INDEPENDENT_AMBULATORY_CARE_PROVIDER_SITE_OTHER): Payer: Medicare Other | Admitting: Podiatry

## 2014-10-20 VITALS — BP 100/52 | HR 84 | Resp 12

## 2014-10-20 DIAGNOSIS — B351 Tinea unguium: Secondary | ICD-10-CM | POA: Diagnosis not present

## 2014-10-20 DIAGNOSIS — M79676 Pain in unspecified toe(s): Secondary | ICD-10-CM

## 2014-10-20 DIAGNOSIS — L84 Corns and callosities: Secondary | ICD-10-CM | POA: Diagnosis not present

## 2014-10-20 NOTE — Progress Notes (Signed)
   Subjective:    Patient ID: Bethany Blake, female    DOB: [birth year], 81 y.o.   MRN: [id number]  HPI  PT REQUESTING FOR TOENAILS AND SKIN DEBRIDEMENT. Her daughter states that her toenails are uncomfortable in the last visit for a similar service was on 12/04/2013  Patient's daughter says that she's under the care of your neurologist for suspect kidney dysfunction  Review of Systems  Cardiovascular: Positive for leg swelling.  Musculoskeletal: Positive for gait problem.       Objective:   Physical Exam  Patient is slow in responding to questioning and her daughter is present in the treatment room  Vascular: Peripheral pitting edema bilaterally DP pulses 2/4 right and 1/4 left PT pulses 1/4 bilaterally  Neurological: Sensation to 10 g monofilament wire intact 5/5 bilaterally Vibratory sensation reactive bilaterally Ankle reflexes reactive bilaterally  Dermatological: The toenails are elongated, incurvated, discolored, hypertrophic and tender direct palpation 6-10 Plantar keratoses subsecond MPJ bilaterally  Musculoskeletal: Hammertoe third bilaterally       Assessment & Plan:   Assessment: Peripheral edema bilaterally Symptomatic onychomycoses 6-10 Plantar keratoses 2  Plan: Debridement of toenails mechanically allegedly without any bleeding Debrided plantar keratoses 2 without a bleeding  Reappoin when necessary or t at three-month intervals

## 2015-04-13 ENCOUNTER — Ambulatory Visit (INDEPENDENT_AMBULATORY_CARE_PROVIDER_SITE_OTHER): Payer: Medicare (Managed Care) | Admitting: Podiatry

## 2015-04-13 ENCOUNTER — Encounter: Payer: Self-pay | Admitting: Podiatry

## 2015-04-13 DIAGNOSIS — B351 Tinea unguium: Secondary | ICD-10-CM | POA: Diagnosis not present

## 2015-04-13 DIAGNOSIS — L84 Corns and callosities: Secondary | ICD-10-CM

## 2015-04-13 DIAGNOSIS — M79676 Pain in unspecified toe(s): Secondary | ICD-10-CM | POA: Diagnosis not present

## 2015-04-13 NOTE — Patient Instructions (Signed)
Return as needed for trimming of thickened fungal toenails and right and left feet and calluses on the ball area of the right and left feet

## 2015-04-13 NOTE — Progress Notes (Signed)
Patient ID: Bethany Blake, female   DOB: 05/12/1921, 82 y.o.   MRN: [id number]  Subjective: This patient presents today stating that her toenails are thickened and elongated and a cough walking wearing shoes and requests toenail debridement  Objective: Patient appears responsive orientated 3 No open skin lesions bilaterally The toenails are elongated, brittle, deformed, hypertrophic and tender to direct palpation 6-10  Assessment: Symptomatic onychomycoses 6-10  Plan: Debridement toenails 6-10 mechanically and electronically without any bleeding  Reappoint at patient's request

## 2015-07-28 ENCOUNTER — Ambulatory Visit: Payer: Medicare (Managed Care) | Admitting: Podiatry

## 2015-08-10 ENCOUNTER — Encounter: Payer: Self-pay | Admitting: Podiatry

## 2015-08-10 ENCOUNTER — Ambulatory Visit (INDEPENDENT_AMBULATORY_CARE_PROVIDER_SITE_OTHER): Payer: Medicare (Managed Care) | Admitting: Podiatry

## 2015-08-10 DIAGNOSIS — L84 Corns and callosities: Secondary | ICD-10-CM | POA: Diagnosis not present

## 2015-08-10 DIAGNOSIS — B351 Tinea unguium: Secondary | ICD-10-CM

## 2015-08-10 DIAGNOSIS — M79676 Pain in unspecified toe(s): Secondary | ICD-10-CM

## 2015-08-10 NOTE — Progress Notes (Signed)
Patient ID: Bethany Blake, female   DOB: [birth year], 82 y.o.   MRN: [id number]  Subjective: This patient presents again today complaining of uncomfortable toenails when walking wearing shoes and requests nail debridement. Also patient complaining of painful corn and callus  Objective: Orientated 3 Vascular: Peripheral pitting edema bilaterally DP pulses 2/4 right and 1/4 left PT pulses 1/4 bilaterally  Neurological: Sensation to 10 g monofilament wire intact 5/5 bilaterally Vibratory sensation reactive bilaterally Ankle reflexes reactive bilaterally  Dermatological: The toenails are elongated, incurvated, discolored, hypertrophic and tender direct palpation 6-10 Plantar keratoses subsecond MPJ, left Corn fifth right toe  Assessment: Symptomatic onychomycoses 6-10 Plantar callus left 1 Corn fifth right toe 1  Plan: Debridement of toenails 6-10 mechanically and electrically without any bleeding Debrided keratoses 2 without any bleeding  Reappoint as 3 months

## 2015-11-09 ENCOUNTER — Ambulatory Visit: Payer: Medicare (Managed Care) | Admitting: Podiatry

## 2015-12-07 ENCOUNTER — Ambulatory Visit (INDEPENDENT_AMBULATORY_CARE_PROVIDER_SITE_OTHER): Payer: Medicare (Managed Care) | Admitting: Podiatry

## 2015-12-07 ENCOUNTER — Encounter: Payer: Self-pay | Admitting: Podiatry

## 2015-12-07 DIAGNOSIS — M79676 Pain in unspecified toe(s): Secondary | ICD-10-CM

## 2015-12-07 DIAGNOSIS — B351 Tinea unguium: Secondary | ICD-10-CM | POA: Diagnosis not present

## 2015-12-07 DIAGNOSIS — L84 Corns and callosities: Secondary | ICD-10-CM | POA: Diagnosis not present

## 2015-12-07 NOTE — Progress Notes (Signed)
Patient ID: Bethany Blake, female   DOB: 10-28-1921, 82 y.o.   MRN: [id number]      Subjective: This patient presents again today complaining of uncomfortable toenails when walking wearing shoes and requests nail debridement. Also patient complaining of painful corn and callus  Objective: Orientated 3 Vascular: Peripheral pitting edema bilaterally DP pulses 2/4 right and 1/4 left PT pulses 1/4 bilaterally  Neurological: Sensation to 10 g monofilament wire intact 5/5 bilaterally Vibratory sensation reactive bilaterally Ankle reflexes reactive bilaterally  Dermatological: The toenails are elongated, incurvated, discolored, hypertrophic and tender direct palpation 6-10 Corn fifth right toe  Assessment: Symptomatic onychomycoses 6-10 Corn fifth right toe 1  Plan: Debridement of toenails 6-10 mechanically and electrically without any bleeding Debrided keratoses 1 without any bleeding  Reappoint as 3 months

## 2016-03-07 ENCOUNTER — Ambulatory Visit (INDEPENDENT_AMBULATORY_CARE_PROVIDER_SITE_OTHER): Payer: Medicare (Managed Care) | Admitting: Podiatry

## 2016-03-07 ENCOUNTER — Encounter: Payer: Self-pay | Admitting: Podiatry

## 2016-03-07 VITALS — BP 99/66 | HR 83 | Resp 14

## 2016-03-07 DIAGNOSIS — B351 Tinea unguium: Secondary | ICD-10-CM

## 2016-03-07 DIAGNOSIS — M79676 Pain in unspecified toe(s): Secondary | ICD-10-CM | POA: Diagnosis not present

## 2016-03-07 DIAGNOSIS — L84 Corns and callosities: Secondary | ICD-10-CM

## 2016-03-07 NOTE — Progress Notes (Signed)
Patient ID: Bethany Blake, female   DOB: 05-11-1921, 82 y.o.   MRN: [id number]   Subjective: This patient presents again today complaining of uncomfortable toenails when walking wearing shoes and requests nail debridement. Also patient complaining of painful corn and callus  Objective: Orientated 3 Vascular: Peripheral pitting edema bilaterally DP pulses 2/4 right and 1/4 left PT pulses 1/4 bilaterally Capillary reflex within normal bilaterally  Neurological: Sensation to 10 g monofilament wire intact 5/5 bilaterally Vibratory sensation reactive bilaterally Ankle reflexes reactive bilaterally  Dermatological: The toenails are elongated, incurvated, discolored, hypertrophic and tender direct palpation 6-10 Corns of toes bilaterally Nucleated keratoses second MPJ bilaterally  Musculoskeletal: HAV bilaterally Hammertoe second third bilaterally  Assessment: Symptomatic onychomycoses 6-10 Keratoses 4  Plan: Debridement of toenails 6-10 mechanically and electrically without any bleeding Debrided keratoses 4 without any bleeding  Reappoint as 3 months

## 2016-06-07 ENCOUNTER — Encounter: Payer: Self-pay | Admitting: Podiatry

## 2016-06-07 ENCOUNTER — Ambulatory Visit (INDEPENDENT_AMBULATORY_CARE_PROVIDER_SITE_OTHER): Payer: Medicare (Managed Care) | Admitting: Podiatry

## 2016-06-07 VITALS — BP 142/65 | HR 91 | Resp 18

## 2016-06-07 DIAGNOSIS — B351 Tinea unguium: Secondary | ICD-10-CM

## 2016-06-07 DIAGNOSIS — M79676 Pain in unspecified toe(s): Secondary | ICD-10-CM | POA: Diagnosis not present

## 2016-06-07 DIAGNOSIS — L84 Corns and callosities: Secondary | ICD-10-CM | POA: Diagnosis not present

## 2016-06-07 NOTE — Progress Notes (Signed)
Patient ID: Bethany Blake, female   DOB: May 22, 1921, 83 y.o.   MRN: [id number]    Subjective: This patient presents again today complaining of uncomfortable toenails when walking wearing shoes and requests nail debridement. Also patient complaining of painful corn and callus Patient's son is present in the treatment room  Objective: Orientated 3 Vascular: Peripheral pitting edema bilaterally DP pulses 2/4 right and 1/4 left PT pulses 1/4 bilaterally Capillary reflex within normal bilaterally  Neurological: Sensation to 10 g monofilament wire intact 5/5 bilaterally Vibratory sensation reactive bilaterally Ankle reflexes reactive bilaterally  Dermatological: The toenails are elongated, incurvated, discolored, hypertrophic and tender direct palpation 6-10 Corns of toes bilaterally Nucleated keratoses second MPJ bilaterally  Musculoskeletal: HAV bilaterally Hammertoe second third bilaterally  Assessment: Symptomatic onychomycoses 6-10 Keratoses 4  Plan: Debridement of toenails 6-10 mechanically and electrically without any bleeding Debrided keratoses 4without any bleeding  Reappoint as 3 months

## 2016-09-06 ENCOUNTER — Encounter: Payer: Self-pay | Admitting: Podiatry

## 2016-09-06 ENCOUNTER — Ambulatory Visit (INDEPENDENT_AMBULATORY_CARE_PROVIDER_SITE_OTHER): Payer: Medicare (Managed Care) | Admitting: Podiatry

## 2016-09-06 VITALS — BP 159/76 | HR 77 | Resp 18

## 2016-09-06 DIAGNOSIS — M79676 Pain in unspecified toe(s): Secondary | ICD-10-CM | POA: Diagnosis not present

## 2016-09-06 DIAGNOSIS — L84 Corns and callosities: Secondary | ICD-10-CM

## 2016-09-06 DIAGNOSIS — B351 Tinea unguium: Secondary | ICD-10-CM

## 2016-09-06 NOTE — Progress Notes (Signed)
Patient ID: Bethany Blake, female   DOB: 06-13-1921, 83 y.o.   MRN: [id number]    Subjective: This patient presents again today complaining of uncomfortable toenails when walking wearing shoes and requests nail debridement. Also patient complaining of painful corn and callus Patient's son is present in the treatment room  Objective: Orientated 3 Vascular: Peripheral pitting edema bilaterally DP pulses 2/4 right and 1/4 left PT pulses 1/4 bilaterally Capillary reflex within normal bilaterally  Neurological: Sensation to 10 g monofilament wire intact 5/5 bilaterally Vibratory sensation reactive bilaterally Ankle reflexes reactive bilaterally  Dermatological: The toenails are elongated, incurvated, discolored, hypertrophic and tender direct palpation 6-10 Corns of toes bilaterally Nucleated keratoses second MPJ left, and diffuse plantar callus subsecond MPJ right  Musculoskeletal: HAV bilaterally Hammertoe second third bilaterally  Assessment: Symptomatic onychomycoses 6-10 Keratoses 4  Plan: Debridement of toenails 6-10 mechanically and electrically without any bleeding Debrided keratoses 4without any bleeding  Reappoint as 3 months

## 2016-12-05 ENCOUNTER — Ambulatory Visit: Payer: Medicare (Managed Care) | Admitting: Podiatry

## 2017-01-15 ENCOUNTER — Ambulatory Visit: Payer: Medicare (Managed Care) | Admitting: Podiatry

## 2017-02-06 ENCOUNTER — Encounter: Payer: Self-pay | Admitting: Podiatry

## 2017-02-06 ENCOUNTER — Ambulatory Visit (INDEPENDENT_AMBULATORY_CARE_PROVIDER_SITE_OTHER): Payer: Medicare (Managed Care) | Admitting: Podiatry

## 2017-02-06 DIAGNOSIS — L84 Corns and callosities: Secondary | ICD-10-CM

## 2017-02-06 DIAGNOSIS — B351 Tinea unguium: Secondary | ICD-10-CM | POA: Diagnosis not present

## 2017-02-06 DIAGNOSIS — M79675 Pain in left toe(s): Secondary | ICD-10-CM

## 2017-02-06 DIAGNOSIS — M79674 Pain in right toe(s): Secondary | ICD-10-CM | POA: Diagnosis not present

## 2017-02-06 NOTE — Progress Notes (Signed)
Patient ID: Bethany Blake, female   DOB: 21-Feb-1922, 83 y.o.   MRN: [id number]    Subjective: This patient presents again today complaining of uncomfortable toenails when walking wearing shoes and requests nail debridement. Also patient complaining of painful corn and callus Patient's son is present in the treatment room  Objective: Orientated 3 Vascular: Peripheral pitting edema bilaterally DP pulses 2/4 right and 1/4 left PT pulses 1/4 bilaterally Capillary reflex within normal bilaterally  Neurological: Sensation to 10 g monofilament wire intact 5/5 bilaterally Vibratory sensation reactive bilaterally Ankle reflexes reactive bilaterally  Dermatological: The toenails are elongated, incurvated, discolored, hypertrophic and tender direct palpation 6-10 Corns of toes bilaterally Nucleated keratoses second MPJ left, and diffuse plantar callus subsecond MPJ right  Musculoskeletal: HAV bilaterally Hammertoe second third bilaterally  Assessment: Symptomatic onychomycoses 6-10 Keratoses 4  Plan: Debridement of toenails 6-10 mechanically and electrically with slight bleeding first left toe treated with topical antibiotic ointment and Band-Aid. Patient instructed with Band-Aid 1-3 days continue to apply topical antibiotic ointment and Band-Aid until a scab forms  Debrided keratoses 4without any bleeding  Reappoint as 3 months

## 2017-02-06 NOTE — Patient Instructions (Signed)
Removed Band-Aid on left great toe 1-3 days and apply topical antibiotic ointment and Band-Aid daily until a scab forms 

## 2017-05-15 ENCOUNTER — Ambulatory Visit: Payer: Medicare (Managed Care) | Admitting: Podiatry

## 2017-05-18 ENCOUNTER — Encounter: Payer: Self-pay | Admitting: Podiatry

## 2017-05-18 ENCOUNTER — Ambulatory Visit: Payer: Medicare Other | Admitting: Podiatry

## 2017-05-18 DIAGNOSIS — B351 Tinea unguium: Secondary | ICD-10-CM

## 2017-05-18 DIAGNOSIS — M79676 Pain in unspecified toe(s): Secondary | ICD-10-CM

## 2017-05-28 NOTE — Progress Notes (Signed)
  Subjective:  Patient ID: Bethany Blake, female    DOB: 1922/02/25,  MRN: [id number]  Chief Complaint  Patient presents with  . Debridement    Trim toenails   84 y.o. female returns for nail care.  Reports elongated thickened toenails are uncomfortable and cause pain while she walks.  Objective:   General AA&O x3. Normal mood and affect.  Vascular Dorsalis pedis pulses present 1+ bilaterally  Posterior tibial pulses +1 bilaterally  Capillary refill normal to all digits. Pedal hair growth normal.  Neurologic Epicritic sensation present bilaterally. Protective sensation with 5.07 monofilament  present bilaterally. Vibratory sensation present bilaterally.  Dermatologic No open lesions. Interspaces clear of maceration.  Normal skin temperature and turgor. Hyperkeratotic lesions: None bilaterally. Nails: brittle, onychomycosis, thickening, elongation  Orthopedic: No history of amputation. MMT 5/5 in dorsiflexion, plantarflexion, inversion, and eversion. Normal lower extremity joint ROM without pain or crepitus.     Assessment & Plan:  Patient was evaluated and treated and all questions answered.  Nails debrided  Onychomycosis with pain -X10 due to pain  Procedure: Nail Debridement Rationale: Pain Type of Debridement: manual, sharp debridement. Instrumentation: Nail nipper, rotary burr. Number of Nails: 10  Return in about 3 months (around 08/15/2017) for Routine Foot Care.

## 2017-08-17 ENCOUNTER — Encounter: Payer: Self-pay | Admitting: Podiatry

## 2017-08-17 ENCOUNTER — Ambulatory Visit: Payer: Medicare Other | Admitting: Podiatry

## 2017-08-17 DIAGNOSIS — L84 Corns and callosities: Secondary | ICD-10-CM

## 2017-08-17 DIAGNOSIS — M79676 Pain in unspecified toe(s): Secondary | ICD-10-CM

## 2017-08-17 DIAGNOSIS — B351 Tinea unguium: Secondary | ICD-10-CM | POA: Diagnosis not present

## 2017-08-17 NOTE — Progress Notes (Addendum)
This patient presents to the office with chief complaint of long thick painful nails.  She previously was a patient of Dr. Leeanne Deed.  She presents the office today desiring her nails to be treated.  She says that she does not want her calluses to be treated.  She says the calluses are more painful after treatment.  She says her nails painful walking and wearing her shoes. . She is unable to self treat.  She presents the office today for preventative  foot care services.   General Appearance  Alert, conversant and in no acute stress.  Vascular  Dorsalis pedis  are palpable  bilaterally.  Posterior tibial pulses are not palpable  B/L.  Capillary return is within normal limits  bilaterally. Temperature is within normal limits  bilaterally.  Neurologic  Senn-Weinstein monofilament wire test within normal limits  bilaterally. Muscle power within normal limits bilaterally.  Nails Thick disfigured discolored nails with subungual debris  from hallux to fifth toes bilaterally. No evidence of bacterial infection or drainage bilaterally.  Orthopedic  No limitations of motion of motion feet .  No crepitus or effusions noted.  No bony pathology or digital deformities noted.  Skin  normotropic skin noted bilaterally.  No signs of infections or ulcers noted.  Porokeratosis forefoot  B/L.  Debridement and grinding of long thick painful nails.  RTC 4 months.   Helane Gunther DPM

## 2017-12-14 ENCOUNTER — Ambulatory Visit: Payer: Medicare Other | Admitting: Podiatry

## 2017-12-14 ENCOUNTER — Encounter: Payer: Self-pay | Admitting: Podiatry

## 2017-12-14 DIAGNOSIS — B351 Tinea unguium: Secondary | ICD-10-CM

## 2017-12-14 DIAGNOSIS — M79676 Pain in unspecified toe(s): Secondary | ICD-10-CM | POA: Diagnosis not present

## 2017-12-14 NOTE — Progress Notes (Signed)
This patient presents to the office with chief complaint of long thick painful nails.  She previously was a patient of Dr. Tuchman.  She presents the office today desiring her nails to be treated.  She says that she does not want her calluses to be treated.  She says the calluses are more painful after treatment.  She says her nails painful walking and wearing her shoes. . She is unable to self treat.  She presents the office today for preventative  foot care services.   General Appearance  Alert, conversant and in no acute stress.  Vascular  Dorsalis pedis  are palpable  bilaterally.  Posterior tibial pulses are not palpable  B/L.  Capillary return is within normal limits  bilaterally. Temperature is within normal limits  bilaterally.  Neurologic  Senn-Weinstein monofilament wire test within normal limits  bilaterally. Muscle power within normal limits bilaterally.  Nails Thick disfigured discolored nails with subungual debris  from hallux to fifth toes bilaterally. No evidence of bacterial infection or drainage bilaterally.  Orthopedic  No limitations of motion of motion feet .  No crepitus or effusions noted.  No bony pathology or digital deformities noted.  Skin  normotropic skin noted bilaterally.  No signs of infections or ulcers noted.  Porokeratosis forefoot  B/L.   Onychomycosis  B/L  Debridement and grinding of long thick painful nails.  RTC 4 months.   Ahlaya Ende DPM 

## 2018-04-16 ENCOUNTER — Ambulatory Visit: Payer: Medicare Other | Admitting: Podiatry

## 2018-04-16 ENCOUNTER — Encounter: Payer: Self-pay | Admitting: Podiatry

## 2018-04-16 DIAGNOSIS — B351 Tinea unguium: Secondary | ICD-10-CM

## 2018-04-16 DIAGNOSIS — M79676 Pain in unspecified toe(s): Secondary | ICD-10-CM

## 2018-04-16 DIAGNOSIS — L84 Corns and callosities: Secondary | ICD-10-CM

## 2018-04-16 NOTE — Progress Notes (Signed)
This patient presents to the office with chief complaint of long thick painful nails.  She previously was a patient of Dr. Tuchman.  She presents the office today desiring her nails to be treated.  She says that she does not want her calluses to be treated.  She says the calluses are more painful after treatment.  She says her nails painful walking and wearing her shoes. . She is unable to self treat.  She presents the office today for preventative  foot care services.   General Appearance  Alert, conversant and in no acute stress.  Vascular  Dorsalis pedis  are palpable  bilaterally.  Posterior tibial pulses are not palpable  B/L.  Capillary return is within normal limits  bilaterally. Temperature is within normal limits  bilaterally.  Neurologic  Senn-Weinstein monofilament wire test within normal limits  bilaterally. Muscle power within normal limits bilaterally.  Nails Thick disfigured discolored nails with subungual debris  from hallux to fifth toes bilaterally. No evidence of bacterial infection or drainage bilaterally.  Orthopedic  No limitations of motion of motion feet .  No crepitus or effusions noted.  No bony pathology or digital deformities noted.  Skin  normotropic skin noted bilaterally.  No signs of infections or ulcers noted.  Porokeratosis forefoot  B/L.   Onychomycosis  B/L  Debridement and grinding of long thick painful nails.  RTC 4 months.   Kenedy Haisley DPM 

## 2018-07-17 ENCOUNTER — Ambulatory Visit: Payer: Medicare Other | Admitting: Podiatry

## 2018-10-23 ENCOUNTER — Ambulatory Visit: Payer: Medicare Other | Admitting: Podiatry

## 2018-10-23 ENCOUNTER — Other Ambulatory Visit: Payer: Self-pay

## 2018-10-23 ENCOUNTER — Encounter: Payer: Self-pay | Admitting: Podiatry

## 2018-10-23 VITALS — Temp 97.8°F

## 2018-10-23 DIAGNOSIS — L84 Corns and callosities: Secondary | ICD-10-CM

## 2018-10-23 DIAGNOSIS — B351 Tinea unguium: Secondary | ICD-10-CM | POA: Diagnosis not present

## 2018-10-23 DIAGNOSIS — M79676 Pain in unspecified toe(s): Secondary | ICD-10-CM | POA: Diagnosis not present

## 2018-10-23 NOTE — Progress Notes (Signed)
This patient presents to the office with chief complaint of long thick painful nails.  She previously was a patient of Dr. Tuchman.  She presents the office today desiring her nails to be treated.  She says that she does not want her calluses to be treated.  She says the calluses are more painful after treatment.  She says her nails painful walking and wearing her shoes. . She is unable to self treat.  She presents the office today for preventative  foot care services.   General Appearance  Alert, conversant and in no acute stress.  Vascular  Dorsalis pedis  are palpable  bilaterally.  Posterior tibial pulses are not palpable  B/L.  Capillary return is within normal limits  bilaterally. Temperature is within normal limits  bilaterally.  Neurologic  Senn-Weinstein monofilament wire test within normal limits  bilaterally. Muscle power within normal limits bilaterally.  Nails Thick disfigured discolored nails with subungual debris  from hallux to fifth toes bilaterally. No evidence of bacterial infection or drainage bilaterally.  Orthopedic  No limitations of motion of motion feet .  No crepitus or effusions noted.  No bony pathology or digital deformities noted.  Skin  normotropic skin noted bilaterally.  No signs of infections or ulcers noted.  Porokeratosis forefoot  B/L.   Onychomycosis  B/L  Debridement and grinding of long thick painful nails.  RTC 4 months.   Janette Harvie DPM 

## 2018-11-06 ENCOUNTER — Ambulatory Visit
Admission: RE | Admit: 2018-11-06 | Discharge: 2018-11-06 | Disposition: A | Discharge status: Home / Self Care | Payer: Medicare (Managed Care) | Source: Ambulatory Visit | Attending: Internal Medicine | Admitting: Internal Medicine

## 2018-11-06 ENCOUNTER — Other Ambulatory Visit: Payer: Self-pay

## 2018-11-06 ENCOUNTER — Other Ambulatory Visit: Payer: Self-pay | Admitting: Internal Medicine

## 2018-11-06 DIAGNOSIS — R52 Pain, unspecified: Secondary | ICD-10-CM

## 2019-01-07 ENCOUNTER — Emergency Department (HOSPITAL_COMMUNITY): Payer: Medicare Other

## 2019-01-07 ENCOUNTER — Emergency Department (HOSPITAL_COMMUNITY)
Admission: EM | Admit: 2019-01-07 | Discharge: 2019-01-07 | Disposition: A | Discharge status: Home / Self Care | Payer: Medicare Other | Attending: Emergency Medicine | Admitting: Emergency Medicine

## 2019-01-07 DIAGNOSIS — Y999 Unspecified external cause status: Secondary | ICD-10-CM | POA: Diagnosis not present

## 2019-01-07 DIAGNOSIS — N189 Chronic kidney disease, unspecified: Secondary | ICD-10-CM | POA: Diagnosis not present

## 2019-01-07 DIAGNOSIS — Z7982 Long term (current) use of aspirin: Secondary | ICD-10-CM | POA: Insufficient documentation

## 2019-01-07 DIAGNOSIS — Y9301 Activity, walking, marching and hiking: Secondary | ICD-10-CM | POA: Diagnosis not present

## 2019-01-07 DIAGNOSIS — W010XXA Fall on same level from slipping, tripping and stumbling without subsequent striking against object, initial encounter: Secondary | ICD-10-CM | POA: Diagnosis not present

## 2019-01-07 DIAGNOSIS — Y929 Unspecified place or not applicable: Secondary | ICD-10-CM | POA: Diagnosis not present

## 2019-01-07 DIAGNOSIS — W19XXXA Unspecified fall, initial encounter: Secondary | ICD-10-CM

## 2019-01-07 DIAGNOSIS — Z79899 Other long term (current) drug therapy: Secondary | ICD-10-CM | POA: Insufficient documentation

## 2019-01-07 DIAGNOSIS — E039 Hypothyroidism, unspecified: Secondary | ICD-10-CM | POA: Insufficient documentation

## 2019-01-07 DIAGNOSIS — S0990XA Unspecified injury of head, initial encounter: Secondary | ICD-10-CM | POA: Diagnosis present

## 2019-01-07 DIAGNOSIS — F0390 Unspecified dementia without behavioral disturbance: Secondary | ICD-10-CM | POA: Insufficient documentation

## 2019-01-07 DIAGNOSIS — S0181XA Laceration without foreign body of other part of head, initial encounter: Secondary | ICD-10-CM

## 2019-01-07 DIAGNOSIS — S0101XA Laceration without foreign body of scalp, initial encounter: Secondary | ICD-10-CM | POA: Diagnosis not present

## 2019-01-07 DIAGNOSIS — Z87891 Personal history of nicotine dependence: Secondary | ICD-10-CM | POA: Insufficient documentation

## 2019-01-07 DIAGNOSIS — I129 Hypertensive chronic kidney disease with stage 1 through stage 4 chronic kidney disease, or unspecified chronic kidney disease: Secondary | ICD-10-CM | POA: Diagnosis not present

## 2019-01-07 LAB — CBC
HCT: 31.1 % — ABNORMAL LOW (ref 36.0–46.0)
Hemoglobin: 9.7 g/dL — ABNORMAL LOW (ref 12.0–15.0)
MCH: 24.7 pg — ABNORMAL LOW (ref 26.0–34.0)
MCHC: 31.2 g/dL (ref 30.0–36.0)
MCV: 79.3 fL — ABNORMAL LOW (ref 80.0–100.0)
Platelets: 249 10*3/uL (ref 150–400)
RBC: 3.92 MIL/uL (ref 3.87–5.11)
RDW: 18.4 % — ABNORMAL HIGH (ref 11.5–15.5)
WBC: 8.6 10*3/uL (ref 4.0–10.5)
nRBC: 0 % (ref 0.0–0.2)

## 2019-01-07 LAB — BASIC METABOLIC PANEL
Anion gap: 9 (ref 5–15)
BUN: 16 mg/dL (ref 8–23)
CO2: 27 mmol/L (ref 22–32)
Calcium: 8.8 mg/dL — ABNORMAL LOW (ref 8.9–10.3)
Chloride: 94 mmol/L — ABNORMAL LOW (ref 98–111)
Creatinine, Ser: 0.84 mg/dL (ref 0.44–1.00)
GFR calc Af Amer: >60 mL/min (ref >60)
GFR calc non Af Amer: 59 mL/min — ABNORMAL LOW (ref >60)
Glucose, Bld: 129 mg/dL — ABNORMAL HIGH (ref 70–99)
Potassium: 3.3 mmol/L — ABNORMAL LOW (ref 3.5–5.1)
Sodium: 130 mmol/L — ABNORMAL LOW (ref 135–145)

## 2019-01-07 MED ORDER — ACETAMINOPHEN 325 MG PO TABS
650.0000 mg | ORAL_TABLET | Freq: Once | ORAL | Status: AC
  Administered 2019-01-07: 14:00:00 650 mg via ORAL
  Filled 2019-01-07: qty 2

## 2019-01-07 NOTE — Discharge Instructions (Addendum)
Follow-up with your primary care doctor in 7 days for staple removal. Use Tylenol and ibuprofen as needed for pain. Use ice to help with pain and swelling. Your imaging of your head, neck, and chest was normal.  Your labs were at your baseline. Return to the emergency room with any new, worsening, concerning symptoms.

## 2019-01-07 NOTE — ED Provider Notes (Signed)
[facility]-EMERGENCY DEPT Provider Note   CSN: [id number] Arrival date & time: 01/07/19  1135     History   Chief Complaint Chief Complaint  Patient presents with   Fall    HPI Bethany Blake is a 85 y.o. female presenting for evaluation after fall.  Level 5 caveat due to dementia.  Patient states that she fell, but she cannot remember why.  She hit her head, reporting pain only there.  She does not think she is on anticoagulation, but is not sure.  Additional history obtained from patient's daughter who was present at the time the incident.  Per daughter, patient's walker was moved below the curve so patient could step down.  The walker started to roll away, and when the daughter went to grab the walker, the patient fell.  She hit her head, but there is no loss of consciousness.  She is not on blood thinners.  Patient's mental status was at baseline.  Daughter states last time patient fell, it was due to hyponatremia.      HPI  Past Medical History:  Diagnosis Date   Anxiety    Arthritis    Chronic kidney disease    Hypertension    Hypokalemia    Hyponatremia    Hypothyroidism     Patient Active Problem List   Diagnosis Date Noted   Physical deconditioning 09/02/2014   Hyponatremia 08/28/2014   HTN (hypertension) 08/28/2014   Confusion 08/28/2014   AKI (acute kidney injury) (HCC) 08/28/2014   Hypochloremia 08/28/2014    Past Surgical History:  Procedure Laterality Date   SHOULDER SURGERY     WRIST SURGERY       OB History   No obstetric history on file.      Home Medications    Prior to Admission medications   Medication Sig Start Date End Date Taking? Authorizing Provider  acetaminophen (TYLENOL) 325 MG tablet Take 650 mg by mouth every 4 (four) hours as needed for moderate pain.   Yes [provider]  aspirin 81 MG tablet Take 81 mg by mouth daily.   Yes [provider]  carvedilol (COREG) 3.125  MG tablet Take 1 tablet (3.125 mg total) by mouth 2 (two) times daily with a meal. 08/31/14  Yes Leroy SeaSingh, Prashant K, MD  Cholecalciferol (VITAMIN D PO) Take 1 tablet by mouth daily.   Yes [provider]  docusate sodium (COLACE) 100 MG capsule Take 100 mg by mouth as needed for mild constipation.    Yes [provider]  furosemide (LASIX) 20 MG tablet Take 20 mg by mouth daily.  09/11/14  Yes Oneal GroutPandey, Mahima, MD  hydrALAZINE (APRESOLINE) 50 MG tablet Take 1 tablet (50 mg total) by mouth every 8 (eight) hours. Patient taking differently: Take 50 mg by mouth 3 (three) times daily.  08/31/14  Yes Leroy SeaSingh, Prashant K, MD  SYNTHROID 112 MCG tablet Take 112 mcg by mouth daily. 08/05/18  Yes [provider]    Family History Family History  Problem Relation Age of Onset   Colon cancer Neg Hx    Colon polyps Neg Hx    Rectal cancer Neg Hx    Stomach cancer Neg Hx     Social History Social History   Tobacco Use   Smoking status: Former Smoker   Smokeless tobacco: Never Used  Substance Use Topics   Alcohol use: No   Drug use: No     Allergies   Ace inhibitors, Aldactone [  spironolactone], Angiotensin receptor blockers, and Sulfa antibiotics   Review of Systems Review of Systems  Unable to perform ROS: Dementia  Skin: Positive for wound.  Neurological: Positive for headaches.     Physical Exam Updated Vital Signs BP (!) 196/94    Pulse 77    Temp 98.4 F (36.9 C) (Oral)    Resp 16    SpO2 99%   Physical Exam Vitals signs and nursing note reviewed.  Constitutional:      General: She is not in acute distress.    Appearance: She is well-developed.     Comments: Elderly female resting comfortably in the bed in no acute distress  HENT:     Head: Normocephalic.     Comments: Large hematoma to the occiput without active bleeding. No hemotympanum or nasal septal hematoma. Eyes:     Extraocular Movements: Extraocular movements intact.      Conjunctiva/sclera: Conjunctivae normal.     Pupils: Pupils are equal, round, and reactive to light.     Comments: EOMI and PERRLA  Neck:     Musculoskeletal: Normal range of motion and neck supple.  Cardiovascular:     Rate and Rhythm: Normal rate and regular rhythm.     Pulses: Normal pulses.  Pulmonary:     Effort: Pulmonary effort is normal. No respiratory distress.     Breath sounds: Normal breath sounds. No wheezing.     Comments: TTP of L sided chest wall. No deformity. Clear lung sounds in all fields.  Chest:     Chest wall: Tenderness present.  Abdominal:     General: There is no distension.     Palpations: Abdomen is soft. There is no mass.     Tenderness: There is no abdominal tenderness. There is no guarding or rebound.  Musculoskeletal: Normal range of motion.        General: No tenderness.     Comments: Strength intact x4.  Moving all extremities.  No obvious injury or deformity to the back or midline spine.  No contusions or abrasions. Pelvis stable and intact.   Skin:    General: Skin is warm and dry.     Capillary Refill: Capillary refill takes less than 2 seconds.  Neurological:     Mental Status: She is alert.     Comments: Alert to person and place. Baseline mental status per pt.       ED Treatments / Results  Labs (all labs ordered are listed, but only abnormal results are displayed) Labs Reviewed  CBC - Abnormal; Notable for the following components:      Result Value   Hemoglobin 9.7 (*)    HCT 31.1 (*)    MCV 79.3 (*)    MCH 24.7 (*)    RDW 18.4 (*)    All other components within normal limits  BASIC METABOLIC PANEL - Abnormal; Notable for the following components:   Sodium 130 (*)    Potassium 3.3 (*)    Chloride 94 (*)    Glucose, Bld 129 (*)    Calcium 8.8 (*)    GFR calc non Af Amer 59 (*)    All other components within normal limits    EKG None  Radiology Dg Ribs Unilateral W/chest Left  Result Date: 01/07/2019 CLINICAL DATA:   Fall.  Left-sided pain. EXAM: LEFT RIBS AND CHEST - 3+ VIEW COMPARISON:  Chest x-ray 08/28/2014. FINDINGS: Mediastinum hilar structures normal. Mild bibasilar subsegmental atelectasis. No prominent pleural effusion. No pneumothorax. No  displaced rib fracture. Thoracic spine scoliosis and degenerative change. IMPRESSION: No evidence of displaced rib fracture or pneumothorax. Mild bibasilar subsegmental atelectasis. Electronically Signed   By: Maisie Fus  Register   On: 01/07/2019 14:38   Ct Head Wo Contrast  Result Date: 01/07/2019 CLINICAL DATA:  Head trauma, status post fall EXAM: CT HEAD WITHOUT CONTRAST CT CERVICAL SPINE WITHOUT CONTRAST TECHNIQUE: Multidetector CT imaging of the head and cervical spine was performed following the standard protocol without intravenous contrast. Multiplanar CT image reconstructions of the cervical spine were also generated. COMPARISON:  None. FINDINGS: CT HEAD FINDINGS Brain: No evidence of acute infarction, hemorrhage, extra-axial collection, ventriculomegaly, or mass effect. Generalized cerebral atrophy. Periventricular white matter low attenuation likely secondary to microangiopathy. Vascular: Cerebrovascular atherosclerotic calcifications are noted. Skull: Negative for fracture or focal lesion. Sinuses/Orbits: Visualized portions of the orbits are unremarkable. Visualized portions of the paranasal sinuses and mastoid air cells are unremarkable. Other: Right parietal scalp contusion. CT CERVICAL SPINE FINDINGS Alignment: 3 mm anterolisthesis of C2 on C3 secondary to facet disease. 2 mm anterolisthesis of C3 on C4. Skull base and vertebrae: No acute fracture. No primary bone lesion or focal pathologic process. Soft tissues and spinal canal: No prevertebral fluid or swelling. No visible canal hematoma. Disc levels: Degenerative disease with disc height loss at C4-5, C5-6 and C6-7. at C2-3 there is moderate left facet arthropathy. At C3-4 there is moderate left facet arthropathy  and mild left foraminal stenosis. At C4-5 there is degenerative disc disease with disc height loss, bilateral uncovertebral degenerative changes and moderate right foraminal stenosis. At C5-6 there is degenerative disc disease with disc height loss, bilateral facet arthropathy and bilateral foraminal narrowing. At C6-7 there is degenerative disease with disc osteophyte complex. At C7-T1 there is bilateral facet arthropathy. Upper chest: Lung apices are clear. Other: Thoracic aortic atherosclerosis. Bilateral carotid artery atherosclerosis. IMPRESSION: 1. No acute intracranial pathology. 2.  No acute osseous injury of the cervical spine. 3. Cervical spine spondylosis as described above. 4.  Aortic Atherosclerosis (ICD 10- I 70.0). Electronically Signed   By: Elige Ko   On: 01/07/2019 12:41   Ct Cervical Spine Wo Contrast  Result Date: 01/07/2019 CLINICAL DATA:  Head trauma, status post fall EXAM: CT HEAD WITHOUT CONTRAST CT CERVICAL SPINE WITHOUT CONTRAST TECHNIQUE: Multidetector CT imaging of the head and cervical spine was performed following the standard protocol without intravenous contrast. Multiplanar CT image reconstructions of the cervical spine were also generated. COMPARISON:  None. FINDINGS: CT HEAD FINDINGS Brain: No evidence of acute infarction, hemorrhage, extra-axial collection, ventriculomegaly, or mass effect. Generalized cerebral atrophy. Periventricular white matter low attenuation likely secondary to microangiopathy. Vascular: Cerebrovascular atherosclerotic calcifications are noted. Skull: Negative for fracture or focal lesion. Sinuses/Orbits: Visualized portions of the orbits are unremarkable. Visualized portions of the paranasal sinuses and mastoid air cells are unremarkable. Other: Right parietal scalp contusion. CT CERVICAL SPINE FINDINGS Alignment: 3 mm anterolisthesis of C2 on C3 secondary to facet disease. 2 mm anterolisthesis of C3 on C4. Skull base and vertebrae: No acute  fracture. No primary bone lesion or focal pathologic process. Soft tissues and spinal canal: No prevertebral fluid or swelling. No visible canal hematoma. Disc levels: Degenerative disease with disc height loss at C4-5, C5-6 and C6-7. at C2-3 there is moderate left facet arthropathy. At C3-4 there is moderate left facet arthropathy and mild left foraminal stenosis. At C4-5 there is degenerative disc disease with disc height loss, bilateral uncovertebral degenerative changes and moderate right foraminal stenosis. At  C5-6 there is degenerative disc disease with disc height loss, bilateral facet arthropathy and bilateral foraminal narrowing. At C6-7 there is degenerative disease with disc osteophyte complex. At C7-T1 there is bilateral facet arthropathy. Upper chest: Lung apices are clear. Other: Thoracic aortic atherosclerosis. Bilateral carotid artery atherosclerosis. IMPRESSION: 1. No acute intracranial pathology. 2.  No acute osseous injury of the cervical spine. 3. Cervical spine spondylosis as described above. 4.  Aortic Atherosclerosis (ICD 10- I 70.0). Electronically Signed   By: Kathreen Devoid   On: 01/07/2019 12:41    Procedures .Marland KitchenLaceration Repair  Date/Time: 01/07/2019 3:13 PM Performed by: Franchot Heidelberg, PA-C Authorized by: Franchot Heidelberg, PA-C   Consent:    Consent obtained:  Verbal   Consent given by:  Patient   Risks discussed:  Infection, need for additional repair, nerve damage, poor wound healing, poor cosmetic result and pain Anesthesia (see MAR for exact dosages):    Anesthesia method:  None Laceration details:    Location:  Scalp   Scalp location:  Occipital   Wound length (cm): V shaped, 1 cm each side.   Depth (mm):  2 Repair type:    Repair type:  Simple Exploration:    Wound extent: no underlying fracture noted   Skin repair:    Repair method:  Staples   Number of staples:  3 Approximation:    Approximation:  Close Post-procedure details:    Dressing:  Open  (no dressing)   (including critical care time)  Medications Ordered in ED Medications  acetaminophen (TYLENOL) tablet 650 mg (650 mg Oral Given 01/07/19 1357)     Initial Impression / Assessment and Plan / ED Course  I have reviewed the triage vital signs and the nursing notes.  Pertinent labs & imaging results that were available during my care of the patient were reviewed by me and considered in my medical decision making (see chart for details).   Pt presenting for evaluation after a fall.  Physical exam reassuring, she appears nontoxic.  Will obtain CT head and neck due to fall and head injury.  As patient has tenderness palpation of the left ribs, will obtain x-ray.  After discussion with daughter, and due to patient's history of fall due to hyponatremia, will obtain basic labs, although fall does not appear to be mechanical.  Imaging negative for fracture, dislocation, or bleed.  Laceration repaired as described above.  Labs at patient's baseline.  Case discussed with attending, Dr. Tyrone Nine evaluated the patient.  At this time, patient appears safe for discharge.  Return precautions given.  Patient states she understands and agrees to plan.  Final Clinical Impressions(s) / ED Diagnoses   Final diagnoses:  Laceration of other part of head without foreign body, initial encounter  Fall, initial encounter    ED Discharge Orders    None       Franchot Heidelberg, PA-C 01/07/19 [place], [place], DO 01/08/19 [phone]

## 2019-01-07 NOTE — ED Triage Notes (Signed)
Transported by [employer] from Bethany Blake, resident at [facility]. Patient tripped and fell leaving the restaurant has laceration/ hematoma to back of head--bleeding controlled. Patient AO x 3 which is baseline. Denies any neck or back pain, LOC and does not take blood thinners.

## 2019-01-07 NOTE — ED Notes (Signed)
Daughter Jon Billings left her phone # [phone] and asked for a call with an update.

## 2019-01-07 NOTE — ED Notes (Signed)
ED Provider at bedside. 

## 2019-02-26 ENCOUNTER — Ambulatory Visit: Payer: Medicare Other | Admitting: Podiatry

## 2019-02-27 ENCOUNTER — Other Ambulatory Visit: Payer: Self-pay

## 2019-02-27 DIAGNOSIS — Z20822 Contact with and (suspected) exposure to covid-19: Secondary | ICD-10-CM

## 2019-03-02 LAB — NOVEL CORONAVIRUS, NAA: SARS-CoV-2, NAA: NOT DETECTED

## 2019-03-03 ENCOUNTER — Telehealth: Payer: Self-pay | Admitting: Internal Medicine

## 2019-03-03 NOTE — Telephone Encounter (Signed)
Patient informed of negative covid 19 result.  °

## 2019-04-16 ENCOUNTER — Encounter: Payer: Self-pay | Admitting: Podiatry

## 2019-04-16 ENCOUNTER — Other Ambulatory Visit: Payer: Self-pay

## 2019-04-16 ENCOUNTER — Ambulatory Visit: Payer: Medicare Other | Admitting: Podiatry

## 2019-04-16 DIAGNOSIS — M79676 Pain in unspecified toe(s): Secondary | ICD-10-CM

## 2019-04-16 DIAGNOSIS — B351 Tinea unguium: Secondary | ICD-10-CM

## 2019-04-16 DIAGNOSIS — L84 Corns and callosities: Secondary | ICD-10-CM

## 2019-04-16 NOTE — Progress Notes (Signed)
This patient presents to the office with chief complaint of long thick painful nails.  She previously was a patient of Dr. Leeanne Deed.  She presents the office today desiring her nails to be treated.  She says that she does not want her calluses to be treated.  She says the calluses are more painful after treatment.  She says her nails painful walking and wearing her shoes. . She is unable to self treat.  She presents the office today for preventative  foot care services.   General Appearance  Alert, conversant and in no acute stress.  Vascular  Dorsalis pedis  are palpable  bilaterally.  Posterior tibial pulses are not palpable  B/L.  Capillary return is within normal limits  bilaterally. Temperature is within normal limits  bilaterally.  Neurologic  Senn-Weinstein monofilament wire test within normal limits  bilaterally. Muscle power within normal limits bilaterally.  Nails Thick disfigured discolored nails with subungual debris  from hallux to fifth toes bilaterally. No evidence of bacterial infection or drainage bilaterally.  Orthopedic  No limitations of motion of motion feet .  No crepitus or effusions noted.  No bony pathology or digital deformities noted.  Skin  normotropic skin noted bilaterally.  No signs of infections or ulcers noted.  Porokeratosis forefoot  B/L.   Onychomycosis  B/L  Debridement and grinding of long thick painful nails.  RTC 4 months.   Helane Gunther DPM

## 2019-05-06 ENCOUNTER — Ambulatory Visit: Payer: Medicare (Managed Care)

## 2019-08-13 ENCOUNTER — Ambulatory Visit: Payer: Medicare Other | Admitting: Podiatry

## 2019-08-13 ENCOUNTER — Encounter: Payer: Self-pay | Admitting: Podiatry

## 2019-08-13 ENCOUNTER — Other Ambulatory Visit: Payer: Self-pay

## 2019-08-13 DIAGNOSIS — N179 Acute kidney failure, unspecified: Secondary | ICD-10-CM | POA: Diagnosis not present

## 2019-08-13 DIAGNOSIS — L84 Corns and callosities: Secondary | ICD-10-CM | POA: Diagnosis not present

## 2019-08-13 DIAGNOSIS — B351 Tinea unguium: Secondary | ICD-10-CM | POA: Diagnosis not present

## 2019-08-13 DIAGNOSIS — M79676 Pain in unspecified toe(s): Secondary | ICD-10-CM | POA: Diagnosis not present

## 2019-08-13 NOTE — Progress Notes (Signed)
This patient returns to my office for at risk foot care.  This patient requires this care by a professional since this patient will be at risk due to having kidney injury.  Patient has painful callus both forefeet.  This patient is unable to cut nails herself since the patient cannot reach her nails.These nails are painful walking and wearing shoes.  This patient presents for at risk foot care today.  General Appearance  Alert, conversant and in no acute stress.  Vascular  Dorsalis pedis pulses are palpable  Bilaterally. Posterior tibial pulses are absent  B/L.  Capillary return is within normal limits  bilaterally. Temperature is within normal limits  bilaterally.  Neurologic  Senn-Weinstein monofilament wire test within normal limits  bilaterally. Muscle power within normal limits bilaterally.  Nails Thick disfigured discolored nails with subungual debris  from hallux to fifth toes bilaterally. No evidence of bacterial infection or drainage bilaterally.  Orthopedic  No limitations of motion  feet .  No crepitus or effusions noted.  No bony pathology or digital deformities noted.Hammer toes  B/l.  Skin  normotropic skin with no porokeratosis noted bilaterally.  No signs of infections or ulcers noted.   Porokeratosis sub 2 left and sub 3 right.  Onychomycosis  Pain in right toes  Pain in left toes  Porokeratosis  Forefeet  B/L.  Consent was obtained for treatment procedures.   Mechanical debridement of nails 1-5  bilaterally performed with a nail nipper.  Filed with dremel without incident.  Debridement of porokeratosis B/L with # 15 blade.   Return office visit  3 months                    Told patient to return for periodic foot care and evaluation due to potential at risk complications.   Helane Gunther DPM

## 2019-11-14 ENCOUNTER — Ambulatory Visit: Payer: Medicare Other | Admitting: Podiatry

## 2019-12-12 ENCOUNTER — Ambulatory Visit: Payer: Medicare Other | Admitting: Podiatry

## 2019-12-12 ENCOUNTER — Other Ambulatory Visit: Payer: Self-pay

## 2019-12-12 ENCOUNTER — Ambulatory Visit: Payer: Medicare Other

## 2019-12-12 ENCOUNTER — Encounter: Payer: Self-pay | Admitting: Podiatry

## 2019-12-12 DIAGNOSIS — B351 Tinea unguium: Secondary | ICD-10-CM | POA: Diagnosis not present

## 2019-12-12 DIAGNOSIS — M79676 Pain in unspecified toe(s): Secondary | ICD-10-CM | POA: Diagnosis not present

## 2019-12-12 DIAGNOSIS — M79671 Pain in right foot: Secondary | ICD-10-CM

## 2019-12-12 DIAGNOSIS — L84 Corns and callosities: Secondary | ICD-10-CM

## 2019-12-12 DIAGNOSIS — N179 Acute kidney failure, unspecified: Secondary | ICD-10-CM | POA: Diagnosis not present

## 2019-12-12 NOTE — Progress Notes (Addendum)
This patient returns to my office for at risk foot care.  This patient requires this care by a professional since this patient will be at risk due to having kidney injury.  Patient has painful callus both forefeet.  This patient is unable to cut nails herself since the patient cannot reach her nails.These nails are painful walking and wearing shoes.  This patient presents for at risk foot care today.  General Appearance  Alert, conversant and in no acute stress.  Vascular  Dorsalis pedis pulses are palpable  Bilaterally. Posterior tibial pulses are absent  B/L.  Capillary return is within normal limits  bilaterally. Temperature is within normal limits  bilaterally.  Neurologic  Senn-Weinstein monofilament wire test within normal limits  bilaterally. Muscle power within normal limits bilaterally.  Nails Thick disfigured discolored nails with subungual debris  from hallux to fifth toes bilaterally. No evidence of bacterial infection or drainage bilaterally.  Orthopedic  No limitations of motion  feet .  No crepitus or effusions noted.  No bony pathology or digital deformities noted.Hammer toes  B/l.  Overlapping 3rd toe right foot.  Skin  normotropic skin with no porokeratosis noted bilaterally.  No signs of infections or ulcers noted.   Porokeratosis sub 2 left and sub 3 right asymptomatic.  Onychomycosis  Pain in right toes  Pain in left toes   Consent was obtained for treatment procedures.   Mechanical debridement of nails 1-5  bilaterally performed with a nail nipper.  Filed with dremel without incident.    Return office visit  3 months                    Told patient to return for periodic foot care and evaluation due to potential at risk complications.   Helane Gunther DPM

## 2019-12-31 ENCOUNTER — Observation Stay (HOSPITAL_COMMUNITY)
Admission: EM | Admit: 2019-12-31 | Discharge: 2020-01-01 | Disposition: A | Discharge status: Home / Self Care | Payer: Medicare Other | Attending: Family Medicine | Admitting: Family Medicine

## 2019-12-31 ENCOUNTER — Other Ambulatory Visit: Payer: Self-pay

## 2019-12-31 ENCOUNTER — Encounter (HOSPITAL_COMMUNITY): Payer: Self-pay | Admitting: Internal Medicine

## 2019-12-31 ENCOUNTER — Emergency Department (HOSPITAL_COMMUNITY): Payer: Medicare Other

## 2019-12-31 DIAGNOSIS — I720 Aneurysm of carotid artery: Secondary | ICD-10-CM | POA: Diagnosis not present

## 2019-12-31 DIAGNOSIS — R2689 Other abnormalities of gait and mobility: Secondary | ICD-10-CM | POA: Diagnosis not present

## 2019-12-31 DIAGNOSIS — Z66 Do not resuscitate: Secondary | ICD-10-CM | POA: Insufficient documentation

## 2019-12-31 DIAGNOSIS — N189 Chronic kidney disease, unspecified: Secondary | ICD-10-CM | POA: Diagnosis not present

## 2019-12-31 DIAGNOSIS — Z7982 Long term (current) use of aspirin: Secondary | ICD-10-CM | POA: Insufficient documentation

## 2019-12-31 DIAGNOSIS — W1839XA Other fall on same level, initial encounter: Secondary | ICD-10-CM | POA: Diagnosis not present

## 2019-12-31 DIAGNOSIS — Z20822 Contact with and (suspected) exposure to covid-19: Secondary | ICD-10-CM | POA: Diagnosis not present

## 2019-12-31 DIAGNOSIS — Y9389 Activity, other specified: Secondary | ICD-10-CM | POA: Diagnosis not present

## 2019-12-31 DIAGNOSIS — I129 Hypertensive chronic kidney disease with stage 1 through stage 4 chronic kidney disease, or unspecified chronic kidney disease: Secondary | ICD-10-CM | POA: Insufficient documentation

## 2019-12-31 DIAGNOSIS — Y92128 Other place in nursing home as the place of occurrence of the external cause: Secondary | ICD-10-CM | POA: Diagnosis not present

## 2019-12-31 DIAGNOSIS — E039 Hypothyroidism, unspecified: Secondary | ICD-10-CM | POA: Diagnosis not present

## 2019-12-31 DIAGNOSIS — S0990XA Unspecified injury of head, initial encounter: Secondary | ICD-10-CM | POA: Diagnosis present

## 2019-12-31 DIAGNOSIS — Y92009 Unspecified place in unspecified non-institutional (private) residence as the place of occurrence of the external cause: Secondary | ICD-10-CM

## 2019-12-31 DIAGNOSIS — Z79899 Other long term (current) drug therapy: Secondary | ICD-10-CM | POA: Diagnosis not present

## 2019-12-31 DIAGNOSIS — W19XXXA Unspecified fall, initial encounter: Secondary | ICD-10-CM | POA: Diagnosis not present

## 2019-12-31 DIAGNOSIS — S065X9A Traumatic subdural hemorrhage with loss of consciousness of unspecified duration, initial encounter: Secondary | ICD-10-CM | POA: Diagnosis not present

## 2019-12-31 DIAGNOSIS — I10 Essential (primary) hypertension: Secondary | ICD-10-CM

## 2019-12-31 DIAGNOSIS — S065X0A Traumatic subdural hemorrhage without loss of consciousness, initial encounter: Principal | ICD-10-CM | POA: Insufficient documentation

## 2019-12-31 DIAGNOSIS — I609 Nontraumatic subarachnoid hemorrhage, unspecified: Secondary | ICD-10-CM

## 2019-12-31 DIAGNOSIS — I671 Cerebral aneurysm, nonruptured: Secondary | ICD-10-CM | POA: Diagnosis present

## 2019-12-31 DIAGNOSIS — E222 Syndrome of inappropriate secretion of antidiuretic hormone: Secondary | ICD-10-CM | POA: Insufficient documentation

## 2019-12-31 DIAGNOSIS — S065XAA Traumatic subdural hemorrhage with loss of consciousness status unknown, initial encounter: Secondary | ICD-10-CM

## 2019-12-31 DIAGNOSIS — E871 Hypo-osmolality and hyponatremia: Secondary | ICD-10-CM

## 2019-12-31 LAB — COMPREHENSIVE METABOLIC PANEL
ALT: 16 U/L (ref 0–44)
AST: 34 U/L (ref 15–41)
Albumin: 4.2 g/dL (ref 3.5–5.0)
Alkaline Phosphatase: 61 U/L (ref 38–126)
Anion gap: 11 (ref 5–15)
BUN: 12 mg/dL (ref 8–23)
CO2: 25 mmol/L (ref 22–32)
Calcium: 9.3 mg/dL (ref 8.9–10.3)
Chloride: 89 mmol/L — ABNORMAL LOW (ref 98–111)
Creatinine, Ser: 0.79 mg/dL (ref 0.44–1.00)
GFR calc Af Amer: >60 mL/min (ref >60)
GFR calc non Af Amer: >60 mL/min (ref >60)
Glucose, Bld: 128 mg/dL — ABNORMAL HIGH (ref 70–99)
Potassium: 3.5 mmol/L (ref 3.5–5.1)
Sodium: 125 mmol/L — ABNORMAL LOW (ref 135–145)
Total Bilirubin: 1.2 mg/dL (ref 0.3–1.2)
Total Protein: 7.2 g/dL (ref 6.5–8.1)

## 2019-12-31 LAB — URINALYSIS, ROUTINE W REFLEX MICROSCOPIC
Bilirubin Urine: NEGATIVE
Glucose, UA: NEGATIVE mg/dL
Ketones, ur: NEGATIVE mg/dL
Leukocytes,Ua: NEGATIVE
Nitrite: NEGATIVE
Protein, ur: 100 mg/dL — AB
Specific Gravity, Urine: 1.009 (ref 1.005–1.030)
pH: 8 (ref 5.0–8.0)

## 2019-12-31 LAB — CBC WITH DIFFERENTIAL/PLATELET
Abs Immature Granulocytes: 0.02 10*3/uL (ref 0.00–0.07)
Basophils Absolute: 0 10*3/uL (ref 0.0–0.1)
Basophils Relative: 0 %
Eosinophils Absolute: 0 10*3/uL (ref 0.0–0.5)
Eosinophils Relative: 0 %
HCT: 31.1 % — ABNORMAL LOW (ref 36.0–46.0)
Hemoglobin: 10.1 g/dL — ABNORMAL LOW (ref 12.0–15.0)
Immature Granulocytes: 0 %
Lymphocytes Relative: 9 %
Lymphs Abs: 0.7 10*3/uL (ref 0.7–4.0)
MCH: 23.8 pg — ABNORMAL LOW (ref 26.0–34.0)
MCHC: 32.5 g/dL (ref 30.0–36.0)
MCV: 73.3 fL — ABNORMAL LOW (ref 80.0–100.0)
Monocytes Absolute: 0.5 10*3/uL (ref 0.1–1.0)
Monocytes Relative: 7 %
Neutro Abs: 6.3 10*3/uL (ref 1.7–7.7)
Neutrophils Relative %: 84 %
Platelets: 322 10*3/uL (ref 150–400)
RBC: 4.24 MIL/uL (ref 3.87–5.11)
RDW: 17.2 % — ABNORMAL HIGH (ref 11.5–15.5)
WBC: 7.5 10*3/uL (ref 4.0–10.5)
nRBC: 0 % (ref 0.0–0.2)

## 2019-12-31 LAB — SARS CORONAVIRUS 2 BY RT PCR (HOSPITAL ORDER, PERFORMED IN ~~LOC~~ HOSPITAL LAB): SARS Coronavirus 2: NEGATIVE

## 2019-12-31 LAB — SODIUM, URINE, RANDOM: Sodium, Ur: 95 mmol/L

## 2019-12-31 MED ORDER — STROKE: EARLY STAGES OF RECOVERY BOOK
Freq: Once | Status: AC
  Filled 2019-12-31: qty 1

## 2019-12-31 MED ORDER — SODIUM CHLORIDE 0.9 % IV SOLN: INTRAVENOUS | Status: DC

## 2019-12-31 MED ORDER — ACETAMINOPHEN 650 MG RE SUPP: 650.0000 mg | RECTAL | Status: DC | PRN

## 2019-12-31 MED ORDER — HYDRALAZINE HCL 50 MG PO TABS
50.0000 mg | ORAL_TABLET | Freq: Three times a day (TID) | ORAL | Status: DC
  Administered 2019-12-31 – 2020-01-01 (×2): 50 mg via ORAL
  Filled 2019-12-31 (×2): qty 1

## 2019-12-31 MED ORDER — ACETAMINOPHEN 160 MG/5ML PO SOLN: 650.0000 mg | ORAL | Status: DC | PRN

## 2019-12-31 MED ORDER — CARVEDILOL 3.125 MG PO TABS
3.1250 mg | ORAL_TABLET | Freq: Two times a day (BID) | ORAL | Status: DC
  Administered 2020-01-01: 3.125 mg via ORAL
  Filled 2019-12-31: qty 1

## 2019-12-31 MED ORDER — PANTOPRAZOLE SODIUM 40 MG IV SOLR
40.0000 mg | Freq: Every day | INTRAVENOUS | Status: DC
  Administered 2019-12-31: 40 mg via INTRAVENOUS
  Filled 2019-12-31: qty 40

## 2019-12-31 MED ORDER — ACETAMINOPHEN 325 MG PO TABS: 650.0000 mg | ORAL_TABLET | ORAL | Status: DC | PRN

## 2019-12-31 MED ORDER — LEVOTHYROXINE SODIUM 100 MCG PO TABS
100.0000 ug | ORAL_TABLET | Freq: Every day | ORAL | Status: DC
  Administered 2020-01-01: 100 ug via ORAL
  Filled 2019-12-31: qty 1

## 2019-12-31 MED ORDER — SODIUM CHLORIDE 0.9 % IV SOLN: INTRAVENOUS | Status: AC

## 2019-12-31 MED ORDER — DOCUSATE SODIUM 100 MG PO CAPS: 100.0000 mg | ORAL_CAPSULE | ORAL | Status: DC | PRN

## 2019-12-31 MED ORDER — SENNOSIDES-DOCUSATE SODIUM 8.6-50 MG PO TABS
1.0000 | ORAL_TABLET | Freq: Two times a day (BID) | ORAL | Status: DC
  Administered 2019-12-31: 1 via ORAL
  Filled 2019-12-31: qty 1

## 2019-12-31 NOTE — ED Provider Notes (Addendum)
[facility]-EMERGENCY DEPT Provider Note   CSN: [id number] Arrival date & time: 12/31/19  1456     History Chief Complaint  Patient presents with  . Weakness  . Fall    Bethany Blake is a 86 y.o. female.  86 year old female with history of dementia who presents with altered mental status from a possible UTI.  Patient according to nursing home staff fell yesterday but did not sustain any trauma.  When her daughter saw her today Bethany Blake felt like Bethany Blake was not herself.  Has a history of UTIs and is concerned that Bethany Blake may be having another one.  No reported history of medication changes.  No reported fevers.  Transported by EMS.        Past Medical History:  Diagnosis Date  . Anxiety   . Arthritis   . Chronic kidney disease   . Hypertension   . Hypokalemia   . Hyponatremia   . Hypothyroidism     Patient Active Problem List   Diagnosis Date Noted  . Physical deconditioning 09/02/2014  . Hyponatremia 08/28/2014  . HTN (hypertension) 08/28/2014  . Confusion 08/28/2014  . AKI (acute kidney injury) (HCC) 08/28/2014  . Hypochloremia 08/28/2014    Past Surgical History:  Procedure Laterality Date  . SHOULDER SURGERY    . WRIST SURGERY       OB History   No obstetric history on file.     Family History  Problem Relation Age of Onset  . Colon cancer Neg Hx   . Colon polyps Neg Hx   . Rectal cancer Neg Hx   . Stomach cancer Neg Hx     Social History   Tobacco Use  . Smoking status: Former Games developer  . Smokeless tobacco: Never Used  Substance Use Topics  . Alcohol use: No  . Drug use: No    Home Medications Prior to Admission medications   Medication Sig Start Date End Date Taking? Authorizing Provider  acetaminophen (TYLENOL) 325 MG tablet Take 650 mg by mouth every 4 (four) hours as needed for moderate pain.    [provider]  aspirin 81 MG tablet Take 81 mg by mouth daily.    [provider]  carvedilol (COREG) 3.125 MG  tablet Take 1 tablet (3.125 mg total) by mouth 2 (two) times daily with a meal. 08/31/14   Leroy Sea, MD  Cholecalciferol (VITAMIN D PO) Take 1 tablet by mouth daily.    [provider]  docusate sodium (COLACE) 100 MG capsule Take 100 mg by mouth as needed for mild constipation.     [provider]  furosemide (LASIX) 20 MG tablet Take 20 mg by mouth daily.  09/11/14   Oneal Grout, MD  hydrALAZINE (APRESOLINE) 50 MG tablet Take 1 tablet (50 mg total) by mouth every 8 (eight) hours. Patient taking differently: Take 50 mg by mouth 3 (three) times daily.  08/31/14   Leroy Sea, MD  SYNTHROID 100 MCG tablet Take 100 mcg by mouth daily. 06/04/19   [provider]    Allergies    Ace inhibitors, Aldactone [spironolactone], Angiotensin receptor blockers, and Sulfa antibiotics  Review of Systems   Review of Systems  Unable to perform ROS: Dementia    Physical Exam Updated Vital Signs BP (!) 147/89 (BP Location: Right Arm)   Pulse 91   Temp 98.2 F (36.8 C) (Oral)   Resp 19   Ht 1.575 m (5\' 2" )   Wt 61 kg  SpO2 94%   BMI 24.60 kg/m   Physical Exam Vitals and nursing note reviewed.  Constitutional:      General: Bethany Blake is not in acute distress.    Appearance: Normal appearance. Bethany Blake is well-developed. Bethany Blake is not toxic-appearing.  HENT:     Head: Normocephalic and atraumatic.  Eyes:     General: Lids are normal.     Conjunctiva/sclera: Conjunctivae normal.     Pupils: Pupils are equal, round, and reactive to light.  Neck:     Thyroid: No thyroid mass.     Trachea: No tracheal deviation.  Cardiovascular:     Rate and Rhythm: Normal rate and regular rhythm.     Heart sounds: Normal heart sounds. No murmur heard.  No gallop.   Pulmonary:     Effort: Pulmonary effort is normal. No respiratory distress.     Breath sounds: Normal breath sounds. No stridor. No decreased breath sounds, wheezing, rhonchi or rales.  Abdominal:     General: Bowel  sounds are normal. There is no distension.     Palpations: Abdomen is soft.     Tenderness: There is no abdominal tenderness. There is no rebound.  Musculoskeletal:        General: No tenderness. Normal range of motion.     Cervical back: Normal range of motion and neck supple.  Skin:    General: Skin is warm and dry.     Findings: No abrasion or rash.  Neurological:     General: No focal deficit present.     Mental Status: Bethany Blake is alert. Bethany Blake is disoriented.     GCS: GCS eye subscore is 4. GCS verbal subscore is 5. GCS motor subscore is 6.     Cranial Nerves: No cranial nerve deficit.     Sensory: No sensory deficit.     Comments: Patient moves all 4 extremities  Psychiatric:        Attention and Perception: Bethany Blake is inattentive.        Speech: Speech is delayed.        Behavior: Behavior is withdrawn.     ED Results / Procedures / Treatments   Labs (all labs ordered are listed, but only abnormal results are displayed) Labs Reviewed  URINE CULTURE  CBC WITH DIFFERENTIAL/PLATELET  COMPREHENSIVE METABOLIC PANEL  URINALYSIS, ROUTINE W REFLEX MICROSCOPIC    EKG None  Radiology No results found.  Procedures Procedures (including critical care time)  Medications Ordered in ED Medications  0.9 %  sodium chloride infusion (has no administration in time range)    ED Course  I have reviewed the triage vital signs and the nursing notes.  Pertinent labs & imaging results that were available during my care of the patient were reviewed by me and considered in my medical decision making (see chart for details).    MDM Rules/Calculators/A&P                           Patient's labs and x-rays reviewed and significant for negative C-spine CT, the UA without infection but does show a head CT with subdural hematoma with 6 mm of shift with some subarachnoid blood.  Patient has mild hyponatremia with a serum of 125.  Patient does have a history of hyponatremia in the past.  According  to the daughter, patient is almost at her baseline.  Case discussed with Dr. Wynetta Emery from neurosurgery recommends overnight observation on the hospitalist service.  He recommends  a repeat CT scan in the morning.  Discussion with patient's daughter who is also her medical POA and Bethany Blake believes patient to be a DNR at this time.  Impression(s) / ED Diagnoses Final diagnoses:  None    Rx / DC Orders ED Discharge Orders    None       Lorre Nick, MD 12/31/19 2004    Lorre Nick, MD 12/31/19 2006

## 2019-12-31 NOTE — H&P (Addendum)
History and Physical    Bethany Blake JJO:[id number] DOB: [birth year] DOA: 12/31/2019  PCP: [name], MD  Patient coming from: Home   Chief Complaint:  Chief Complaint  Patient presents with  . Weakness  . Fall     HPI:    86 year old female with past medical history of hypertension, SIADH, hypothyroidism who presents to [facility] long hospital emergency department status post being found down in her bathroom with presumed fall.  History has been obtained from both the patient as well as the daughter who is at the bedside.  According to the daughter, the patient was found down in her bathroom this morning by a friend of the family's who is also the patient's caretaker.  Patient is currently a resident at an independent living facility.  According to the daughter, patient has exhibited increasing forgetfulness over at least the past week.  Otherwise, patient has been mentating at her baseline until this morning.  Patient does not particularly recall any events surrounding the fall.  Patient does deny loss of consciousness.  Patient currently denies any chest pain or lightheadedness.  Patient denies any focal weakness, slurring of speech or changes in vision.  Patient was brought into [facility] emergency department for evaluation.  Upon initial evaluation CT imaging of the head revealed bilateral subdural hematomas with associated 6.9 mm midline shift in addition to a small right subarachnoid hemorrhage.  Patient was also found to be hyponatremic with a sodium of 125.  Once informed, daughter and patient stated that he did not wish to proceed with any surgical intervention.  Case was discussed with Dr. Wynetta Emery with neurosurgery who recommended observation overnight, serial CT imaging of the head in the morning and consideration of discharge tomorrow.  The hospitalist group was then called to assess the patient for mission to the hospital.    Review of Systems:   Review of Systems    Constitutional: Positive for malaise/fatigue.  Neurological: Positive for weakness.  All other systems reviewed and are negative.   Past Medical History:  Diagnosis Date  . Anxiety   . Arthritis   . Chronic kidney disease   . Hypertension   . Hypokalemia   . Hyponatremia   . Hypothyroidism     Past Surgical History:  Procedure Laterality Date  . SHOULDER SURGERY    . WRIST SURGERY       reports that she has quit smoking. She has never used smokeless tobacco. She reports that she does not drink alcohol and does not use drugs.  Allergies  Allergen Reactions  . Ace Inhibitors Other (See Comments)    Severe hyponatremia  . Aldactone [Spironolactone] Other (See Comments)    Severe hyponatremia  . Angiotensin Receptor Blockers Other (See Comments)    Severe hyponatremia  . Sulfa Antibiotics Rash    Family History  Problem Relation Age of Onset  . Colon cancer Neg Hx   . Colon polyps Neg Hx   . Rectal cancer Neg Hx   . Stomach cancer Neg Hx      Prior to Admission medications   Medication Sig Start Date End Date Taking? Authorizing Provider  aspirin 81 MG tablet Take 81 mg by mouth daily.   Yes [provider]  carvedilol (COREG) 3.125 MG tablet Take 1 tablet (3.125 mg total) by mouth 2 (two) times daily with a meal. 08/31/14  Yes Leroy Sea, MD  Cholecalciferol (VITAMIN D PO) Take 1 tablet by mouth daily.   Yes  [provider]  docusate sodium (COLACE) 100 MG capsule Take 100 mg by mouth as needed for mild constipation.    Yes [provider]  furosemide (LASIX) 20 MG tablet Take 20 mg by mouth daily.  09/11/14  Yes Oneal Grout, MD  hydrALAZINE (APRESOLINE) 50 MG tablet Take 1 tablet (50 mg total) by mouth every 8 (eight) hours. Patient taking differently: Take 50 mg by mouth 3 (three) times daily.  08/31/14  Yes Leroy Sea, MD  SYNTHROID 100 MCG tablet Take 100 mcg by mouth daily. 06/04/19  Yes [provider]   traMADol (ULTRAM) 50 MG tablet Take 25 mg by mouth daily as needed for moderate pain.  08/29/19  Yes [provider]  acetaminophen (TYLENOL) 325 MG tablet Take 650 mg by mouth every 4 (four) hours as needed for moderate pain. Patient not taking: Reported on 12/31/2019    [provider]    Physical Exam: Vitals:   12/31/19 1740 12/31/19 1800 12/31/19 1830 12/31/19 1900  BP: (!) 141/70 129/61 (!) 122/55 (!) 120/58  Pulse: 88 80 78 85  Resp: (!) Temp:      TempSrc:      SpO2: 95% 93% 92% 94%  Weight:      Height:        Constitutional: Acute alert and oriented, no associated distress.   Skin: no rashes, no lesions, notable poor skin turgor eyes: Pupils are equally reactive to light.  No evidence of scleral icterus or conjunctival pallor.  ENMT: Slightly dry mucous membranes noted.  Posterior pharynx clear of any exudate or lesions.   Neck: normal, supple, no masses, no thyromegaly.  No evidence of jugular venous distension.   Respiratory: clear to auscultation bilaterally, no wheezing, no crackles. Normal respiratory effort. No accessory muscle use.  Cardiovascular: Regular rate and rhythm, no murmurs / rubs / gallops. No extremity edema. 2+ pedal pulses. No carotid bruits.  Chest:   Nontender without crepitus or deformity.   Back:   Nontender without crepitus or deformity. Abdomen: Abdomen is soft and nontender.  No evidence of intra-abdominal masses.  Positive bowel sounds noted in all quadrants.   Musculoskeletal: No joint deformity upper and lower extremities. Good ROM, no contractures. Normal muscle tone.  Neurologic: CN 2-12 grossly intact. Sensation intact.  Patient exhibiting generalized weakness of all 4 extremities although strength is equal.  Patient is following all commands.  Patient is responsive to verbal stimuli.   Psychiatric: Patient exhibits a flat affect.  Patient seems to possess insight as to their current situation.     Labs on  Admission: I have personally reviewed following labs and imaging studies -   CBC: Recent Labs  Lab 12/31/19 1525  WBC 7.5  NEUTROABS 6.3  HGB 10.1*  HCT 31.1*  MCV 73.3*  PLT 322   Basic Metabolic Panel: Recent Labs  Lab 12/31/19 1525  NA 125*  K 3.5  CL 89*  CO2 25  GLUCOSE 128*  BUN 12  CREATININE 0.79  CALCIUM 9.3   GFR: Estimated Creatinine Clearance: 35.4 mL/min (by C-G formula based on SCr of 0.79 mg/dL). Liver Function Tests: Recent Labs  Lab 12/31/19 1525  AST 34  ALT 16  ALKPHOS 61  BILITOT 1.2  PROT 7.2  ALBUMIN 4.2   No results for input(s): LIPASE, AMYLASE in the last 168 hours. No results for input(s): AMMONIA in the last 168 hours. Coagulation Profile: No results for input(s): INR, PROTIME in  the last 168 hours. Cardiac Enzymes: No results for input(s): CKTOTAL, CKMB, CKMBINDEX, TROPONINI in the last 168 hours. BNP (last 3 results) No results for input(s): PROBNP in the last 8760 hours. HbA1C: No results for input(s): HGBA1C in the last 72 hours. CBG: No results for input(s): GLUCAP in the last 168 hours. Lipid Profile: No results for input(s): CHOL, HDL, LDLCALC, TRIG, CHOLHDL, LDLDIRECT in the last 72 hours. Thyroid Function Tests: No results for input(s): TSH, T4TOTAL, FREET4, T3FREE, THYROIDAB in the last 72 hours. Anemia Panel: No results for input(s): VITAMINB12, FOLATE, FERRITIN, TIBC, IRON, RETICCTPCT in the last 72 hours. Urine analysis:    Component Value Date/Time   COLORURINE YELLOW 12/31/2019 1637   APPEARANCEUR CLEAR 12/31/2019 1637   LABSPEC 1.009 12/31/2019 1637   PHURINE 8.0 12/31/2019 1637   GLUCOSEU NEGATIVE 12/31/2019 1637   HGBUR MODERATE (A) 12/31/2019 1637   BILIRUBINUR NEGATIVE 12/31/2019 1637   KETONESUR NEGATIVE 12/31/2019 1637   PROTEINUR 100 (A) 12/31/2019 1637   UROBILINOGEN 0.2 08/28/2014 0227   NITRITE NEGATIVE 12/31/2019 1637   LEUKOCYTESUR NEGATIVE 12/31/2019 1637    Radiological Exams on  Admission - Personally Reviewed: CT Head Wo Contrast  Result Date: 12/31/2019 CLINICAL DATA:  Fall EXAM: CT HEAD WITHOUT CONTRAST TECHNIQUE: Contiguous axial images were obtained from the base of the skull through the vertex without intravenous contrast. COMPARISON:  CT head 01/07/2019 FINDINGS: Brain: Left frontal subdural hematoma. This extends from the subfrontal region laterally to the convexity. This measures up to 11 mm in thickness. Right parietal subdural hematoma 8.5 mm. Mild interhemispheric subdural hematoma. Small left parietal subdural hematoma. Small amount of subarachnoid hemorrhage in the right sylvian fissure. Ventricle size normal. Mild midline shift to the right 6.9 mm. Negative for acute infarct or mass. Vascular: Negative for hyperdense vessel. Suprasellar soft tissue mass on the left with peripheral calcification likely is an aneurysm of the terminal left internal carotid artery. This measures approximately 11 mm in diameter. Skull: Negative for skull fracture Sinuses/Orbits: Paranasal sinuses clear. Mastoid clear. Bilateral cataract extraction. Other: None IMPRESSION: Bilateral subdural hematomas. Largest hematoma in the left frontal region. 6.9 mm midline shift to the right. Small amount of subarachnoid hemorrhage on the right. Suprasellar mass on the left appears to be associated with internal carotid artery and is most consistent with an aneurysm. This measures up to 11 mm in diameter. These results were called by telephone at the time of interpretation on 12/31/2019 at 6:11 pm to provider Lorre Nick , who verbally acknowledged these results. a Electronically Signed   By: Marlan Palau M.D.   On: 12/31/2019 18:13   CT Cervical Spine Wo Contrast  Result Date: 12/31/2019 CLINICAL DATA:  Larey Seat EXAM: CT CERVICAL SPINE WITHOUT CONTRAST TECHNIQUE: Multidetector CT imaging of the cervical spine was performed without intravenous contrast. Multiplanar CT image reconstructions were also  generated. COMPARISON:  01/07/2019 FINDINGS: Alignment: There is stable reversal of cervical lordosis from C3 through C5, due to prominent facet hypertrophy and spondylosis. Mild anterolisthesis of C2 on C3 and C3 on C4 is stable. Otherwise alignment is anatomic. Skull base and vertebrae: No acute displaced fracture. Soft tissues and spinal canal: No prevertebral fluid or swelling. No visible canal hematoma. Disc levels: Stable multilevel spondylosis most pronounced at C4-5, C5-6, and C6-7. There is diffuse facet hypertrophy, greatest at C2-3 and C3-4. No change since previous exam. Upper chest: Airway is patent.  Lung apices are clear. Other: Reconstructed images demonstrate no additional findings. IMPRESSION: 1. No acute  cervical spine fracture. 2. Stable multilevel spondylosis and facet hypertrophy. Electronically Signed   By: Sharlet Salina M.D.   On: 12/31/2019 19:41   DG Chest Port 1 View  Result Date: 12/31/2019 CLINICAL DATA:  Cough, history of UTI EXAM: PORTABLE CHEST 1 VIEW.  Patient is rotated. COMPARISON:  Chest x-ray 01/07/2019 FINDINGS: The heart size and mediastinal contours are unchanged. Aortic arch and descending thoracic aorta calcification. Similar appearing coarsened interstitial markings. Biapical pleural/pulmonary scarring. No focal consolidation. No pulmonary edema. No pleural effusion. No pneumothorax. No acute displaced fracture. Chronic right shoulder dislocation with sclerotic changes and remodeling of the proximal right humeral shaft/neck. Chronic acromioclavicular joint widening. IMPRESSION: 1. No active cardiopulmonary disease. 2.  Aortic Atherosclerosis (ICD10-I70.0). Electronically Signed   By: Tish Frederickson M.D.   On: 12/31/2019 16:17    Telemetry: Personally reviewed.  Rhythm is normal sinus rhythm.     Assessment/Plan Principal Problem:   Bilateral subdural hematomas (HCC)   Patient was found status post fall, being found down in the bathroom although details  surrounding this fall are unclear.  CT imaging of the head revealed bilateral subdural hematomas with 6.9 mm evidence of midline shift as well as some very small notable subarachnoid hemorrhage on the right.  Per discussion with the patient and daughter who is POA by both the emergency department provider and myself, they both confirmed that he did not wish to proceed with any intervention.  Case is already been discussed with neurosurgery by the emergency department provider who recommends overnight observation with serial CT imaging tomorrow consideration of discharge tomorrow.  While it is not completely clear, the most likely cause of the subdural hematomas is the patient's fall.  Obtaining serial neurologic checks throughout the evening, physical therapy evaluation in the morning.  Performing repeat CT imaging of the head tomorrow morning per neurosurgery recommendations.  Active Problems:   SIADH (syndrome of inappropriate ADH production) (HCC)   Sodium found to be 125 on arrival, a substantial decrease compared to the approximate average sodium of 130 per review of older laboratory values.  It is unclear as to how much this moderate hyponatremia contributed to patient's fall.  Temporarily holding daily Lasix therapy.  Obtaining serum osmolality, urine osmolality, urine sodium.  Gently hydrating patient with intravenous normal saline  Obtaining serial chemistries  Targeting sodium in the 120s to low  130s by discharge.    Subarachnoid hemorrhage (HCC)   Please see assessment and plan above    Fall at home, initial encounter   Circumstances surrounding fall are unclear.  Telemetry reveals normal sinus rhythm  Monitoring on telemetry throughout the evening with serial neurologic checks.  Physical therapy evaluation in the morning.  Moderate hyponatremia may have contributed.    Essential hypertension   Continue home regimen of Coreg, hydralazine.     Hypothyroidism    Continue home regimen of Synthroid  Obtaining TSH  Aneurysm of right internal carotid artery   Conservative management for now, outpatient follow-up   Code Status:  DNR Family Communication: Daughter at the bedside has POA and has been updated on plan of care.  Status is: Observation  The patient remains OBS appropriate and will d/c before 2 midnights.  Dispo: The patient is from: Independent living facility              Anticipated d/c is to: Independent living facility              Anticipated d/c date is: 2 days  Patient currently is not medically stable to d/c.        Marinda Elk MD [facility] Pager [phone]  If 7PM-7AM, please contact night-coverage [email or web address] Use universal [facility] password for that web site. If you do not have the password, please call the hospital operator.  12/31/2019, 9:10 PM

## 2019-12-31 NOTE — ED Triage Notes (Signed)
Patient from [place] post fall no LOC Hx of urinary tract infection

## 2020-01-01 ENCOUNTER — Observation Stay (HOSPITAL_COMMUNITY): Payer: Medicare Other

## 2020-01-01 DIAGNOSIS — S065X9A Traumatic subdural hemorrhage with loss of consciousness of unspecified duration, initial encounter: Secondary | ICD-10-CM | POA: Diagnosis not present

## 2020-01-01 LAB — BASIC METABOLIC PANEL
Anion gap: 10 (ref 5–15)
Anion gap: 10 (ref 5–15)
Anion gap: 11 (ref 5–15)
BUN: 15 mg/dL (ref 8–23)
BUN: 15 mg/dL (ref 8–23)
BUN: 15 mg/dL (ref 8–23)
CO2: 22 mmol/L (ref 22–32)
CO2: 22 mmol/L (ref 22–32)
CO2: 23 mmol/L (ref 22–32)
Calcium: 8.4 mg/dL — ABNORMAL LOW (ref 8.9–10.3)
Calcium: 8.5 mg/dL — ABNORMAL LOW (ref 8.9–10.3)
Calcium: 8.7 mg/dL — ABNORMAL LOW (ref 8.9–10.3)
Chloride: 94 mmol/L — ABNORMAL LOW (ref 98–111)
Chloride: 95 mmol/L — ABNORMAL LOW (ref 98–111)
Chloride: 96 mmol/L — ABNORMAL LOW (ref 98–111)
Creatinine, Ser: 0.73 mg/dL (ref 0.44–1.00)
Creatinine, Ser: 0.83 mg/dL (ref 0.44–1.00)
Creatinine, Ser: 0.85 mg/dL (ref 0.44–1.00)
GFR calc Af Amer: >60 mL/min (ref >60)
GFR calc Af Amer: >60 mL/min (ref >60)
GFR calc Af Amer: >60 mL/min (ref >60)
GFR calc non Af Amer: 58 mL/min — ABNORMAL LOW (ref >60)
GFR calc non Af Amer: 60 mL/min — ABNORMAL LOW (ref >60)
GFR calc non Af Amer: >60 mL/min (ref >60)
Glucose, Bld: 104 mg/dL — ABNORMAL HIGH (ref 70–99)
Glucose, Bld: 99 mg/dL (ref 70–99)
Glucose, Bld: 99 mg/dL (ref 70–99)
Potassium: 3.2 mmol/L — ABNORMAL LOW (ref 3.5–5.1)
Potassium: 3.5 mmol/L (ref 3.5–5.1)
Potassium: 3.6 mmol/L (ref 3.5–5.1)
Sodium: 126 mmol/L — ABNORMAL LOW (ref 135–145)
Sodium: 128 mmol/L — ABNORMAL LOW (ref 135–145)
Sodium: 129 mmol/L — ABNORMAL LOW (ref 135–145)

## 2020-01-01 LAB — PROTIME-INR
INR: 1 (ref 0.8–1.2)
Prothrombin Time: 12.5 seconds (ref 11.4–15.2)

## 2020-01-01 LAB — CBC WITH DIFFERENTIAL/PLATELET
Abs Immature Granulocytes: 0.02 10*3/uL (ref 0.00–0.07)
Basophils Absolute: 0 10*3/uL (ref 0.0–0.1)
Basophils Relative: 0 %
Eosinophils Absolute: 0.2 10*3/uL (ref 0.0–0.5)
Eosinophils Relative: 3 %
HCT: 26.5 % — ABNORMAL LOW (ref 36.0–46.0)
Hemoglobin: 8.8 g/dL — ABNORMAL LOW (ref 12.0–15.0)
Immature Granulocytes: 0 %
Lymphocytes Relative: 16 %
Lymphs Abs: 1 10*3/uL (ref 0.7–4.0)
MCH: 24.2 pg — ABNORMAL LOW (ref 26.0–34.0)
MCHC: 33.2 g/dL (ref 30.0–36.0)
MCV: 72.8 fL — ABNORMAL LOW (ref 80.0–100.0)
Monocytes Absolute: 0.7 10*3/uL (ref 0.1–1.0)
Monocytes Relative: 12 %
Neutro Abs: 4.2 10*3/uL (ref 1.7–7.7)
Neutrophils Relative %: 69 %
Platelets: 241 10*3/uL (ref 150–400)
RBC: 3.64 MIL/uL — ABNORMAL LOW (ref 3.87–5.11)
RDW: 17.4 % — ABNORMAL HIGH (ref 11.5–15.5)
WBC: 6.2 10*3/uL (ref 4.0–10.5)
nRBC: 0 % (ref 0.0–0.2)

## 2020-01-01 LAB — APTT: aPTT: 28 seconds (ref 24–36)

## 2020-01-01 LAB — OSMOLALITY: Osmolality: 263 mOsm/kg — ABNORMAL LOW (ref 275–295)

## 2020-01-01 LAB — URINE CULTURE: Culture: NO GROWTH

## 2020-01-01 LAB — OSMOLALITY, URINE: Osmolality, Ur: 344 mOsm/kg (ref 300–900)

## 2020-01-01 LAB — TSH: TSH: 3.013 u[IU]/mL (ref 0.350–4.500)

## 2020-01-01 NOTE — Evaluation (Signed)
Speech Language Pathology Evaluation Patient Details Name: Bethany Blake MRN: [id number] DOB: [birth year] Today's Date: 01/01/2020 Time: 0630-1601 SLP Time Calculation (min) (ACUTE ONLY): 20 min  Problem List:  Patient Active Problem List   Diagnosis Date Noted   Bilateral subdural hematomas (HCC) 12/31/2019   Subarachnoid hemorrhage (HCC) 12/31/2019   Hypothyroidism 12/31/2019   Fall at home, initial encounter 12/31/2019   Aneurysm of right internal carotid artery 12/31/2019   Physical deconditioning 09/02/2014   SIADH (syndrome of inappropriate ADH production) (HCC) 08/28/2014   Essential hypertension 08/28/2014   Confusion 08/28/2014   AKI (acute kidney injury) (HCC) 08/28/2014   Hypochloremia 08/28/2014   Past Medical History:  Past Medical History:  Diagnosis Date   Anxiety    Arthritis    Chronic kidney disease    Hypertension    Hypokalemia    Hyponatremia    Hypothyroidism    Past Surgical History:  Past Surgical History:  Procedure Laterality Date   SHOULDER SURGERY     WRIST SURGERY     HPI:  86yo female admitted from home ([facility]) 12/31/19 with weakness and fall. PMH: HTN, SIADH, hypothyroid. Dtr reports increased forgetfulness over 1+ week. CTHead = B SDH with midline shift, small SAH.   Assessment / Plan / Recommendation Clinical Impression  The Mini-Mental State Examination (MMSE) was administered. Pt scored 22/27 (items requiring vision not administered due to advanced macular degeneration), raising concern for neurocognitive impairment of orientation, memory, and attention with anticipated impairment of higher levels of cognition (reasoning, problem solving, executive functions). Daughter reports pt has demonstrated increased confusion and variable mentation recently. She indicates she manages pt's finances for the most part, and has been filling her med box. Pt has been independent with taking meds, but has had increased confusion with this  task recently. Deficits at this time appear to be mild, however, given report of variable levels of mentation, increased 24 hour supervision is recommended at DC to maximize safety. Home health ST may be beneficial once pt returns to her normal routines, for establishment of routines and education re: compensatory strategies to maximize safety and independence and decrease caregiver burden. Acute ST services not recommended at this time. Daughter verbalized understanding.    SLP Assessment  SLP Recommendation/Assessment: All further Speech Language Pathology needs can be addressed in the next venue of care  SLP Visit Diagnosis: Cognitive communication deficit (R41.841)    Follow Up Recommendations  24 hour supervision/assistance       SLP Evaluation Cognition  Overall Cognitive Status: History of cognitive impairments - at baseline Arousal/Alertness: Awake/alert Orientation Level: Oriented to person;Oriented to place;Disoriented to situation;Oriented to time       Comprehension  Auditory Comprehension Overall Auditory Comprehension: Appears within functional limits for tasks assessed    Expression Expression Primary Mode of Expression: Verbal Verbal Expression Overall Verbal Expression: Appears within functional limits for tasks assessed Written Expression Dominant Hand: Right   Oral / Motor  Oral Motor/Sensory Function Overall Oral Motor/Sensory Function: Within functional limits Motor Speech Overall Motor Speech: Appears within functional limits for tasks assessed   GO                   Willodene Stallings B. Murvin Natal, [facility], CCC-SLP Speech Language Pathologist Office: [phone]  Leigh Aurora 01/01/2020, 12:56 PM

## 2020-01-01 NOTE — Plan of Care (Signed)

## 2020-01-01 NOTE — Discharge Summary (Signed)
Physician Discharge Summary  Bethany Blake TKP:[id number] DOB: 05/01/1921 DOA: 12/31/2019  PCP: [name], MD  Admit date: 12/31/2019 Discharge date: 01/01/2020  Time spent: 40 minutes  Recommendations for Outpatient Follow-up:  1. Recommend discontinuation of vitamin D, aspirin 2. Recommend careful use of Lasix as may be related to falls 3.  in addition-consider discontinuation some of her blood pressure meds like hydralazine and Coreg on follow-up with primary care  Discharge Diagnoses:  Principal Problem:   Bilateral subdural hematomas (HCC) Active Problems:   SIADH (syndrome of inappropriate ADH production) (HCC)   Essential hypertension   Subarachnoid hemorrhage (HCC)   Hypothyroidism   Fall at home, initial encounter   Aneurysm of right internal carotid artery   Discharge Condition: Improved  Diet recommendation: Heart healthy  Filed Weights   12/31/19 1509  Weight: 61 kg    History of present illness:  86 year old white fem resident of independent living facility   history hypertension hypothyroid SIADH prior fall with head concussion and lacerations 2012 with repeat admission 2016 for mechanical falls-at that time found to have very low sodium Returns to [place] long ED after being found down in her bathroom 12/31/2018 1 AM Patient did not recall events surrounding fall denied LOC CT head revealed bilateral subdural hematomas 6.9 million 9 chipped right subarachnoid hyponatremia also found sodium 125   Assessment & Plan:   Principal Problem:   Bilateral subdural hematomas (HCC) Active Problems:   SIADH (syndrome of inappropriate ADH production) (HCC)   Essential hypertension   Subarachnoid hemorrhage (HCC)   Hypothyroidism   Fall at home, initial encounter   Aneurysm of right internal carotid artery   1. Bilateral subdural hematomas 2. SIADH baseline 130 3. Mechanical fall possibly secondary to SIADH 4. HTN 5. Hypothyroid 6. Aneurysm right internal  carotid artery  Hospital Course:  Repeat scan was performed on 9/24 that did not show any further bleeding or any extension of the initial bleed and patient was close to mental baseline felt to be optimized for discharge home Patient was hospitalized and was evaluated by therapy services they felt that she would do fairly well with home health She had to be in and out catheter overnight on the night of her stay however was retaining only 65 cc subsequently and did not need further in and out cath Her SIADH resolved with sodium reaching 129 on discharge which is in keeping with her baseline close to 130 She was on Lasix, vitamin D and aspirin all of which have been held on discharge-she did not appear volume overloaded-Lasix should be sparingly used given propensity for volume depletion and I would recommend discontinuation aspirin and vitamin D given advanced age and lack of mortality benefit in addition to high risk of bleeding if she remains on aspirin     Consultations:  Neurosurgeon Dr. Wynetta Emery was consulted and made recommendations  Discharge Exam: Vitals:   01/01/20 0540 01/01/20 0928  BP: (!) 120/55 (!) 127/51  Pulse: 80 78  Resp: 20 19  Temp: 97.9 F (36.6 C) (!) 97.2 F (36.2 C)  SpO2: 95% 95%    General: Awake coherent in some distress some pain to the back of her head and otherwise is well was able to get up out of bed with therapy services Cardiovascular: S1-S2 no murmur rub or gallop Respiratory: Clear no added sound no rales no rhonchi Abdomen soft no rebound  Discharge Instructions   Discharge Instructions    Diet - low sodium heart healthy  Complete by: As directed    Discharge instructions   Complete by: As directed    Recommend stopping aspirin because of risk of bleeding given subdural hemorrhages Have stopped Tramadol as well as Lasix--- may be able to resume Lasix on an as-needed basis as per primary care physician Do not need vitamin D We will order  home health, to come out and assess you and help you with mobility   Increase activity slowly   Complete by: As directed      Allergies as of 01/01/2020      Reactions   Ace Inhibitors Other (See Comments)   Severe hyponatremia   Aldactone [spironolactone] Other (See Comments)   Severe hyponatremia   Angiotensin Receptor Blockers Other (See Comments)   Severe hyponatremia   Sulfa Antibiotics Rash      Medication List    STOP taking these medications   aspirin 81 MG tablet   furosemide 20 MG tablet Commonly known as: LASIX   traMADol 50 MG tablet Commonly known as: ULTRAM   VITAMIN D PO     TAKE these medications   acetaminophen 325 MG tablet Commonly known as: TYLENOL Take 650 mg by mouth every 4 (four) hours as needed for moderate pain.   carvedilol 3.125 MG tablet Commonly known as: COREG Take 1 tablet (3.125 mg total) by mouth 2 (two) times daily with a meal.   docusate sodium 100 MG capsule Commonly known as: COLACE Take 100 mg by mouth as needed for mild constipation.   hydrALAZINE 50 MG tablet Commonly known as: APRESOLINE Take 1 tablet (50 mg total) by mouth every 8 (eight) hours. What changed: when to take this   Synthroid 100 MCG tablet Generic drug: levothyroxine Take 100 mcg by mouth daily.      Allergies  Allergen Reactions  . Ace Inhibitors Other (See Comments)    Severe hyponatremia  . Aldactone [Spironolactone] Other (See Comments)    Severe hyponatremia  . Angiotensin Receptor Blockers Other (See Comments)    Severe hyponatremia  . Sulfa Antibiotics Rash      The results of significant diagnostics from this hospitalization (including imaging, microbiology, ancillary and laboratory) are listed below for reference.    Significant Diagnostic Studies: CT HEAD WO CONTRAST  Result Date: 01/01/2020 CLINICAL DATA:  Follow-up hemorrhage EXAM: CT HEAD WITHOUT CONTRAST TECHNIQUE: Contiguous axial images were obtained from the base of the  skull through the vertex without intravenous contrast. COMPARISON:  12/31/2019 FINDINGS: Brain: Left side subdural hematoma is again noted beginning in the subfrontal region extending over the convexity. This measures 10 mm in thickness over the left frontal lobe and unchanged in overall size and appearance. 6 mm of left-to-right midline shift. Right side subdural hematoma again noted overlying the right parietal lobe measuring up to 7 mm in thickness. Small amount of subarachnoid hemorrhage seen in the right sylvian fissure. No hydrocephalus. No new areas of hemorrhage. Vascular: No hyperdense vessel or unexpected calcification. Skull: No acute calvarial abnormality. Sinuses/Orbits: Visualized paranasal sinuses and mastoids clear. Orbital soft tissues unremarkable. Other: None IMPRESSION: Stable bilateral subdural hematomas and small subarachnoid hemorrhage in the right sylvian fissure. 6 mm of left-to-right midline shift. No new areas of hemorrhage. Electronically Signed   By: Charlett Nose M.D.   On: 01/01/2020 09:30   CT Head Wo Contrast  Result Date: 12/31/2019 CLINICAL DATA:  Fall EXAM: CT HEAD WITHOUT CONTRAST TECHNIQUE: Contiguous axial images were obtained from the base of the skull through the vertex  without intravenous contrast. COMPARISON:  CT head 01/07/2019 FINDINGS: Brain: Left frontal subdural hematoma. This extends from the subfrontal region laterally to the convexity. This measures up to 11 mm in thickness. Right parietal subdural hematoma 8.5 mm. Mild interhemispheric subdural hematoma. Small left parietal subdural hematoma. Small amount of subarachnoid hemorrhage in the right sylvian fissure. Ventricle size normal. Mild midline shift to the right 6.9 mm. Negative for acute infarct or mass. Vascular: Negative for hyperdense vessel. Suprasellar soft tissue mass on the left with peripheral calcification likely is an aneurysm of the terminal left internal carotid artery. This measures  approximately 11 mm in diameter. Skull: Negative for skull fracture Sinuses/Orbits: Paranasal sinuses clear. Mastoid clear. Bilateral cataract extraction. Other: None IMPRESSION: Bilateral subdural hematomas. Largest hematoma in the left frontal region. 6.9 mm midline shift to the right. Small amount of subarachnoid hemorrhage on the right. Suprasellar mass on the left appears to be associated with internal carotid artery and is most consistent with an aneurysm. This measures up to 11 mm in diameter. These results were called by telephone at the time of interpretation on 12/31/2019 at 6:11 pm to provider Lorre Nick , who verbally acknowledged these results. a Electronically Signed   By: Marlan Palau M.D.   On: 12/31/2019 18:13   CT Cervical Spine Wo Contrast  Result Date: 12/31/2019 CLINICAL DATA:  Larey Seat EXAM: CT CERVICAL SPINE WITHOUT CONTRAST TECHNIQUE: Multidetector CT imaging of the cervical spine was performed without intravenous contrast. Multiplanar CT image reconstructions were also generated. COMPARISON:  01/07/2019 FINDINGS: Alignment: There is stable reversal of cervical lordosis from C3 through C5, due to prominent facet hypertrophy and spondylosis. Mild anterolisthesis of C2 on C3 and C3 on C4 is stable. Otherwise alignment is anatomic. Skull base and vertebrae: No acute displaced fracture. Soft tissues and spinal canal: No prevertebral fluid or swelling. No visible canal hematoma. Disc levels: Stable multilevel spondylosis most pronounced at C4-5, C5-6, and C6-7. There is diffuse facet hypertrophy, greatest at C2-3 and C3-4. No change since previous exam. Upper chest: Airway is patent.  Lung apices are clear. Other: Reconstructed images demonstrate no additional findings. IMPRESSION: 1. No acute cervical spine fracture. 2. Stable multilevel spondylosis and facet hypertrophy. Electronically Signed   By: Sharlet Salina M.D.   On: 12/31/2019 19:41   DG Chest Port 1 View  Result Date:  12/31/2019 CLINICAL DATA:  Cough, history of UTI EXAM: PORTABLE CHEST 1 VIEW.  Patient is rotated. COMPARISON:  Chest x-ray 01/07/2019 FINDINGS: The heart size and mediastinal contours are unchanged. Aortic arch and descending thoracic aorta calcification. Similar appearing coarsened interstitial markings. Biapical pleural/pulmonary scarring. No focal consolidation. No pulmonary edema. No pleural effusion. No pneumothorax. No acute displaced fracture. Chronic right shoulder dislocation with sclerotic changes and remodeling of the proximal right humeral shaft/neck. Chronic acromioclavicular joint widening. IMPRESSION: 1. No active cardiopulmonary disease. 2.  Aortic Atherosclerosis (ICD10-I70.0). Electronically Signed   By: Tish Frederickson M.D.   On: 12/31/2019 16:17    Microbiology: Recent Results (from the past 240 hour(s))  SARS Coronavirus 2 by RT PCR (hospital order, performed in [facility] hospital lab) Nasopharyngeal Nasopharyngeal Swab     Status: None   Collection Time: 12/31/19  6:05 PM   Specimen: Nasopharyngeal Swab  Result Value Ref Range Status   SARS Coronavirus 2 NEGATIVE NEGATIVE Final    Comment: (NOTE) SARS-CoV-2 target nucleic acids are NOT DETECTED.  The SARS-CoV-2 RNA is generally detectable in upper and lower respiratory specimens during the acute phase  of infection. The lowest concentration of SARS-CoV-2 viral copies this assay can detect is 250 copies / mL. A negative result does not preclude SARS-CoV-2 infection and should not be used as the sole basis for treatment or other patient management decisions.  A negative result may occur with improper specimen collection / handling, submission of specimen other than nasopharyngeal swab, presence of viral mutation(s) within the areas targeted by this assay, and inadequate number of viral copies (<250 copies / mL). A negative result must be combined with clinical observations, patient history, and epidemiological  information.  Fact Sheet for Patients:   BoilerBrush.com.cy  Fact Sheet for Healthcare Providers: https://pope.com/  This test is not yet approved or  cleared by the [place] FDA and has been authorized for detection and/or diagnosis of SARS-CoV-2 by FDA under an Emergency Use Authorization (EUA).  This EUA will remain in effect (meaning this test can be used) for the duration of the COVID-19 declaration under Section 564(b)(1) of the Act, 21 U.S.C. section 360bbb-3(b)(1), unless the authorization is terminated or revoked sooner.  Performed at [facility], [address], [place], [place] [address]      Labs: Basic Metabolic Panel: Recent Labs  Lab 12/31/19 1525 12/31/19 2347 01/01/20 0332 01/01/20 0937  NA 125* 126* 129* 128*  K 3.5 3.5 3.6 3.2*  CL 89* 94* 95* 96*  CO2 GLUCOSE 128* 104* 99 99  BUN CREATININE 0.79 0.83 0.85 0.73  CALCIUM 9.3 8.7* 8.5* 8.4*   Liver Function Tests: Recent Labs  Lab 12/31/19 1525  AST 34  ALT 16  ALKPHOS 61  BILITOT 1.2  PROT 7.2  ALBUMIN 4.2   No results for input(s): LIPASE, AMYLASE in the last 168 hours. No results for input(s): AMMONIA in the last 168 hours. CBC: Recent Labs  Lab 12/31/19 1525 01/01/20 0332  WBC 7.5 6.2  NEUTROABS 6.3 4.2  HGB 10.1* 8.8*  HCT 31.1* 26.5*  MCV 73.3* 72.8*  PLT 322 241   Cardiac Enzymes: No results for input(s): CKTOTAL, CKMB, CKMBINDEX, TROPONINI in the last 168 hours. BNP: BNP (last 3 results) No results for input(s): BNP in the last 8760 hours.  ProBNP (last 3 results) No results for input(s): PROBNP in the last 8760 hours.  CBG: No results for input(s): GLUCAP in the last 168 hours.     Signed:  Rhetta Mura MD   [facility] 01/01/2020, 12:44 PM

## 2020-01-01 NOTE — Evaluation (Signed)
Occupational Therapy Evaluation Patient Details Name: Bethany Blake MRN: [id number] DOB: 1921-06-20 Today's Date: 01/01/2020    History of Present Illness 86 year old female with past medical history of hypertension, SIADH, hypothyroidism who presents to [facility] long hospital emergency department status post being found down in her bathroom with presumed fall. Most recent CT head: Stable bilateral subdural hematomas and small subarachnoidhemorrhage in the right sylvian fissure. 6 mm of left-to-rightmidline shift.   Clinical Impression   Bethany Blake is a 86 year old woman with above medical history who presents with generalized weakness, decreased activity tolerance, impaired balance and complaints of pain resulting in decreased ability to perform functional mobility and ADLs. Patient will benefit from skilled OT services to improve deficits and learn compensatory strategies as needed in order to return home with improved functional abilities to reduce caregiver burden. Family and patient's goal is to return home at discharge.    Follow Up Recommendations  Home health OT    Equipment Recommendations  3 in 1 bedside commode    Recommendations for Other Services       Precautions / Restrictions Precautions Precautions: Fall Restrictions Weight Bearing Restrictions: No      Mobility Bed Mobility Overal bed mobility: Needs Assistance Bed Mobility: Supine to Sit     Supine to sit: Max assist Sit to supine: Mod assist   General bed mobility comments: Max assist to guide LEs to side of bed, trunk lift off and support of head as patient reports her head feels heavy.  Transfers Overall transfer level: Needs assistance Equipment used: Rolling walker (2 wheeled) Transfers: Sit to/from Stand Sit to Stand: Min assist         General transfer comment: stand pivot to recliner with small shuffling steps.    Balance Overall balance assessment: Needs assistance Sitting-balance  support: No upper extremity supported;Feet supported Sitting balance-Leahy Scale: Fair     Standing balance support: Bilateral upper extremity supported Standing balance-Leahy Scale: Poor Standing balance comment: reliant on UE support                           ADL either performed or assessed with clinical judgement   ADL Overall ADL's : Needs assistance/impaired Eating/Feeding: Set up;Sitting   Grooming: Maximal assistance Grooming Details (indicate cue type and reason): Decreased shoulder ROM Upper Body Bathing: Maximal assistance;Sitting;Set up   Lower Body Bathing: Total assistance;Sit to/from stand   Upper Body Dressing : Moderate assistance;Sitting   Lower Body Dressing: Total assistance;Sit to/from stand   Toilet Transfer: Minimal assistance;Stand-pivot;BSC   Toileting- Clothing Manipulation and Hygiene: Total assistance;Sit to/from stand       Functional mobility during ADLs: Minimal assistance;Rolling walker       Vision Baseline Vision/History: Macular Degeneration Patient Visual Report: No change from baseline       Perception     Praxis      Pertinent Vitals/Pain Pain Assessment: Faces Faces Pain Scale: Hurts a little bit Pain Location: Posterior head Pain Descriptors / Indicators: Grimacing;Sore Pain Intervention(s): Monitored during session     Hand Dominance Right   Extremity/Trunk Assessment Upper Extremity Assessment Upper Extremity Assessment: RUE deficits/detail;LUE deficits/detail RUE Deficits / Details: 2/5 shoulder ROM (reports hx of shoulder replacement), grossly functional ROM of elbow, wrist and fingers. Arthritis changes in hand. RUE Sensation: WNL RUE Coordination: WNL LUE Deficits / Details: 3-/5 shoulder ROM, grossly functional ROM of elbow, wrist and fingers. Arthritis changes in hand. Reports numbness in  left hand that is new. LUE Coordination: WNL   Lower Extremity Assessment Lower Extremity Assessment: Defer  to PT evaluation   Cervical / Trunk Assessment Cervical / Trunk Assessment: Kyphotic   Communication Communication Communication: HOH   Cognition Arousal/Alertness: Awake/alert Behavior During Therapy: Flat affect Overall Cognitive Status: History of cognitive impairments - at baseline Area of Impairment: Safety/judgement                         Safety/Judgement: Decreased awareness of safety;Decreased awareness of deficits     General Comments: poor insight - wants to go home however unable to tolerate ambulation   General Comments       Exercises     Shoulder Instructions      Home Living Family/patient expects to be discharged to:: Private residence Living Arrangements: Alone Available Help at Discharge: Family Type of Home: Independent living facility       Home Layout: One level               Home Equipment: Environmental consultant - 4 wheels      Lives With: Alone    Prior Functioning/Environment Level of Independence: Independent with assistive device(s)  Gait / Transfers Assistance Needed: typically ambulates with rollator to dining hall 3x/day     Comments: Independent with ADLs and ambulates to dining room.  Patient's daughter assists her with showering.        OT Problem List: Decreased strength;Decreased range of motion;Decreased activity tolerance;Impaired balance (sitting and/or standing);Impaired vision/perception;Decreased knowledge of use of DME or AE;Pain      OT Treatment/Interventions: Self-care/ADL training;Therapeutic exercise;Neuromuscular education;DME and/or AE instruction;Therapeutic activities;Patient/family education;Balance training    OT Goals(Current goals can be found in the care plan section) Acute Rehab OT Goals Patient Stated Goal: to go home and rest OT Goal Formulation: With patient Time For Goal Achievement: 01/09/20 Potential to Achieve Goals: Good  OT Frequency: Min 2X/week   Barriers to D/C:             Co-evaluation              AM-PAC OT "6 Clicks" Daily Activity     Outcome Measure Help from another person eating meals?: A Little Help from another person taking care of personal grooming?: A Lot Help from another person toileting, which includes using toliet, bedpan, or urinal?: Total Help from another person bathing (including washing, rinsing, drying)?: A Lot Help from another person to put on and taking off regular upper body clothing?: A Lot Help from another person to put on and taking off regular lower body clothing?: Total 6 Click Score: 11   End of Session Equipment Utilized During Treatment: Gait belt;Rolling walker Nurse Communication: Mobility status  Activity Tolerance: Patient limited by fatigue Patient left: in chair;with call bell/phone within reach;with chair alarm set;with family/visitor present  OT Visit Diagnosis: History of falling (Z91.81);Muscle weakness (generalized) (M62.81);Pain Pain - part of body:  (head)                Time: 3557-3220 OT Time Calculation (min): 33 min Charges:  OT General Charges $OT Visit: 1 Visit OT Evaluation $OT Eval Moderate Complexity: 1 Mod  Sumeya Yontz, OTR/L Acute Care Rehab Services  Office [phone] Pager: [phone]   Kelli Churn 01/01/2020, 1:01 PM

## 2020-01-01 NOTE — Evaluation (Signed)
Physical Therapy Evaluation Patient Details Name: Bethany Blake MRN: [id number] DOB: 1921-10-09 Today's Date: 01/01/2020   History of Present Illness  86 year old female with past medical history of hypertension, SIADH, hypothyroidism who presents to [facility] long hospital emergency department status post being found down in her bathroom with presumed fall. Most recent CT head: Stable bilateral subdural hematomas and small subarachnoidhemorrhage in the right sylvian fissure. 6 mm of left-to-rightmidline shift.  Clinical Impression  Pt admitted with above diagnosis.  Pt currently with functional limitations due to the deficits listed below (see PT Problem List). Pt will benefit from skilled PT to increase their independence and safety with mobility to allow discharge to the venue listed below.  Pt unable to tolerate ambulating and reports right medial thigh pain limiting her ability.  Pt from ILF and would benefit from SNF upon d/c.  If back to facility, pt would need increased care and assist for mobility.  Pt also reported dizziness with return to supine, which lasted briefly.  Pt not able to keep eyes open however possibly observed some nystagmus.  Will continue to assess dizziness as pt did fall and hit her head prior to admission and daughter reports another fall one year ago where pt hit posterior head.       Follow Up Recommendations SNF;Supervision/Assistance - 24 hour    Equipment Recommendations  None recommended by PT    Recommendations for Other Services       Precautions / Restrictions Precautions Precautions: Fall      Mobility  Bed Mobility Overal bed mobility: Needs Assistance Bed Mobility: Sit to Supine       Sit to supine: Mod assist   General bed mobility comments: assist for LEs onto bed  Transfers Overall transfer level: Needs assistance Equipment used: 4-wheeled walker Transfers: Sit to/from Stand Sit to Stand: Min assist         General transfer comment:  pt pulls up on her rollator at baseline (very unsafe) - grabs across seat to pull self upright, assist to rise and control descent  Ambulation/Gait Ambulation/Gait assistance: Min assist Gait Distance (Feet): 8 Feet Assistive device: 4-wheeled walker Gait Pattern/deviations: Step-through pattern;Decreased stride length;Antalgic;Decreased stance time - right     General Gait Details: pt reports she doesn't feel like walking so only performed short distance, pt reports pain in right medial thigh is limiting her ability  Stairs            Wheelchair Mobility    Modified Rankin (Stroke Patients Only) Modified Rankin (Stroke Patients Only) Pre-Morbid Rankin Score: Moderate disability Modified Rankin: Moderately severe disability     Balance Overall balance assessment: History of Falls;Needs assistance         Standing balance support: Bilateral upper extremity supported Standing balance-Leahy Scale: Poor Standing balance comment: reliant on UE support                             Pertinent Vitals/Pain Pain Assessment: Faces Faces Pain Scale: Hurts even more Pain Location: Right medial thigh pain Pain Descriptors / Indicators: Grimacing;Sore Pain Intervention(s): Monitored during session;Repositioned    Home Living Family/patient expects to be discharged to:: Private residence Living Arrangements: Alone   Type of Home: Independent living facility         Home Equipment: Walker - 4 wheels      Prior Function Level of Independence: Independent with assistive device(s)   Gait / Transfers Assistance Needed: typically  ambulates with rollator to dining hall 3x/day           Hand Dominance        Extremity/Trunk Assessment        Lower Extremity Assessment Lower Extremity Assessment: Generalized weakness    Cervical / Trunk Assessment Cervical / Trunk Assessment: Kyphotic (scoliosis observed)  Communication   Communication: HOH   Cognition Arousal/Alertness: Awake/alert Behavior During Therapy: Flat affect   Area of Impairment: Safety/judgement                         Safety/Judgement: Decreased awareness of safety;Decreased awareness of deficits     General Comments: poor insight - wants to go home however unable to tolerate ambulation      General Comments      Exercises     Assessment/Plan    PT Assessment Patient needs continued PT services  PT Problem List Decreased mobility;Decreased strength;Decreased activity tolerance;Decreased balance;Decreased knowledge of use of DME;Decreased safety awareness       PT Treatment Interventions DME instruction;Therapeutic exercise;Gait training;Balance training;Functional mobility training;Therapeutic activities;Patient/family education;Wheelchair mobility training;Neuromuscular re-education    PT Goals (Current goals can be found in the Care Plan section)  Acute Rehab PT Goals PT Goal Formulation: With patient/family Time For Goal Achievement: 01/15/20 Potential to Achieve Goals: Good    Frequency Min 2X/week   Barriers to discharge        Co-evaluation               AM-PAC PT "6 Clicks" Mobility  Outcome Measure Help needed turning from your back to your side while in a flat bed without using bedrails?: A Little Help needed moving from lying on your back to sitting on the side of a flat bed without using bedrails?: A Lot Help needed moving to and from a bed to a chair (including a wheelchair)?: A Little Help needed standing up from a chair using your arms (e.g., wheelchair or bedside chair)?: A Little Help needed to walk in hospital room?: A Little Help needed climbing 3-5 steps with a railing? : Total 6 Click Score: 15    End of Session Equipment Utilized During Treatment: Gait belt Activity Tolerance: Patient limited by fatigue Patient left: in bed;with call bell/phone within reach;with bed alarm set;with nursing/sitter in  room Nurse Communication: Mobility status PT Visit Diagnosis: Other abnormalities of gait and mobility (R26.89);Unsteadiness on feet (R26.81)    Time: 8657-8469 PT Time Calculation (min) (ACUTE ONLY): 18 min  Thomasene Mohair PT, DPT Acute Rehabilitation Services Pager: [phone] Office: [phone]  Sarajane Jews 01/01/2020, 12:42 PM

## 2020-01-01 NOTE — Progress Notes (Signed)
New orders received and carried out for in/out cath. 500 ml removed form bladder. PVR 0 ml.

## 2020-01-01 NOTE — Progress Notes (Signed)
No urine output for pt from 2300-0500 with purewick in place. Pt dry without any  incontinent episodes. Pt denies abdominal tenderness or pain. Bladder scan performed  340 ml. IVF running at 75 ml/hr. MD notified. No new orders at this time.

## 2020-01-01 NOTE — TOC Transition Note (Addendum)
Transition of Care [facility]) - CM/SW Discharge Note   Patient Details  Name: Bethany Blake MRN: [id number] Date of Birth: 08-25-1921  Transition of Care [facility]) CM/SW Contact:  Lanier Clam, RN Phone Number: 01/01/2020, 1:52 PM   Clinical Narrative: Spoke to patient/dtr in rm about d/c plans-d/c back to [facility]. Declines SNF-want [facility] @ facility-faxed w/confirmation order. They decline 3n1;PTAR for transport back to [place] will call PTAR when ready.            Patient Goals and CMS Choice        Discharge Placement                       Discharge Plan and Services                                     Social Determinants of Health (SDOH) Interventions     Readmission Risk Interventions No flowsheet data found.

## 2020-01-03 ENCOUNTER — Emergency Department (HOSPITAL_COMMUNITY)
Admission: EM | Admit: 2020-01-03 | Discharge: 2020-01-03 | Disposition: A | Discharge status: Home / Self Care | Payer: Medicare Other | Attending: Emergency Medicine | Admitting: Emergency Medicine

## 2020-01-03 ENCOUNTER — Other Ambulatory Visit: Payer: Self-pay

## 2020-01-03 ENCOUNTER — Encounter (HOSPITAL_COMMUNITY): Payer: Self-pay

## 2020-01-03 ENCOUNTER — Emergency Department (HOSPITAL_COMMUNITY): Payer: Medicare Other

## 2020-01-03 DIAGNOSIS — N189 Chronic kidney disease, unspecified: Secondary | ICD-10-CM | POA: Insufficient documentation

## 2020-01-03 DIAGNOSIS — Z87891 Personal history of nicotine dependence: Secondary | ICD-10-CM | POA: Diagnosis not present

## 2020-01-03 DIAGNOSIS — Z79899 Other long term (current) drug therapy: Secondary | ICD-10-CM | POA: Insufficient documentation

## 2020-01-03 DIAGNOSIS — M545 Low back pain: Secondary | ICD-10-CM | POA: Insufficient documentation

## 2020-01-03 DIAGNOSIS — E039 Hypothyroidism, unspecified: Secondary | ICD-10-CM | POA: Diagnosis not present

## 2020-01-03 DIAGNOSIS — K8020 Calculus of gallbladder without cholecystitis without obstruction: Secondary | ICD-10-CM | POA: Diagnosis not present

## 2020-01-03 DIAGNOSIS — R339 Retention of urine, unspecified: Secondary | ICD-10-CM | POA: Insufficient documentation

## 2020-01-03 DIAGNOSIS — S32591A Other specified fracture of right pubis, initial encounter for closed fracture: Secondary | ICD-10-CM

## 2020-01-03 DIAGNOSIS — I700 Atherosclerosis of aorta: Secondary | ICD-10-CM | POA: Diagnosis not present

## 2020-01-03 DIAGNOSIS — I129 Hypertensive chronic kidney disease with stage 1 through stage 4 chronic kidney disease, or unspecified chronic kidney disease: Secondary | ICD-10-CM | POA: Insufficient documentation

## 2020-01-03 LAB — BASIC METABOLIC PANEL
Anion gap: 12 (ref 5–15)
BUN: 17 mg/dL (ref 8–23)
CO2: 23 mmol/L (ref 22–32)
Calcium: 9 mg/dL (ref 8.9–10.3)
Chloride: 94 mmol/L — ABNORMAL LOW (ref 98–111)
Creatinine, Ser: 0.77 mg/dL (ref 0.44–1.00)
GFR calc Af Amer: >60 mL/min (ref >60)
GFR calc non Af Amer: >60 mL/min (ref >60)
Glucose, Bld: 126 mg/dL — ABNORMAL HIGH (ref 70–99)
Potassium: 3.8 mmol/L (ref 3.5–5.1)
Sodium: 129 mmol/L — ABNORMAL LOW (ref 135–145)

## 2020-01-03 LAB — URINALYSIS, ROUTINE W REFLEX MICROSCOPIC
Bilirubin Urine: NEGATIVE
Glucose, UA: NEGATIVE mg/dL
Ketones, ur: 5 mg/dL — AB
Leukocytes,Ua: NEGATIVE
Nitrite: NEGATIVE
Protein, ur: 30 mg/dL — AB
Specific Gravity, Urine: 1.019 (ref 1.005–1.030)
pH: 5 (ref 5.0–8.0)

## 2020-01-03 LAB — CBC WITH DIFFERENTIAL/PLATELET
Abs Immature Granulocytes: 0.02 10*3/uL (ref 0.00–0.07)
Basophils Absolute: 0 10*3/uL (ref 0.0–0.1)
Basophils Relative: 0 %
Eosinophils Absolute: 0 10*3/uL (ref 0.0–0.5)
Eosinophils Relative: 0 %
HCT: 29.5 % — ABNORMAL LOW (ref 36.0–46.0)
Hemoglobin: 9.4 g/dL — ABNORMAL LOW (ref 12.0–15.0)
Immature Granulocytes: 0 %
Lymphocytes Relative: 9 %
Lymphs Abs: 0.6 10*3/uL — ABNORMAL LOW (ref 0.7–4.0)
MCH: 24 pg — ABNORMAL LOW (ref 26.0–34.0)
MCHC: 31.9 g/dL (ref 30.0–36.0)
MCV: 75.4 fL — ABNORMAL LOW (ref 80.0–100.0)
Monocytes Absolute: 0.4 10*3/uL (ref 0.1–1.0)
Monocytes Relative: 7 %
Neutro Abs: 5.4 10*3/uL (ref 1.7–7.7)
Neutrophils Relative %: 84 %
Platelets: 267 10*3/uL (ref 150–400)
RBC: 3.91 MIL/uL (ref 3.87–5.11)
RDW: 18 % — ABNORMAL HIGH (ref 11.5–15.5)
WBC: 6.4 10*3/uL (ref 4.0–10.5)
nRBC: 0 % (ref 0.0–0.2)

## 2020-01-03 NOTE — ED Provider Notes (Signed)
[facility]-EMERGENCY DEPT Provider Note   CSN: [id number] Arrival date & time: 01/03/20  1101     History Chief Complaint  Patient presents with   Urinary Retention    CRESCENT GOTHAM is a 86 y.o. female.  Patient is a 86 year old female with history of recent admission for subarachnoid hemorrhage and UTI. She is brought here for evaluation of decreased urine output. From what I am told, she has not voided in the past 2 days. She is brought here from her extended care facility for this. Patient denies to me she is experiencing any pain. She denies any fevers or chills. There are no aggravating or alleviating factors.  The history is provided by the patient.       Past Medical History:  Diagnosis Date   Anxiety    Arthritis    Chronic kidney disease    Hypertension    Hypokalemia    Hyponatremia    Hypothyroidism     Patient Active Problem List   Diagnosis Date Noted   Bilateral subdural hematomas (HCC) 12/31/2019   Subarachnoid hemorrhage (HCC) 12/31/2019   Hypothyroidism 12/31/2019   Fall at home, initial encounter 12/31/2019   Aneurysm of right internal carotid artery 12/31/2019   Physical deconditioning 09/02/2014   SIADH (syndrome of inappropriate ADH production) (HCC) 08/28/2014   Essential hypertension 08/28/2014   Confusion 08/28/2014   AKI (acute kidney injury) (HCC) 08/28/2014   Hypochloremia 08/28/2014    Past Surgical History:  Procedure Laterality Date   SHOULDER SURGERY     WRIST SURGERY       OB History   No obstetric history on file.     Family History  Problem Relation Age of Onset   Colon cancer Neg Hx    Colon polyps Neg Hx    Rectal cancer Neg Hx    Stomach cancer Neg Hx     Social History   Tobacco Use   Smoking status: Former Smoker   Smokeless tobacco: Never Used  Substance Use Topics   Alcohol use: No   Drug use: No    Home Medications Prior to Admission medications     Medication Sig Start Date End Date Taking? Authorizing Provider  acetaminophen (TYLENOL) 325 MG tablet Take 650 mg by mouth every 4 (four) hours as needed for moderate pain. Patient not taking: Reported on 12/31/2019    [provider]  carvedilol (COREG) 3.125 MG tablet Take 1 tablet (3.125 mg total) by mouth 2 (two) times daily with a meal. 08/31/14   Leroy Sea, MD  docusate sodium (COLACE) 100 MG capsule Take 100 mg by mouth as needed for mild constipation.     [provider]  hydrALAZINE (APRESOLINE) 50 MG tablet Take 1 tablet (50 mg total) by mouth every 8 (eight) hours. Patient taking differently: Take 50 mg by mouth 3 (three) times daily.  08/31/14   Leroy Sea, MD  SYNTHROID 100 MCG tablet Take 100 mcg by mouth daily. 06/04/19   [provider]    Allergies    Ace inhibitors, Aldactone [spironolactone], Angiotensin receptor blockers, and Sulfa antibiotics  Review of Systems   Review of Systems  All other systems reviewed and are negative.   Physical Exam Updated Vital Signs BP (!) 111/59    Pulse 85    Temp 98.2 F (36.8 C) (Oral)    Resp 18    Ht 5\' 2"  (1.575 m)    Wt 61 kg  SpO2 95%    BMI 24.60 kg/m   Physical Exam Vitals and nursing note reviewed.  Constitutional:      General: She is not in acute distress.    Appearance: She is well-developed. She is not diaphoretic.  HENT:     Head: Normocephalic and atraumatic.  Cardiovascular:     Rate and Rhythm: Normal rate and regular rhythm.     Heart sounds: No murmur heard.  No friction rub. No gallop.   Pulmonary:     Effort: Pulmonary effort is normal. No respiratory distress.     Breath sounds: Normal breath sounds. No wheezing.  Abdominal:     General: Bowel sounds are normal. There is no distension.     Palpations: Abdomen is soft.     Tenderness: There is no abdominal tenderness. There is no right CVA tenderness, left CVA tenderness, guarding or rebound.     Comments:  There is some suprapubic ttp/fullnes.    Musculoskeletal:        General: Normal range of motion.     Cervical back: Normal range of motion and neck supple.  Skin:    General: Skin is warm and dry.  Neurological:     Mental Status: She is alert and oriented to person, place, and time.     ED Results / Procedures / Treatments   Labs (all labs ordered are listed, but only abnormal results are displayed) Labs Reviewed  URINE CULTURE  BASIC METABOLIC PANEL  CBC WITH DIFFERENTIAL/PLATELET  URINALYSIS, ROUTINE W REFLEX MICROSCOPIC    EKG None  Radiology No results found.  Procedures Procedures (including critical care time)  Medications Ordered in ED Medications - No data to display  ED Course  I have reviewed the triage vital signs and the nursing notes.  Pertinent labs & imaging results that were available during my care of the patient were reviewed by me and considered in my medical decision making (see chart for details).    MDM Rules/Calculators/A&P  Patient is a 86 year old female with recent admission for subdural hematoma secondary to a fall.  Patient has since returned home and is having difficulty ambulating.  She is also having urinary retention with no urine output in the past few days.  Patient's work-up today shows CT scan with superior and inferior rami fractures on the right.  CT of the lumbar spine shows no foraminal stenosis or evidence for nerve impingement.  I am uncertain as to the etiology of the urinary retention, but suspect this may be related to her reduced mobility secondary to her pelvic fracture.  At this point, I feel as though patient is appropriate for discharge.  I had a lengthy discussion with the patient's daughter at bedside who is comfortable with her returning to her assisted living facility.  The family has a Comptroller with her 24/7 to assist her.  Family is also concerned about transportation to doctor's appointments as they are due to see  urology next week.  I will place a consult with case management/social work to discuss her transportation and home health needs.  Final Clinical Impression(s) / ED Diagnoses Final diagnoses:  None    Rx / DC Orders ED Discharge Orders    None       Geoffery Lyons, MD 01/03/20 1541

## 2020-01-03 NOTE — ED Triage Notes (Signed)
Patient BIB EMS from [facility]. Pt was here Wednesday and discharged and has been unable to urinate since. A&O to baseline. Pt denies any pain.

## 2020-01-03 NOTE — Discharge Instructions (Addendum)
Follow-up with urology in the next few days.  The contact information for [facility] has been provided in this discharge summary for you to call and make these arrangements.  Weightbearing as tolerated using a walker.  Return to the emergency department for any new and/or concerning symptoms.  A referral has been placed to case management.  They should follow-up with you tomorrow to discuss home health/therapy/transportation assistance.

## 2020-01-03 NOTE — ED Notes (Signed)
PTAR called for transport.  

## 2020-01-03 NOTE — ED Notes (Signed)
Bladder scan showed 

## 2020-01-04 NOTE — TOC Initial Note (Signed)
Transition of Care [facility]) - Initial/Assessment Note    Patient Details  Name: Bethany Blake MRN: [id number] Date of Birth: 1921-05-23  Transition of Care [facility]) CM/SW Contact:    Lockie Pares, RN Phone Number: 01/04/2020, 9:20 AM  Clinical Narrative:                 Spoke to daughter laurie who had questions regarding trasnportation in the community . I told her I would lookt hat up and call her back. Discussed home health RN and PT. She stated that [facility] where he mother lives has some people there who do that, concern about medicare coverage, paying out of pocket now for full time aides. Will call [facility] [employer] to discuss. Will call Jacki Cones back with transportation answers.  Expected Discharge Plan: Assisted Living Barriers to Discharge: No Barriers Identified   Patient Goals and CMS Choice        Expected Discharge Plan and Services Expected Discharge Plan: Assisted Living   Discharge Planning Services: CM Consult   Living arrangements for the past 2 months: Independent Living Facility                                      Prior Living Arrangements/Services Living arrangements for the past 2 months: Independent Living Facility Lives with:: Self Patient language and need for interpreter reviewed:: Yes Do you feel safe going back to the place where you live?: Yes      Need for Family Participation in Patient Care: Yes (Comment) Care giver support system in place?: Yes (comment) Current home services: DME, Homehealth aide (has wheelchair, walker, bsc) Criminal Activity/Legal Involvement Pertinent to Current Situation/Hospitalization: No - Comment as needed  Activities of Daily Living      Permission Sought/Granted      Share Information with NAME: Bobbye Riggs           Emotional Assessment       Orientation: : Oriented to Self, Oriented to Place Alcohol / Substance Use: Not Applicable Psych Involvement: No (comment)  Admission  diagnosis:  urinary retention Patient Active Problem List   Diagnosis Date Noted  . Bilateral subdural hematomas (HCC) 12/31/2019  . Subarachnoid hemorrhage (HCC) 12/31/2019  . Hypothyroidism 12/31/2019  . Fall at home, initial encounter 12/31/2019  . Aneurysm of right internal carotid artery 12/31/2019  . Physical deconditioning 09/02/2014  . SIADH (syndrome of inappropriate ADH production) (HCC) 08/28/2014  . Essential hypertension 08/28/2014  . Confusion 08/28/2014  . AKI (acute kidney injury) (HCC) 08/28/2014  . Hypochloremia 08/28/2014   PCP:  [name], MD Pharmacy:   [facility] [address] [place] - [address] [place] [address] Phone: [phone] Fax: [phone]     Social Determinants of Health (SDOH) Interventions    Readmission Risk Interventions No flowsheet data found.

## 2020-01-04 NOTE — TOC Progression Note (Addendum)
Transition of Care [facility]) - Progression Note    Patient Details  Name: Bethany Blake MRN: [id number] Date of Birth: [birth year]  Transition [facility] Care [facility]) CM/SW Contact  Lockie Pares, RN Phone Number: 01/04/2020, 9:31 AM  Clinical Narrative:    Referred to [facility] for PT and RN accepted Denna Haggard and left message about [facility] and transportation wheelchair vans, [facility] non emergent transport [phone] and Janee Morn [phone]  Expected Discharge Plan: Assisted Living Barriers to Discharge: No Barriers Identified  Expected Discharge Plan and Services Expected Discharge Plan: Assisted Living   Discharge Planning Services: CM Consult   Living arrangements for the past 2 months: Independent Living Facility                                       Social Determinants of Health (SDOH) Interventions    Readmission Risk Interventions No flowsheet data found.

## 2020-01-07 LAB — URINE CULTURE: Culture: >=100000 — AB

## 2020-01-08 ENCOUNTER — Telehealth: Payer: Self-pay

## 2020-01-08 NOTE — Progress Notes (Signed)
ED Antimicrobial Stewardship Positive Culture Follow Up   Bethany Blake is an 86 y.o. female who presented to [facility] on 01/03/2020 with a chief complaint of  Chief Complaint  Patient presents with  . Urinary Retention    Recent Results (from the past 720 hour(s))  Urine Culture     Status: None   Collection Time: 12/31/19  4:38 PM   Specimen: Urine, Random  Result Value Ref Range Status   Specimen Description   Final    URINE, RANDOM Performed at [facility], [address], [place], [place] [address]    Special Requests   Final    NONE Performed at [facility], [address] [place], [place] [address]    Culture   Final    NO GROWTH Performed at [facility], [address], [place], [place] [address]    Report Status 01/01/2020 FINAL  Final  SARS Coronavirus 2 by RT PCR (hospital order, performed in [facility] hospital lab) Nasopharyngeal Nasopharyngeal Swab     Status: None   Collection Time: 12/31/19  6:05 PM   Specimen: Nasopharyngeal Swab  Result Value Ref Range Status   SARS Coronavirus 2 NEGATIVE NEGATIVE Final    Comment: (NOTE) SARS-CoV-2 target nucleic acids are NOT DETECTED.  The SARS-CoV-2 RNA is generally detectable in upper and lower respiratory specimens during the acute phase of infection. The lowest concentration of SARS-CoV-2 viral copies this assay can detect is 250 copies / mL. A negative result does not preclude SARS-CoV-2 infection and should not be used as the sole basis for treatment or other patient management decisions.  A negative result may occur with improper specimen collection / handling, submission of specimen other than nasopharyngeal swab, presence of viral mutation(s) within the areas targeted by this assay, and inadequate number of viral copies (<250 copies / mL). A negative result must be combined with clinical observations, patient history, and epidemiological information.  Fact  Sheet for Patients:   BoilerBrush.com.cy  Fact Sheet for Healthcare Providers: https://pope.com/  This test is not yet approved or  cleared by the [place] FDA and has been authorized for detection and/or diagnosis of SARS-CoV-2 by FDA under an Emergency Use Authorization (EUA).  This EUA will remain in effect (meaning this test can be used) for the duration of the COVID-19 declaration under Section 564(b)(1) of the Act, 21 U.S.C. section 360bbb-3(b)(1), unless the authorization is terminated or revoked sooner.  Performed at [facility], [address], [place], [place] [address]   Urine culture     Status: Abnormal   Collection Time: 01/03/20 11:46 AM   Specimen: Urine, Catheterized  Result Value Ref Range Status   Specimen Description   Final    URINE, CATHETERIZED Performed at [facility], [address] [place], [place] [address]    Special Requests   Final    NONE Performed at [facility], [address] [place], [place] [address]    Culture (A)  Final    >=100,000 COLONIES/mL GRANULICATELLA ADIACENS Standardized susceptibility testing for this organism is not available. Performed at [facility], [address], [place], [place] [address]    Report Status 01/07/2020 FINAL  Final    Plan: - Antibiotic is not indicated at this time  ED Provider: Berle Mull, PA-C   [name] 01/08/2020, 10:13 AM Clinical Pharmacist [phone]

## 2020-01-08 NOTE — Telephone Encounter (Signed)
Post ED Visit - Positive Culture Follow-up  Culture report reviewed by antimicrobial stewardship pharmacist: [facility] []  , Pharm.D. []  Enzo Bi, Pharm.D., BCPS AQ-ID []  , Pharm.D., BCPS []  Celedonio Miyamoto, Pharm.D., BCPS []  [place], Garvin Fila.D., BCPS, AAHIVP []  , Pharm.D., BCPS, AAHIVP []  Georgina Pillion, PharmD, BCPS []  , PharmD, BCPS []  [place], PharmD, BCPS []  [place], PharmD []  , PharmD, BCPS []  Estella Husk, PharmD  Pharmacy Team []  Lysle Pearl, PharmD []  , PharmD []  Phillips Climes, PharmD []  , Rph []  Agapito Games) , PharmD []  Verlan Friends, PharmD []  , PharmD [x]  Mervyn Gay, PharmD []  , PharmD []  Vinnie Level, PharmD []  Wonda Olds, PharmD []  , PharmD []  Len Childs, PharmD   Positive urine culture  and no further patient follow-up is required at this time.  01/08/2020, 10:19 AM

## 2020-01-23 ENCOUNTER — Emergency Department (HOSPITAL_COMMUNITY): Payer: Medicare Other

## 2020-01-23 ENCOUNTER — Other Ambulatory Visit: Payer: Self-pay

## 2020-01-23 ENCOUNTER — Encounter (HOSPITAL_COMMUNITY): Payer: Self-pay | Admitting: Emergency Medicine

## 2020-01-23 ENCOUNTER — Inpatient Hospital Stay (HOSPITAL_COMMUNITY)
Admission: EM | Admit: 2020-01-23 | Discharge: 2020-01-26 | DRG: 689 | Disposition: A | Discharge status: Home Health | Payer: Medicare Other | Attending: Internal Medicine | Admitting: Internal Medicine

## 2020-01-23 DIAGNOSIS — Z20822 Contact with and (suspected) exposure to covid-19: Secondary | ICD-10-CM | POA: Diagnosis present

## 2020-01-23 DIAGNOSIS — E860 Dehydration: Secondary | ICD-10-CM | POA: Diagnosis present

## 2020-01-23 DIAGNOSIS — G9341 Metabolic encephalopathy: Secondary | ICD-10-CM | POA: Diagnosis present

## 2020-01-23 DIAGNOSIS — S065XAA Traumatic subdural hemorrhage with loss of consciousness status unknown, initial encounter: Secondary | ICD-10-CM | POA: Diagnosis present

## 2020-01-23 DIAGNOSIS — S065X9A Traumatic subdural hemorrhage with loss of consciousness of unspecified duration, initial encounter: Secondary | ICD-10-CM | POA: Diagnosis present

## 2020-01-23 DIAGNOSIS — G9340 Encephalopathy, unspecified: Secondary | ICD-10-CM | POA: Diagnosis not present

## 2020-01-23 DIAGNOSIS — N390 Urinary tract infection, site not specified: Secondary | ICD-10-CM | POA: Diagnosis not present

## 2020-01-23 DIAGNOSIS — E039 Hypothyroidism, unspecified: Secondary | ICD-10-CM | POA: Diagnosis present

## 2020-01-23 DIAGNOSIS — N179 Acute kidney failure, unspecified: Secondary | ICD-10-CM | POA: Diagnosis not present

## 2020-01-23 DIAGNOSIS — Z79890 Hormone replacement therapy (postmenopausal): Secondary | ICD-10-CM

## 2020-01-23 DIAGNOSIS — R4182 Altered mental status, unspecified: Secondary | ICD-10-CM

## 2020-01-23 DIAGNOSIS — Z882 Allergy status to sulfonamides status: Secondary | ICD-10-CM

## 2020-01-23 DIAGNOSIS — E222 Syndrome of inappropriate secretion of antidiuretic hormone: Secondary | ICD-10-CM | POA: Diagnosis present

## 2020-01-23 DIAGNOSIS — W19XXXA Unspecified fall, initial encounter: Secondary | ICD-10-CM | POA: Diagnosis present

## 2020-01-23 DIAGNOSIS — B9620 Unspecified Escherichia coli [E. coli] as the cause of diseases classified elsewhere: Secondary | ICD-10-CM | POA: Diagnosis present

## 2020-01-23 DIAGNOSIS — Z87891 Personal history of nicotine dependence: Secondary | ICD-10-CM

## 2020-01-23 DIAGNOSIS — I10 Essential (primary) hypertension: Secondary | ICD-10-CM | POA: Diagnosis present

## 2020-01-23 DIAGNOSIS — Z66 Do not resuscitate: Secondary | ICD-10-CM | POA: Diagnosis present

## 2020-01-23 DIAGNOSIS — I447 Left bundle-branch block, unspecified: Secondary | ICD-10-CM | POA: Diagnosis present

## 2020-01-23 DIAGNOSIS — W19XXXD Unspecified fall, subsequent encounter: Secondary | ICD-10-CM | POA: Diagnosis present

## 2020-01-23 DIAGNOSIS — D638 Anemia in other chronic diseases classified elsewhere: Secondary | ICD-10-CM | POA: Diagnosis present

## 2020-01-23 DIAGNOSIS — I248 Other forms of acute ischemic heart disease: Secondary | ICD-10-CM | POA: Diagnosis present

## 2020-01-23 DIAGNOSIS — Z888 Allergy status to other drugs, medicaments and biological substances status: Secondary | ICD-10-CM

## 2020-01-23 DIAGNOSIS — S065X9D Traumatic subdural hemorrhage with loss of consciousness of unspecified duration, subsequent encounter: Secondary | ICD-10-CM

## 2020-01-23 DIAGNOSIS — R001 Bradycardia, unspecified: Secondary | ICD-10-CM | POA: Diagnosis present

## 2020-01-23 LAB — CBC WITH DIFFERENTIAL/PLATELET
Abs Immature Granulocytes: 0.04 10*3/uL (ref 0.00–0.07)
Basophils Absolute: 0 10*3/uL (ref 0.0–0.1)
Basophils Relative: 0 %
Eosinophils Absolute: 0 10*3/uL (ref 0.0–0.5)
Eosinophils Relative: 0 %
HCT: 37.8 % (ref 36.0–46.0)
Hemoglobin: 11.6 g/dL — ABNORMAL LOW (ref 12.0–15.0)
Immature Granulocytes: 0 %
Lymphocytes Relative: 12 %
Lymphs Abs: 1.2 10*3/uL (ref 0.7–4.0)
MCH: 24.4 pg — ABNORMAL LOW (ref 26.0–34.0)
MCHC: 30.7 g/dL (ref 30.0–36.0)
MCV: 79.4 fL — ABNORMAL LOW (ref 80.0–100.0)
Monocytes Absolute: 0.7 10*3/uL (ref 0.1–1.0)
Monocytes Relative: 7 %
Neutro Abs: 8.1 10*3/uL — ABNORMAL HIGH (ref 1.7–7.7)
Neutrophils Relative %: 81 %
Platelets: 354 10*3/uL (ref 150–400)
RBC: 4.76 MIL/uL (ref 3.87–5.11)
RDW: 21 % — ABNORMAL HIGH (ref 11.5–15.5)
WBC: 10.1 10*3/uL (ref 4.0–10.5)
nRBC: 0 % (ref 0.0–0.2)

## 2020-01-23 LAB — COMPREHENSIVE METABOLIC PANEL
ALT: 12 U/L (ref 0–44)
AST: 22 U/L (ref 15–41)
Albumin: 3.4 g/dL — ABNORMAL LOW (ref 3.5–5.0)
Alkaline Phosphatase: 75 U/L (ref 38–126)
Anion gap: 12 (ref 5–15)
BUN: 21 mg/dL (ref 8–23)
CO2: 24 mmol/L (ref 22–32)
Calcium: 8.8 mg/dL — ABNORMAL LOW (ref 8.9–10.3)
Chloride: 97 mmol/L — ABNORMAL LOW (ref 98–111)
Creatinine, Ser: 1.11 mg/dL — ABNORMAL HIGH (ref 0.44–1.00)
GFR, Estimated: 42 mL/min — ABNORMAL LOW (ref >60)
Glucose, Bld: 104 mg/dL — ABNORMAL HIGH (ref 70–99)
Potassium: 3.6 mmol/L (ref 3.5–5.1)
Sodium: 133 mmol/L — ABNORMAL LOW (ref 135–145)
Total Bilirubin: 1.1 mg/dL (ref 0.3–1.2)
Total Protein: 6 g/dL — ABNORMAL LOW (ref 6.5–8.1)

## 2020-01-23 LAB — URINALYSIS, ROUTINE W REFLEX MICROSCOPIC
Bilirubin Urine: NEGATIVE
Glucose, UA: 50 mg/dL — AB
Ketones, ur: 5 mg/dL — AB
Nitrite: NEGATIVE
Protein, ur: >=300 mg/dL — AB
RBC / HPF: >50 RBC/hpf — ABNORMAL HIGH (ref 0–5)
Specific Gravity, Urine: 1.019 (ref 1.005–1.030)
WBC, UA: >50 WBC/hpf — ABNORMAL HIGH (ref 0–5)
pH: 6 (ref 5.0–8.0)

## 2020-01-23 LAB — LACTIC ACID, PLASMA
Lactic Acid, Venous: 1.6 mmol/L (ref 0.5–1.9)
Lactic Acid, Venous: 1.4 mmol/L (ref 0.5–1.9)

## 2020-01-23 LAB — LIPASE, BLOOD: Lipase: 35 U/L (ref 11–51)

## 2020-01-23 LAB — TROPONIN I (HIGH SENSITIVITY): Troponin I (High Sensitivity): 228 ng/L (ref <18)

## 2020-01-23 MED ORDER — SODIUM CHLORIDE 0.9 % IV BOLUS
500.0000 mL | Freq: Once | INTRAVENOUS | Status: AC
  Administered 2020-01-23: 500 mL via INTRAVENOUS

## 2020-01-23 MED ORDER — ACETAMINOPHEN 325 MG PO TABS
650.0000 mg | ORAL_TABLET | Freq: Four times a day (QID) | ORAL | Status: DC | PRN
  Administered 2020-01-24: 650 mg via ORAL
  Filled 2020-01-23: qty 2

## 2020-01-23 MED ORDER — ACETAMINOPHEN 650 MG RE SUPP: 650.0000 mg | Freq: Four times a day (QID) | RECTAL | Status: DC | PRN

## 2020-01-23 MED ORDER — HYDRALAZINE HCL 50 MG PO TABS: 50.0000 mg | ORAL_TABLET | Freq: Three times a day (TID) | ORAL | Status: DC

## 2020-01-23 MED ORDER — CARVEDILOL 3.125 MG PO TABS
3.1250 mg | ORAL_TABLET | Freq: Two times a day (BID) | ORAL | Status: DC
  Administered 2020-01-24: 3.125 mg via ORAL
  Filled 2020-01-23: qty 1

## 2020-01-23 MED ORDER — SODIUM CHLORIDE 1 G PO TABS
1.0000 g | ORAL_TABLET | ORAL | Status: DC
  Administered 2020-01-25 – 2020-01-26 (×2): 1 g via ORAL
  Filled 2020-01-23 (×2): qty 1

## 2020-01-23 MED ORDER — SODIUM CHLORIDE 0.9 % IV SOLN
1.0000 g | Freq: Once | INTRAVENOUS | Status: AC
  Administered 2020-01-23: 1 g via INTRAVENOUS
  Filled 2020-01-23: qty 10

## 2020-01-23 MED ORDER — SODIUM CHLORIDE 0.9 % IV SOLN
1.0000 g | INTRAVENOUS | Status: DC
  Administered 2020-01-24: 1 g via INTRAVENOUS
  Filled 2020-01-23 (×2): qty 10

## 2020-01-23 MED ORDER — LEVOTHYROXINE SODIUM 100 MCG PO TABS
100.0000 ug | ORAL_TABLET | Freq: Every day | ORAL | Status: DC
  Administered 2020-01-24: 100 ug via ORAL
  Filled 2020-01-23: qty 1

## 2020-01-23 NOTE — ED Triage Notes (Signed)
Pt form [facility] independent living, per EMS, daughter reports pt has had UTI symptoms x3 days. Daughter reports increased confusion and weakness with sediment in the urine. Family reports indwelling catheter was changed today.   20G RAC. 550 NS. HR 90, RR 24, BP 90/60

## 2020-01-23 NOTE — H&P (Addendum)
History and Physical    Bethany Blake ZSW:[id number] DOB: [birth year] DOA: 01/23/2020  PCP: [name], MD  Patient coming from: Independent living facility.  Chief Complaint: Increasing confusion.  History obtained from ER physician.  HPI: Bethany Blake is a 86 y.o. female with with recent admission for fall and subdural hematoma discharged back to the living facility was found to be increasingly confused and urine concerning for urine infection as observed by patient's daughter.  Per the report patient was found to be increasingly confused last 3 days and patient has chronic indwelling catheter which was changed on the way to the hospital today.  Patient's daughter was concerned that patient may have urine infection because her confusion and increased urine sediments.  No further falls as per the report.  Patient did not complain of any productive cough chest pain or shortness of breath.  ED Course: In the ER patient was afebrile mildly confused CT head showing changes expected from the recent subdural hematoma.  Patient appeared nonfocal.  Urine is consistent with UTI.  Chest x-ray unremarkable Covid test negative.  Labs show hemoglobin of 11.6 creatinine increased from baseline of normal and it was around 1.1.  Sodium 133.  Patient did receive fluid bolus in the ER.  High sensitive troponin was 228 patient does not complain of any chest pain EKG shows LBBB.  Patient's blood pressure is found to be in the low normal's.  Lactic acid is normal.  Review of Systems: As per HPI, rest all negative.   Past Medical History:  Diagnosis Date  . Anxiety   . Arthritis   . Chronic kidney disease   . Hypertension   . Hypokalemia   . Hyponatremia   . Hypothyroidism     Past Surgical History:  Procedure Laterality Date  . SHOULDER SURGERY    . WRIST SURGERY       reports that she has quit smoking. She has never used smokeless tobacco. She reports that she does not drink alcohol and does not use  drugs.  Allergies  Allergen Reactions  . Ace Inhibitors Other (See Comments)    Severe hyponatremia  . Aldactone [Spironolactone] Other (See Comments)    Severe hyponatremia  . Angiotensin Receptor Blockers Other (See Comments)    Severe hyponatremia  . Sulfa Antibiotics Rash    Family History  Problem Relation Age of Onset  . Colon cancer Neg Hx   . Colon polyps Neg Hx   . Rectal cancer Neg Hx   . Stomach cancer Neg Hx     Prior to Admission medications   Medication Sig Start Date End Date Taking? Authorizing Provider  acetaminophen (TYLENOL) 500 MG tablet Take 500 mg by mouth every 6 (six) hours as needed for moderate pain.   Yes [provider]  carvedilol (COREG) 3.125 MG tablet Take 1 tablet (3.125 mg total) by mouth 2 (two) times daily with a meal. 08/31/14  Yes Leroy Sea, MD  hydrALAZINE (APRESOLINE) 50 MG tablet Take 1 tablet (50 mg total) by mouth every 8 (eight) hours. Patient taking differently: Take 50 mg by mouth 3 (three) times daily.  08/31/14  Yes Leroy Sea, MD  sodium chloride 1 g tablet Take 1 g by mouth every other day.  01/16/20  Yes [provider]  SYNTHROID 100 MCG tablet Take 100 mcg by mouth daily. 06/04/19  Yes [provider]    Physical Exam: Constitutional: Moderately built and nourished. Vitals:   01/23/20  1541 01/23/20 1749 01/23/20 1941 01/23/20 2000  BP: 126/63 (!) 96/56 (!) 95/50 (!) 96/49  Pulse: 89 79 72 76  Resp: 20 19 13 16   Temp: 97.7 F (36.5 C)     TempSrc: Oral     SpO2: 98% 95% 100% 97%   Eyes: Anicteric no pallor. ENMT: No discharge from the ears eyes nose or mouth.  Neck: No mass felt.  No neck rigidity. Respiratory: No rhonchi or crepitations. Cardiovascular: S1-S2 heard. Abdomen: Soft nontender bowel sounds present. Musculoskeletal: No edema. Skin: No rash. Neurologic: Alert awake oriented to name follows commands moves all extremities. Psychiatric: Oriented to her name.   Labs  on Admission: I have personally reviewed following labs and imaging studies  CBC: Recent Labs  Lab 01/23/20 1539  WBC 10.1  NEUTROABS 8.1*  HGB 11.6*  HCT 37.8  MCV 79.4*  PLT 354   Basic Metabolic Panel: Recent Labs  Lab 01/23/20 1722  NA 133*  K 3.6  CL 97*  CO2 24  GLUCOSE 104*  BUN 21  CREATININE 1.11*  CALCIUM 8.8*   GFR: CrCl cannot be calculated (Unknown ideal weight.). Liver Function Tests: Recent Labs  Lab 01/23/20 1722  AST 22  ALT 12  ALKPHOS 75  BILITOT 1.1  PROT 6.0*  ALBUMIN 3.4*   Recent Labs  Lab 01/23/20 1722  LIPASE 35   No results for input(s): AMMONIA in the last 168 hours. Coagulation Profile: No results for input(s): INR, PROTIME in the last 168 hours. Cardiac Enzymes: No results for input(s): CKTOTAL, CKMB, CKMBINDEX, TROPONINI in the last 168 hours. BNP (last 3 results) No results for input(s): PROBNP in the last 8760 hours. HbA1C: No results for input(s): HGBA1C in the last 72 hours. CBG: No results for input(s): GLUCAP in the last 168 hours. Lipid Profile: No results for input(s): CHOL, HDL, LDLCALC, TRIG, CHOLHDL, LDLDIRECT in the last 72 hours. Thyroid Function Tests: No results for input(s): TSH, T4TOTAL, FREET4, T3FREE, THYROIDAB in the last 72 hours. Anemia Panel: No results for input(s): VITAMINB12, FOLATE, FERRITIN, TIBC, IRON, RETICCTPCT in the last 72 hours. Urine analysis:    Component Value Date/Time   COLORURINE AMBER (A) 01/23/2020 1546   APPEARANCEUR CLOUDY (A) 01/23/2020 1546   LABSPEC 1.019 01/23/2020 1546   PHURINE 6.0 01/23/2020 1546   GLUCOSEU 50 (A) 01/23/2020 1546   HGBUR SMALL (A) 01/23/2020 1546   BILIRUBINUR NEGATIVE 01/23/2020 1546   KETONESUR 5 (A) 01/23/2020 1546   PROTEINUR >=300 (A) 01/23/2020 1546   UROBILINOGEN 0.2 08/28/2014 0227   NITRITE NEGATIVE 01/23/2020 1546   LEUKOCYTESUR LARGE (A) 01/23/2020 1546   Sepsis Labs: @LABRCNTIP (procalcitonin:4,lacticidven:4) )No results found for  this or any previous visit (from the past 240 hour(s)).   Radiological Exams on Admission: CT Head Wo Contrast  Result Date: 01/23/2020 CLINICAL DATA:  Confusion and weakness. Recent intracranial hemorrhage. EXAM: CT HEAD WITHOUT CONTRAST TECHNIQUE: Contiguous axial images were obtained from the base of the skull through the vertex without intravenous contrast. COMPARISON:  01/01/2020 FINDINGS: Brain: L lesion or a findings in the bilateral subdural hematomas. The left-sided subdural hematoma demonstrates overall reduction in density, currently considered subacute, and roughly similar in size to the 01/01/2020 exam with about 1 cm of thickness. The thickening along the falx is reduced compared to 01/01/2020. Similar evolutionary changes in the previous right parietal subdural hemorrhage which is subacute and relatively indistinct due to being of similar density to the adjacent cortex. There is a 0.6 cm of left-to-right midline  shift, stable. Previous focal hemorrhage along the right insular cortex no longer readily apparent. No hydrocephalus or new hemorrhage is identified. The brainstem and cerebellum appear unremarkable. No acute CVA or mass lesion identified. 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 Skull: Unremarkable Sinuses/Orbits: Unremarkable Other: No supplemental non-categorized findings. IMPRESSION: 1. Evolutionary changes in the bilateral subdural hematomas, now currently subacute, without increase in size. The previous hematoma along the falx has resolved. Stable 6 mm of left-to-right midline shift. 2. Interval resolution of the small focal hematoma/contusion along the right insular cortex. 3. 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 Electronically Signed   By: Gaylyn Rong M.D.   On: 01/23/2020 18:50   DG Chest Port 1 View  Result Date: 01/23/2020 CLINICAL DATA:  Weakness EXAM: PORTABLE CHEST 1 VIEW  COMPARISON:  12/31/2019 FINDINGS: There are chronic appearing changes of the proximal right humerus. The heart size is stable. The lung volumes are low. Aortic calcifications are noted. There is an opacity at the left costophrenic angle. There is no pneumothorax. IMPRESSION: 1. No acute cardiopulmonary process. 2. Low lung volumes. 3. Opacity at the left costophrenic angle may represent atelectasis or infiltrate. Electronically Signed   By: Katherine Mantle M.D.   On: 01/23/2020 16:43    EKG: Independently reviewed.  Normal sinus rhythm LBBB.  Assessment/Plan Principal Problem:   Acute encephalopathy Active Problems:   SIADH (syndrome of inappropriate ADH production) (HCC)   Essential hypertension   AKI (acute kidney injury) (HCC)   Bilateral subdural hematomas (HCC)    1. Acute encephalopathy likely due to urinary tract infection and acute renal failure.  Patient received fluid bolus in the ER will avoid further fluids since patient has history of SIADH.  On antibiotics for urine infection follow cultures. 2. Elevated troponin patient denies any chest pain EKG shows LBBB which is chronic.  Will trend cardiac markers not on aspirin due to recent subdural hematoma.  Consult cardiology. 3. Hypertension -blood pressure is found to be in the low normal.  Will hold off patient's hydralazine keep patient on as needed IV adenosine continue Coreg. 4. Acute renal failure could be from dehydration will avoid further fluids since patient has history of SIADH. 5. History of SIADH on sodium chloride tablets.  Fluid restrictions. 6. Recent admission for bilateral subdural hematoma CT scan shows improvement. 7. Chronic indwelling Foley catheter. 8. Anemia appears to be chronic. 9. Hypothyroidism on Synthroid.  Check TSH with next blood draw.   DVT prophylaxis: SCDs.  Avoiding anticoagulation due to recent subdural hematoma. Code Status: DNR. Family Communication: We will need to get further history  with patient's daughter when available. Disposition Plan: To be determined. Consults called: Cardiology. Admission status: Observation.   Eduard Clos MD [facility] Pager [phone].  If 7PM-7AM, please contact night-coverage [email or web address] Password [email or web address]  01/23/2020, 9:02 PM

## 2020-01-23 NOTE — ED Provider Notes (Signed)
[facility]-EMERGENCY DEPT Provider Note   CSN: [id number] Arrival date & time: 01/23/20  1527     History Chief Complaint  Patient presents with  . Urinary Tract Infection    Bethany Blake is a 86 y.o. female.  86 year old female with prior medical history as detailed below presents by EMS for evaluation.  Patient is at [facility] independent living.  Per EMS, the patient's daughter is concerned about decreased p.o. intake over the last 2 to 3 days.  Patient with increased confusion over the last 3 to 4 days.  No documented fevers.  Patient does have a chronic indwelling Foley.  Per EMS report, the Foley has had urine with some sediment.  The patient's daughter is primarily concerned about the possibility of UTI.  Patient appears to be comfortable at time of my exam.  She denies any specific acute complaint.  She does appear to be oriented primarily to person alone.  The history is provided by the patient, the EMS personnel and medical records.  Illness Location:  Confusion, decreased p.o. intake, concern regarding possible UTI. Severity:  Moderate Onset quality:  Gradual Duration:  4 days Timing:  Constant Progression:  Worsening Chronicity:  New Associated symptoms: no fever        Past Medical History:  Diagnosis Date  . Anxiety   . Arthritis   . Chronic kidney disease   . Hypertension   . Hypokalemia   . Hyponatremia   . Hypothyroidism     Patient Active Problem List   Diagnosis Date Noted  . Bilateral subdural hematomas (HCC) 12/31/2019  . Subarachnoid hemorrhage (HCC) 12/31/2019  . Hypothyroidism 12/31/2019  . Fall at home, initial encounter 12/31/2019  . Aneurysm of right internal carotid artery 12/31/2019  . Physical deconditioning 09/02/2014  . SIADH (syndrome of inappropriate ADH production) (HCC) 08/28/2014  . Essential hypertension 08/28/2014  . Confusion 08/28/2014  . AKI (acute kidney injury) (HCC) 08/28/2014  .  Hypochloremia 08/28/2014    Past Surgical History:  Procedure Laterality Date  . SHOULDER SURGERY    . WRIST SURGERY       OB History   No obstetric history on file.     Family History  Problem Relation Age of Onset  . Colon cancer Neg Hx   . Colon polyps Neg Hx   . Rectal cancer Neg Hx   . Stomach cancer Neg Hx     Social History   Tobacco Use  . Smoking status: Former Games developer  . Smokeless tobacco: Never Used  Substance Use Topics  . Alcohol use: No  . Drug use: No    Home Medications Prior to Admission medications   Medication Sig Start Date End Date Taking? Authorizing Provider  carvedilol (COREG) 3.125 MG tablet Take 1 tablet (3.125 mg total) by mouth 2 (two) times daily with a meal. 08/31/14   Leroy Sea, MD  docusate sodium (COLACE) 100 MG capsule Take 100 mg by mouth as needed for mild constipation.     [provider]  hydrALAZINE (APRESOLINE) 50 MG tablet Take 1 tablet (50 mg total) by mouth every 8 (eight) hours. Patient taking differently: Take 50 mg by mouth 3 (three) times daily.  08/31/14   Leroy Sea, MD  SYNTHROID 100 MCG tablet Take 100 mcg by mouth daily. 06/04/19   [provider]    Allergies    Ace inhibitors, Aldactone [spironolactone], Angiotensin receptor blockers, and Sulfa antibiotics  Review of Systems  Review of Systems  Unable to perform ROS: Age  Constitutional: Negative for fever.  All other systems reviewed and are negative.   Physical Exam Updated Vital Signs BP 126/63   Pulse 89   Temp 97.7 F (36.5 C) (Oral)   Resp 20   SpO2 98%   Physical Exam Vitals and nursing note reviewed.  Constitutional:      General: She is not in acute distress.    Appearance: She is well-developed.  HENT:     Head: Normocephalic and atraumatic.  Eyes:     Conjunctiva/sclera: Conjunctivae normal.     Pupils: Pupils are equal, round, and reactive to light.  Cardiovascular:     Rate and Rhythm: Normal rate  and regular rhythm.     Heart sounds: Normal heart sounds.  Pulmonary:     Effort: Pulmonary effort is normal. No respiratory distress.     Breath sounds: Normal breath sounds.  Abdominal:     General: There is no distension.     Palpations: Abdomen is soft.     Tenderness: There is no abdominal tenderness.  Musculoskeletal:        General: No deformity. Normal range of motion.     Cervical back: Normal range of motion and neck supple.  Skin:    General: Skin is warm and dry.  Neurological:     General: No focal deficit present.     Mental Status: She is alert.     Comments: Alert  Oriented to person   No facial droop  Normal speech  No Focal deficit     ED Results / Procedures / Treatments   Labs (all labs ordered are listed, but only abnormal results are displayed) Labs Reviewed  URINALYSIS, ROUTINE W REFLEX MICROSCOPIC - Abnormal; Notable for the following components:      Result Value   Color, Urine AMBER (*)    APPearance CLOUDY (*)    Glucose, UA 50 (*)    Hgb urine dipstick SMALL (*)    Ketones, ur 5 (*)    Protein, ur >=300 (*)    Leukocytes,Ua LARGE (*)    RBC / HPF >50 (*)    WBC, UA >50 (*)    Bacteria, UA MANY (*)    All other components within normal limits  CBC WITH DIFFERENTIAL/PLATELET - Abnormal; Notable for the following components:   Hemoglobin 11.6 (*)    MCV 79.4 (*)    MCH 24.4 (*)    RDW 21.0 (*)    Neutro Abs 8.1 (*)    All other components within normal limits  COMPREHENSIVE METABOLIC PANEL - Abnormal; Notable for the following components:   Sodium 133 (*)    Chloride 97 (*)    Glucose, Bld 104 (*)    Creatinine, Ser 1.11 (*)    Calcium 8.8 (*)    Total Protein 6.0 (*)    Albumin 3.4 (*)    GFR, Estimated 42 (*)    All other components within normal limits  TROPONIN I (HIGH SENSITIVITY) - Abnormal; Notable for the following components:   Troponin I (High Sensitivity) 228 (*)    All other components within normal limits  URINE  CULTURE  CULTURE, BLOOD (ROUTINE X 2)  CULTURE, BLOOD (ROUTINE X 2)  LACTIC ACID, PLASMA  LACTIC ACID, PLASMA  LIPASE, BLOOD    EKG EKG Interpretation  Date/Time:  Friday January 23 2020 15:50:42 EDT Ventricular Rate:  87 PR Interval:    QRS Duration: 150 QT  Interval:  444 QTC Calculation: 535 R Axis:   -46 Text Interpretation: Sinus rhythm Left bundle branch block Confirmed by Kristine Royal [phone]) on 01/23/2020 4:14:43 PM   Radiology CT Head Wo Contrast  Result Date: 01/23/2020 CLINICAL DATA:  Confusion and weakness. Recent intracranial hemorrhage. EXAM: CT HEAD WITHOUT CONTRAST TECHNIQUE: Contiguous axial images were obtained from the base of the skull through the vertex without intravenous contrast. COMPARISON:  01/01/2020 FINDINGS: Brain: L lesion or a findings in the bilateral subdural hematomas. The left-sided subdural hematoma demonstrates overall reduction in density, currently considered subacute, and roughly similar in size to the 01/01/2020 exam with about 1 cm of thickness. The thickening along the falx is reduced compared to 01/01/2020. Similar evolutionary changes in the previous right parietal subdural hemorrhage which is subacute and relatively indistinct due to being of similar density to the adjacent cortex. There is a 0.6 cm of left-to-right midline shift, stable. Previous focal hemorrhage along the right insular cortex no longer readily apparent. No hydrocephalus or new hemorrhage is identified. The brainstem and cerebellum appear unremarkable. No acute CVA or mass lesion identified. 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 Skull: Unremarkable Sinuses/Orbits: Unremarkable Other: No supplemental non-categorized findings. IMPRESSION: 1. Evolutionary changes in the bilateral subdural hematomas, now currently subacute, without increase in size. The previous hematoma along the falx has resolved. Stable 6 mm of  left-to-right midline shift. 2. Interval resolution of the small focal hematoma/contusion along the right insular cortex. 3. 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 Electronically Signed   By: Gaylyn Rong M.D.   On: 01/23/2020 18:50   DG Chest Port 1 View  Result Date: 01/23/2020 CLINICAL DATA:  Weakness EXAM: PORTABLE CHEST 1 VIEW COMPARISON:  12/31/2019 FINDINGS: There are chronic appearing changes of the proximal right humerus. The heart size is stable. The lung volumes are low. Aortic calcifications are noted. There is an opacity at the left costophrenic angle. There is no pneumothorax. IMPRESSION: 1. No acute cardiopulmonary process. 2. Low lung volumes. 3. Opacity at the left costophrenic angle may represent atelectasis or infiltrate. Electronically Signed   By: Katherine Mantle M.D.   On: 01/23/2020 16:43    Procedures Procedures (including critical care time)  Medications Ordered in ED Medications - No data to display  ED Course  I have reviewed the triage vital signs and the nursing notes.  Pertinent labs & imaging results that were available during my care of the patient were reviewed by me and considered in my medical decision making (see chart for details).    MDM Rules/Calculators/A&P                          MDM  Screen complete  ANALYAH MCCONNON was evaluated in Emergency Department on 01/23/2020 for the symptoms described in the history of present illness. She was evaluated in the context of the global COVID-19 pandemic, which necessitated consideration that the patient might be at risk for infection with the SARS-CoV-2 virus that causes COVID-19. Institutional protocols and algorithms that pertain to the evaluation of patients at risk for COVID-19 are in a state of rapid change based on information released by regulatory bodies including the CDC and federal and state organizations. These policies and algorithms were followed during the patient's  care in the ED.  Patient is presenting for evaluation of AMS.  Work-up suggest that her current confusion  is secondary to UTI.  Rocephin initiated for treatment of this.  Hospitalist services are aware of case.  They will evaluate for admission.    Final Clinical Impression(s) / ED Diagnoses Final diagnoses:  Altered mental status, unspecified altered mental status type  Urinary tract infection without hematuria, site unspecified    Rx / DC Orders ED Discharge Orders    None       Wynetta Fines, MD 01/23/20 1919

## 2020-01-23 NOTE — ED Notes (Signed)
Patient transported to CT 

## 2020-01-23 NOTE — ED Notes (Signed)
Pt difficult stick, only able to obtain one set of blood cultures.

## 2020-01-24 DIAGNOSIS — G9340 Encephalopathy, unspecified: Secondary | ICD-10-CM

## 2020-01-24 DIAGNOSIS — S065X9A Traumatic subdural hemorrhage with loss of consciousness of unspecified duration, initial encounter: Secondary | ICD-10-CM | POA: Diagnosis not present

## 2020-01-24 DIAGNOSIS — I248 Other forms of acute ischemic heart disease: Secondary | ICD-10-CM | POA: Diagnosis present

## 2020-01-24 DIAGNOSIS — E039 Hypothyroidism, unspecified: Secondary | ICD-10-CM | POA: Diagnosis present

## 2020-01-24 DIAGNOSIS — Z888 Allergy status to other drugs, medicaments and biological substances status: Secondary | ICD-10-CM | POA: Diagnosis not present

## 2020-01-24 DIAGNOSIS — Z20822 Contact with and (suspected) exposure to covid-19: Secondary | ICD-10-CM | POA: Diagnosis present

## 2020-01-24 DIAGNOSIS — N179 Acute kidney failure, unspecified: Secondary | ICD-10-CM | POA: Diagnosis present

## 2020-01-24 DIAGNOSIS — D638 Anemia in other chronic diseases classified elsewhere: Secondary | ICD-10-CM | POA: Diagnosis present

## 2020-01-24 DIAGNOSIS — E222 Syndrome of inappropriate secretion of antidiuretic hormone: Secondary | ICD-10-CM | POA: Diagnosis present

## 2020-01-24 DIAGNOSIS — E860 Dehydration: Secondary | ICD-10-CM | POA: Diagnosis present

## 2020-01-24 DIAGNOSIS — R001 Bradycardia, unspecified: Secondary | ICD-10-CM | POA: Diagnosis present

## 2020-01-24 DIAGNOSIS — W19XXXA Unspecified fall, initial encounter: Secondary | ICD-10-CM | POA: Diagnosis present

## 2020-01-24 DIAGNOSIS — S065X9D Traumatic subdural hemorrhage with loss of consciousness of unspecified duration, subsequent encounter: Secondary | ICD-10-CM | POA: Diagnosis not present

## 2020-01-24 DIAGNOSIS — N390 Urinary tract infection, site not specified: Secondary | ICD-10-CM | POA: Diagnosis present

## 2020-01-24 DIAGNOSIS — Z66 Do not resuscitate: Secondary | ICD-10-CM | POA: Diagnosis present

## 2020-01-24 DIAGNOSIS — Z882 Allergy status to sulfonamides status: Secondary | ICD-10-CM | POA: Diagnosis not present

## 2020-01-24 DIAGNOSIS — I10 Essential (primary) hypertension: Secondary | ICD-10-CM | POA: Diagnosis present

## 2020-01-24 DIAGNOSIS — Z79890 Hormone replacement therapy (postmenopausal): Secondary | ICD-10-CM | POA: Diagnosis not present

## 2020-01-24 DIAGNOSIS — I447 Left bundle-branch block, unspecified: Secondary | ICD-10-CM | POA: Diagnosis present

## 2020-01-24 DIAGNOSIS — Z87891 Personal history of nicotine dependence: Secondary | ICD-10-CM | POA: Diagnosis not present

## 2020-01-24 DIAGNOSIS — B9620 Unspecified Escherichia coli [E. coli] as the cause of diseases classified elsewhere: Secondary | ICD-10-CM | POA: Diagnosis present

## 2020-01-24 DIAGNOSIS — W19XXXD Unspecified fall, subsequent encounter: Secondary | ICD-10-CM | POA: Diagnosis present

## 2020-01-24 DIAGNOSIS — G9341 Metabolic encephalopathy: Secondary | ICD-10-CM | POA: Diagnosis present

## 2020-01-24 LAB — CBC WITH DIFFERENTIAL/PLATELET
Abs Immature Granulocytes: 0.02 10*3/uL (ref 0.00–0.07)
Basophils Absolute: 0 10*3/uL (ref 0.0–0.1)
Basophils Relative: 1 %
Eosinophils Absolute: 0.1 10*3/uL (ref 0.0–0.5)
Eosinophils Relative: 2 %
HCT: 28.9 % — ABNORMAL LOW (ref 36.0–46.0)
Hemoglobin: 9.2 g/dL — ABNORMAL LOW (ref 12.0–15.0)
Immature Granulocytes: 0 %
Lymphocytes Relative: 19 %
Lymphs Abs: 1.1 10*3/uL (ref 0.7–4.0)
MCH: 24.5 pg — ABNORMAL LOW (ref 26.0–34.0)
MCHC: 31.8 g/dL (ref 30.0–36.0)
MCV: 76.9 fL — ABNORMAL LOW (ref 80.0–100.0)
Monocytes Absolute: 0.4 10*3/uL (ref 0.1–1.0)
Monocytes Relative: 7 %
Neutro Abs: 4.1 10*3/uL (ref 1.7–7.7)
Neutrophils Relative %: 71 %
Platelets: 327 10*3/uL (ref 150–400)
RBC: 3.76 MIL/uL — ABNORMAL LOW (ref 3.87–5.11)
RDW: 20.6 % — ABNORMAL HIGH (ref 11.5–15.5)
WBC: 5.8 10*3/uL (ref 4.0–10.5)
nRBC: 0 % (ref 0.0–0.2)

## 2020-01-24 LAB — RENAL FUNCTION PANEL
Albumin: 3.2 g/dL — ABNORMAL LOW (ref 3.5–5.0)
Anion gap: 13 (ref 5–15)
BUN: 22 mg/dL (ref 8–23)
CO2: 20 mmol/L — ABNORMAL LOW (ref 22–32)
Calcium: 8.6 mg/dL — ABNORMAL LOW (ref 8.9–10.3)
Chloride: 101 mmol/L (ref 98–111)
Creatinine, Ser: 1.28 mg/dL — ABNORMAL HIGH (ref 0.44–1.00)
GFR, Estimated: 35 mL/min — ABNORMAL LOW (ref >60)
Glucose, Bld: 123 mg/dL — ABNORMAL HIGH (ref 70–99)
Phosphorus: 3.7 mg/dL (ref 2.5–4.6)
Potassium: 3.6 mmol/L (ref 3.5–5.1)
Sodium: 134 mmol/L — ABNORMAL LOW (ref 135–145)

## 2020-01-24 LAB — TROPONIN I (HIGH SENSITIVITY)
Troponin I (High Sensitivity): 105 ng/L (ref <18)
Troponin I (High Sensitivity): 123 ng/L (ref <18)

## 2020-01-24 LAB — RESPIRATORY PANEL BY RT PCR (FLU A&B, COVID)
Influenza A by PCR: NEGATIVE
Influenza B by PCR: NEGATIVE
SARS Coronavirus 2 by RT PCR: NEGATIVE

## 2020-01-24 LAB — TSH: TSH: 13.552 u[IU]/mL — ABNORMAL HIGH (ref 0.350–4.500)

## 2020-01-24 LAB — MRSA PCR SCREENING: MRSA by PCR: NEGATIVE

## 2020-01-24 LAB — MAGNESIUM: Magnesium: 1.7 mg/dL (ref 1.7–2.4)

## 2020-01-24 MED ORDER — ENSURE ENLIVE PO LIQD
237.0000 mL | Freq: Two times a day (BID) | ORAL | Status: DC
  Administered 2020-01-25 – 2020-01-26 (×3): 237 mL via ORAL

## 2020-01-24 MED ORDER — HYDRALAZINE HCL 20 MG/ML IJ SOLN: 10.0000 mg | INTRAMUSCULAR | Status: DC | PRN

## 2020-01-24 MED ORDER — CHLORHEXIDINE GLUCONATE CLOTH 2 % EX PADS
6.0000 | MEDICATED_PAD | Freq: Every day | CUTANEOUS | Status: DC
  Administered 2020-01-24 – 2020-01-25 (×2): 6 via TOPICAL

## 2020-01-24 MED ORDER — SODIUM CHLORIDE 0.9 % IV BOLUS
250.0000 mL | Freq: Once | INTRAVENOUS | Status: AC
  Administered 2020-01-24: 250 mL via INTRAVENOUS

## 2020-01-24 MED ORDER — LEVOTHYROXINE SODIUM 112 MCG PO TABS
112.0000 ug | ORAL_TABLET | Freq: Every day | ORAL | Status: DC
  Administered 2020-01-25 – 2020-01-26 (×2): 112 ug via ORAL
  Filled 2020-01-24 (×2): qty 1

## 2020-01-24 MED ORDER — MAGNESIUM SULFATE 4 GM/100ML IV SOLN
4.0000 g | Freq: Once | INTRAVENOUS | Status: AC
  Administered 2020-01-24: 4 g via INTRAVENOUS
  Filled 2020-01-24: qty 100

## 2020-01-24 NOTE — Evaluation (Signed)
Physical Therapy Evaluation Patient Details Name: Bethany Blake MRN: [id number] DOB: May 13, 1921 Today's Date: 01/24/2020   History of Present Illness  Pt is a 86 y.o. female with with recent admission for fall and subdural hematoma discharged back to the living facility was found to be increasingly confused and urine concerning for urine infection as observed by patient's daughter.  Pt admitted with acute encephalopathy likely due to UTI and acute renal failure.  Clinical Impression  Pt admitted with above diagnosis. Pt was independent in her apartment at ILF until recent hospitalization in Sept. For a fall.  Since that time she has required assist from private caregivers for ambulation and ADLs.  Daughter was present and reports she feels like she gets more/individualized care from ILF with private caregivers then she would from SNF and plans to schedule 24 hr care.  Today pt requiring min-mod A for transfers and to ambulate 2' to recliner with RW.  Demonstrates decreased ROM in bil knees that limit ability to stand upright. Pt currently with functional limitations due to the deficits listed below (see PT Problem List). Pt will benefit from skilled PT to increase their independence and safety with mobility to allow discharge to the venue listed below.       Follow Up Recommendations Supervision/Assistance - 24 hour;Home health PT;Other (comment) (Pt would benefit from SNF level of care but spoke with family and they report they will get 24 hr care in her ILF and HHPT in lieu of SNF)    Equipment Recommendations       Recommendations for Other Services       Precautions / Restrictions Precautions Precautions: Fall      Mobility  Bed Mobility Overal bed mobility: Needs Assistance Bed Mobility: Supine to Sit     Supine to sit: Mod assist;HOB elevated     General bed mobility comments: cues for sequencing; assist for trunk and to scoot forward  Transfers Overall transfer level: Needs  assistance Equipment used: Rolling walker (2 wheeled) Transfers: Sit to/from Stand Sit to Stand: Mod assist         General transfer comment: cues for hand and foot placement and to lean forward; pt with difficulty leaning forward and tries to stand straight up; require mod A to boost up and unable to come to full upright position due to knee flexion and weakness  Ambulation/Gait Ambulation/Gait assistance: Min assist Gait Distance (Feet): 2 Feet Assistive device: Rolling walker (2 wheeled) Gait Pattern/deviations: Step-to pattern;Shuffle Gait velocity: decreased   General Gait Details: knees and trunk flexed, posterior lean with min A to maintain balance and control RW; only able to take steps to chair and required assist for safe descent  Stairs            Wheelchair Mobility    Modified Rankin (Stroke Patients Only)       Balance Overall balance assessment: Needs assistance Sitting-balance support: Bilateral upper extremity supported Sitting balance-Leahy Scale: Poor Sitting balance - Comments: required use of UE and min A initially   Standing balance support: Bilateral upper extremity supported Standing balance-Leahy Scale: Poor Standing balance comment: required min-mod A for balance with RW                             Pertinent Vitals/Pain Pain Assessment: Faces Faces Pain Scale: Hurts a little bit Pain Location: generalized Pain Intervention(s): Limited activity within patient's tolerance;Monitored during session;Repositioned    Home Living  Family/patient expects to be discharged to:: Other (Comment) (Independent Living - [facility]) Living Arrangements: Alone Available Help at Discharge: Personal care attendant;Family (Pt had caregivers 8am-8pm, family looking into 24 hr care) Type of Home: Independent living facility       Home Layout: One level Home Equipment: Walker - 4 wheels;Shower seat - built in;Transport chair;Bedside  commode      Prior Function Level of Independence: Needs assistance   Gait / Transfers Assistance Needed: typically ambulates with rollator to dining hall 3x/day (prior to fall); since recent fall she walking in apartment with assistance and with fear of falling; daughter reports tends to walk with R leg in back  ADL's / Homemaking Assistance Needed: Prior to Sept hospitalizatiion pt was independent with ADLs with difficulty; since that time has had assistance        Hand Dominance        Extremity/Trunk Assessment   Upper Extremity Assessment Upper Extremity Assessment: RUE deficits/detail;LUE deficits/detail RUE Deficits / Details: Significantly limited shoulder ROM baseline and MMT 2/5; elbow and hand decreased ROM but functional, strength MMT at least 3/5 but unable to follow MMT commands LUE Deficits / Details: MMT at lest 3/5 throughotu but difficulty with further commands; ROM elbow and hand WFL, shoulder elvation to ~ 80degrees    Lower Extremity Assessment Lower Extremity Assessment: LLE deficits/detail;RLE deficits/detail RLE Deficits / Details: ROM knee flexion lacking ~20 degrees with tight endfeel, otherwise WFL; MMT at least 3/5 but unable to follow further commands LLE Deficits / Details: ROM knee flexion lacking ~20 degrees with tight endfeel, otherwise WFL; MMT at least 3/5 but unable to follow further commands    Cervical / Trunk Assessment Cervical / Trunk Assessment: Kyphotic  Communication      Cognition Arousal/Alertness: Awake/alert Behavior During Therapy: WFL for tasks assessed/performed Overall Cognitive Status: History of cognitive impairments - at baseline                                 General Comments: HOH; sometimes required increased time to respond; daughter reports seems more like baseline cognition now - maybe some short term memory issues, slight confusion; pt followed simple commands and was oriented x 3      General  Comments General comments (skin integrity, edema, etc.): vss    Exercises     Assessment/Plan    PT Assessment Patient needs continued PT services  PT Problem List Decreased strength;Decreased mobility;Decreased safety awareness;Decreased range of motion;Decreased coordination;Decreased activity tolerance;Decreased cognition;Decreased balance;Decreased knowledge of use of DME       PT Treatment Interventions DME instruction;Therapeutic activities;Gait training;Therapeutic exercise;Patient/family education;Cognitive remediation;Balance training;Functional mobility training    PT Goals (Current goals can be found in the Care Plan section)  Acute Rehab PT Goals Patient Stated Goal: not stated; per daughter return to ILF with 24 hr care PT Goal Formulation: With patient/family Time For Goal Achievement: 02/07/20 Potential to Achieve Goals: Fair    Frequency Min 3X/week   Barriers to discharge        Co-evaluation               AM-PAC PT "6 Clicks" Mobility  Outcome Measure Help needed turning from your back to your side while in a flat bed without using bedrails?: A Lot Help needed moving from lying on your back to sitting on the side of a flat bed without using bedrails?: A Lot Help needed moving to  and from a bed to a chair (including a wheelchair)?: A Lot Help needed standing up from a chair using your arms (e.g., wheelchair or bedside chair)?: A Lot Help needed to walk in hospital room?: A Lot Help needed climbing 3-5 steps with a railing? : Total 6 Click Score: 11    End of Session Equipment Utilized During Treatment: Gait belt Activity Tolerance: Patient tolerated treatment well Patient left: in chair;with chair alarm set;with call bell/phone within reach;with family/visitor present Nurse Communication: Mobility status PT Visit Diagnosis: Other abnormalities of gait and mobility (R26.89);History of falling (Z91.81);Muscle weakness (generalized) (M62.81)    Time:  5366-4403 PT Time Calculation (min) (ACUTE ONLY): 30 min   Charges:   PT Evaluation $PT Eval Low Complexity: 1 Low PT Treatments $Therapeutic Activity: 8-22 mins        Anise Salvo, PT Acute Rehab Services Pager [phone] [facility] [phone]    Rayetta Humphrey 01/24/2020, 10:27 AM

## 2020-01-24 NOTE — Progress Notes (Signed)
Initial Nutrition Assessment  RD working remotely.  DOCUMENTATION CODES:   Not applicable  INTERVENTION:   - Liberalize diet to Regular, verbal with readback order placed per MD  - Ensure Enlive po BID, each supplement provides 350 kcal and 20 grams of protein  NUTRITION DIAGNOSIS:   Increased nutrient needs related to wound healing, acute illness (UTI) as evidenced by estimated needs.  GOAL:   Patient will meet greater than or equal to 90% of their needs  MONITOR:   PO intake, Supplement acceptance, Weight trends, Labs, Skin  REASON FOR ASSESSMENT:   Malnutrition Screening Tool    ASSESSMENT:   86 year old female who presented to the ED on 10/15 with UTI. PMH of anxiety, arthritis, CKD, HTN, hypothyroidism. Recent admission for fall with SDH.   Pt currently on a Heart Healthy diet with no meal completions recorded at this time. Discussed diet liberalization with MD who agreed. Verbal with readback order placed for Regular diet.  RD was unable to reach pt via phone call to room. Unable to obtain diet and weight history from pt at this time. Reviewed weight history in chart. Weight of 61 kg appears carried over from multiple previous encounters (09/11/14, 09/15/14, 12/31/19, and 01/03/20). Difficult to determine pt's true weight at this time.  RD will order oral nutrition supplements to aid pt in meeting kcal and protein needs during admission.  Medications reviewed and include: sodium chloride tablet every other day, IV abx, IV magnesium sulfate 4 grams once  Labs reviewed: sodium 133, hemoglobin 9.2  NUTRITION - FOCUSED PHYSICAL EXAM:  Unable to complete at this time. RD working remotely.  Diet Order:   Diet Order            Diet regular Room service appropriate? Yes; Fluid consistency: Thin  Diet effective now                 EDUCATION NEEDS:   No education needs have been identified at this time  Skin:  Skin Assessment: Skin Integrity Issues: DTI: left  ankle, buttocks  Last BM:  no documented BM  Height:   Ht Readings from Last 1 Encounters:  01/23/20 5\' 2"  (1.575 m)    Weight:   Wt Readings from Last 1 Encounters:  01/23/20 61 kg    BMI:  Body mass index is 24.6 kg/m.  Estimated Nutritional Needs:   Kcal:  1350-1550  Protein:  65-80 grams  Fluid:  1.3-1.5 L    01/25/20, MS, RD, LDN Inpatient Clinical Dietitian Please see AMiON for contact information.

## 2020-01-24 NOTE — Progress Notes (Signed)
PROGRESS NOTE    DAJANEE VOORHEIS  CBJ:[id number] DOB: 08-24-1921 DOA: 01/23/2020 PCP: [name], MD    Chief Complaint  Patient presents with  . Urinary Tract Infection    Brief Narrative:  HPI per Dr. [name] is a 86 y.o. female with with recent admission for fall and subdural hematoma discharged back to the living facility was found to be increasingly confused and urine concerning for urine infection as observed by patient's daughter.  Per the report patient was found to be increasingly confused last 3 days and patient has chronic indwelling catheter which was changed on the way to the hospital today.  Patient's daughter was concerned that patient may have urine infection because her confusion and increased urine sediments.  No further falls as per the report.  Patient did not complain of any productive cough chest pain or shortness of breath.  ED Course: In the ER patient was afebrile mildly confused CT head showing changes expected from the recent subdural hematoma.  Patient appeared nonfocal.  Urine is consistent with UTI.  Chest x-ray unremarkable Covid test negative.  Labs show hemoglobin of 11.6 creatinine increased from baseline of normal and it was around 1.1.  Sodium 133.  Patient did receive fluid bolus in the ER.  High sensitive troponin was 228 patient does not complain of any chest pain EKG shows LBBB.  Patient's blood pressure is found to be in the low normal's.  Lactic acid is normal  Assessment & Plan:   Principal Problem:   Acute encephalopathy Active Problems:   SIADH (syndrome of inappropriate ADH production) (HCC)   Essential hypertension   AKI (acute kidney injury) (HCC)   Bilateral subdural hematomas (HCC)  1 acute metabolic encephalopathy likely secondary to urinary tract infection and acute renal failure/UTI Patient presented with worsening confusion, urinalysis concerning for UTI.  Patient also noted to be in acute renal failure.  Patient with  history of SIADH.  Patient received some fluid boluses in the ED.  Patient with some clinical improvement.  Urine cultures pending.  Check blood cultures x2.  Continue empiric IV Rocephin.  Supportive care.  Follow.  2.  Elevated troponin Likely secondary to a demand ischemia.  Troponin levels trending down.  Patient denies any chest pain.  EKG with left bundle branch block which is chronic.  We will hold off on cardiology consultation at this time.  Follow.  Supportive care.  3.  Hypertension Patient noted to have borderline blood pressure.  Patient on home regimen Coreg.  Hydralazine on hold.  Follow.  4.  Acute renal failure Likely secondary to prerenal azotemia secondary to dehydration.  Patient with history of SIADH and as such we will hold off on significant IV fluids.  We will give a 250 cc bolus x1.  Labs pending.  5.  History of SIADH on sodium chloride tablets Continue sodium chloride tablets.  Fluid restriction.  Follow.  6.  Recent admission for bilateral subdural hematoma CT head with some improvement.  Outpatient follow-up.  7.  Chronic indwelling Foley catheter Foley catheter noted to have been changed on the way to the hospital.  Patient with urine output of 225 cc.  We will give a normal saline 250 cc bolus x1.  Follow.  8.  Anemia of chronic disease Hemoglobin currently at 9.2.  Likely dilutional effect.  No overt bleeding.  Follow H&H.  9.  Hypothyroidism TSH elevated at 13.552.  Per daughter patient compliant with Synthroid.  Increase Synthroid  to 112 MCG's daily.  Will need repeat thyroid function studies done in about 4 to 6 weeks.  Outpatient follow-up.   DVT prophylaxis: SCDs/recent subdural hematoma Code Status: DNR Family Communication: Updated daughter at bedside. Disposition:   Status is: Inpatient    Dispo: The patient is from: Home              Anticipated d/c is to: Home              Anticipated d/c date is: 2 to 3 days.              Patient  currently on IV antibiotics for UTI leading to acute metabolic encephalopathy.  Not stable for discharge.       Consultants:   None  Procedures:   CT Head 01/23/2019:  Chest x-ray 01/23/2020  Antimicrobials:  IV Rocephin 01/23/2020>>>>   Subjective: Patient sitting up in chair after being placed by physical therapy.  Denies any chest pain.  No shortness of breath.  Alert and oriented x2.  Patient states some improvement with urinary symptoms.  Daughter at bedside.   Objective: Vitals:   01/23/20 2352 01/24/20 0210 01/24/20 0655 01/24/20 0928  BP:  (!) 99/47 (!) 129/55 (!) 132/47  Pulse:  68 73 71  Resp:  18 20 19   Temp:  98.1 F (36.7 C) 98 F (36.7 C) 98.5 F (36.9 C)  TempSrc:  Oral Oral Oral  SpO2:  97% 97% 97%  Weight:      Height: 5\' 2"  (1.575 m)       Intake/Output Summary (Last 24 hours) at 01/24/2020 1023 Last data filed at 01/24/2020 0700 Gross per 24 hour  Intake --  Output 150 ml  Net -150 ml   Filed Weights   01/23/20 2208  Weight: 61 kg    Examination:  General exam: Appears calm and comfortable  Respiratory system: Clear to auscultation. Respiratory effort normal. Cardiovascular system: S1 & S2 heard, RRR. No JVD, murmurs, rubs, gallops or clicks. No pedal edema. Gastrointestinal system: Abdomen is nondistended, soft and nontender. No organomegaly or masses felt. Normal bowel sounds heard. Central nervous system: Alert and oriented. No focal neurological deficits. Extremities: Symmetric 5 x 5 power. Skin: No rashes, lesions or ulcers Psychiatry: Judgement and insight appear normal. Mood & affect appropriate.     Data Reviewed: I have personally reviewed following labs and imaging studies  CBC: Recent Labs  Lab 01/23/20 1539  WBC 10.1  NEUTROABS 8.1*  HGB 11.6*  HCT 37.8  MCV 79.4*  PLT 354    Basic Metabolic Panel: Recent Labs  Lab 01/23/20 1722  NA 133*  K 3.6  CL 97*  CO2 24  GLUCOSE 104*  BUN 21  CREATININE  1.11*  CALCIUM 8.8*    GFR: Estimated Creatinine Clearance: 25.5 mL/min (A) (by C-G formula based on SCr of 1.11 mg/dL (H)).  Liver Function Tests: Recent Labs  Lab 01/23/20 1722  AST 22  ALT 12  ALKPHOS 75  BILITOT 1.1  PROT 6.0*  ALBUMIN 3.4*    CBG: No results for input(s): GLUCAP in the last 168 hours.   Recent Results (from the past 240 hour(s))  Culture, blood (routine x 2)     Status: None (Preliminary result)   Collection Time: 01/23/20  5:21 PM   Specimen: BLOOD  Result Value Ref Range Status   Specimen Description   Final    BLOOD LEFT ANTECUBITAL Performed at [facility], [address] [place], [place] [address]    Special Requests   Final    BOTTLES DRAWN AEROBIC AND ANAEROBIC Blood Culture adequate volume Performed at [facility], [address] [place], [place] [address]    Culture   Final    NO GROWTH < 12 HOURS Performed at [facility], [address], [place], [place] [address]    Report Status PENDING  Incomplete         Radiology Studies: CT Head Wo Contrast  Result Date: 01/23/2020 CLINICAL DATA:  Confusion and weakness. Recent intracranial hemorrhage. EXAM: CT HEAD WITHOUT CONTRAST TECHNIQUE: Contiguous axial images were obtained from the base of the skull through the vertex without intravenous contrast. COMPARISON:  01/01/2020 FINDINGS: Brain: L lesion or a findings in the bilateral subdural hematomas. The left-sided subdural hematoma demonstrates overall reduction in density, currently considered subacute, and roughly similar in size to the 01/01/2020 exam with about 1 cm of thickness. The thickening along the falx is reduced compared to 01/01/2020. Similar evolutionary changes in the previous right parietal subdural hemorrhage which is subacute and relatively indistinct due to being of similar density to the adjacent cortex. There is a 0.6 cm of left-to-right midline shift, stable. Previous  focal hemorrhage along the right insular cortex no longer readily apparent. No hydrocephalus or new hemorrhage is identified. The brainstem and cerebellum appear unremarkable. No acute CVA or mass lesion identified. 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 Skull: Unremarkable Sinuses/Orbits: Unremarkable Other: No supplemental non-categorized findings. IMPRESSION: 1. Evolutionary changes in the bilateral subdural hematomas, now currently subacute, without increase in size. The previous hematoma along the falx has resolved. Stable 6 mm of left-to-right midline shift. 2. Interval resolution of the small focal hematoma/contusion along the right insular cortex. 3. 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 Electronically Signed   By: Gaylyn Rong M.D.   On: 01/23/2020 18:50   DG Chest Port 1 View  Result Date: 01/23/2020 CLINICAL DATA:  Weakness EXAM: PORTABLE CHEST 1 VIEW COMPARISON:  12/31/2019 FINDINGS: There are chronic appearing changes of the proximal right humerus. The heart size is stable. The lung volumes are low. Aortic calcifications are noted. There is an opacity at the left costophrenic angle. There is no pneumothorax. IMPRESSION: 1. No acute cardiopulmonary process. 2. Low lung volumes. 3. Opacity at the left costophrenic angle may represent atelectasis or infiltrate. Electronically Signed   By: Katherine Mantle M.D.   On: 01/23/2020 16:43        Scheduled Meds: . carvedilol  3.125 mg Oral BID  . Chlorhexidine Gluconate Cloth  6 each Topical Daily  . levothyroxine  100 mcg Oral Q0600  . [START ON 01/25/2020] sodium chloride  1 g Oral QODAY   Continuous Infusions: . cefTRIAXone (ROCEPHIN)  IV       LOS: 0 days    Time spent: 35 minutes    Ramiro Harvest, MD [facility]   To contact the attending provider between 7A-7P or the covering provider during after hours 7P-7A, please log  into the web site [email or web address] and access using universal [place] password for that web site. If you do not have the password, please call the hospital operator.  01/24/2020, 10:23 AM

## 2020-01-24 NOTE — Progress Notes (Signed)
Elevated Troponin 123 , MD made aware. SRP, RN

## 2020-01-24 NOTE — Plan of Care (Signed)
  Problem: Education: Goal: Knowledge of General Education information will improve Description Including pain rating scale, medication(s)/side effects and non-pharmacologic comfort measures Outcome: Progressing   

## 2020-01-25 DIAGNOSIS — G9340 Encephalopathy, unspecified: Secondary | ICD-10-CM | POA: Diagnosis not present

## 2020-01-25 DIAGNOSIS — I10 Essential (primary) hypertension: Secondary | ICD-10-CM | POA: Diagnosis not present

## 2020-01-25 DIAGNOSIS — N179 Acute kidney failure, unspecified: Secondary | ICD-10-CM | POA: Diagnosis not present

## 2020-01-25 DIAGNOSIS — S065X9A Traumatic subdural hemorrhage with loss of consciousness of unspecified duration, initial encounter: Secondary | ICD-10-CM | POA: Diagnosis not present

## 2020-01-25 LAB — BASIC METABOLIC PANEL
Anion gap: 8 (ref 5–15)
BUN: 24 mg/dL — ABNORMAL HIGH (ref 8–23)
CO2: 23 mmol/L (ref 22–32)
Calcium: 8 mg/dL — ABNORMAL LOW (ref 8.9–10.3)
Chloride: 101 mmol/L (ref 98–111)
Creatinine, Ser: 0.85 mg/dL (ref 0.44–1.00)
GFR, Estimated: 58 mL/min — ABNORMAL LOW (ref >60)
Glucose, Bld: 88 mg/dL (ref 70–99)
Potassium: 3.7 mmol/L (ref 3.5–5.1)
Sodium: 132 mmol/L — ABNORMAL LOW (ref 135–145)

## 2020-01-25 LAB — CBC WITH DIFFERENTIAL/PLATELET
Abs Immature Granulocytes: 0.01 10*3/uL (ref 0.00–0.07)
Basophils Absolute: 0 10*3/uL (ref 0.0–0.1)
Basophils Relative: 0 %
Eosinophils Absolute: 0.2 10*3/uL (ref 0.0–0.5)
Eosinophils Relative: 3 %
HCT: 25.9 % — ABNORMAL LOW (ref 36.0–46.0)
Hemoglobin: 8.4 g/dL — ABNORMAL LOW (ref 12.0–15.0)
Immature Granulocytes: 0 %
Lymphocytes Relative: 23 %
Lymphs Abs: 1.2 10*3/uL (ref 0.7–4.0)
MCH: 24.9 pg — ABNORMAL LOW (ref 26.0–34.0)
MCHC: 32.4 g/dL (ref 30.0–36.0)
MCV: 76.6 fL — ABNORMAL LOW (ref 80.0–100.0)
Monocytes Absolute: 0.5 10*3/uL (ref 0.1–1.0)
Monocytes Relative: 11 %
Neutro Abs: 3.2 10*3/uL (ref 1.7–7.7)
Neutrophils Relative %: 63 %
Platelets: 267 10*3/uL (ref 150–400)
RBC: 3.38 MIL/uL — ABNORMAL LOW (ref 3.87–5.11)
RDW: 20.3 % — ABNORMAL HIGH (ref 11.5–15.5)
WBC: 5.1 10*3/uL (ref 4.0–10.5)
nRBC: 0 % (ref 0.0–0.2)

## 2020-01-25 LAB — FOLATE: Folate: 10.3 ng/mL (ref >5.9)

## 2020-01-25 LAB — URINE CULTURE: Culture: >=100000 — AB

## 2020-01-25 LAB — VITAMIN B12: Vitamin B-12: 194 pg/mL (ref 180–914)

## 2020-01-25 LAB — IRON AND TIBC
Iron: 30 ug/dL (ref 28–170)
Saturation Ratios: 11 % (ref 10.4–31.8)
TIBC: 273 ug/dL (ref 250–450)
UIBC: 243 ug/dL

## 2020-01-25 LAB — FERRITIN: Ferritin: 62 ng/mL (ref 11–307)

## 2020-01-25 LAB — OCCULT BLOOD X 1 CARD TO LAB, STOOL: Fecal Occult Bld: NEGATIVE

## 2020-01-25 LAB — MAGNESIUM: Magnesium: 2.6 mg/dL — ABNORMAL HIGH (ref 1.7–2.4)

## 2020-01-25 MED ORDER — CEPHALEXIN 500 MG PO CAPS
500.0000 mg | ORAL_CAPSULE | Freq: Two times a day (BID) | ORAL | Status: DC
  Administered 2020-01-25 – 2020-01-26 (×2): 500 mg via ORAL
  Filled 2020-01-25 (×2): qty 1

## 2020-01-25 MED ORDER — CEFAZOLIN SODIUM-DEXTROSE 1-4 GM/50ML-% IV SOLN
1.0000 g | Freq: Three times a day (TID) | INTRAVENOUS | Status: DC
  Administered 2020-01-25: 1 g via INTRAVENOUS
  Filled 2020-01-25: qty 50

## 2020-01-25 MED ORDER — PANTOPRAZOLE SODIUM 40 MG PO TBEC
40.0000 mg | DELAYED_RELEASE_TABLET | Freq: Every day | ORAL | Status: DC
  Administered 2020-01-25 – 2020-01-26 (×2): 40 mg via ORAL
  Filled 2020-01-25 (×2): qty 1

## 2020-01-25 MED ORDER — CEPHALEXIN 500 MG PO CAPS
500.0000 mg | ORAL_CAPSULE | Freq: Two times a day (BID) | ORAL | Status: DC
  Filled 2020-01-25: qty 1

## 2020-01-25 NOTE — TOC Progression Note (Signed)
Transition of Care [facility]) - Progression Note    Patient Details  Name: Bethany Blake MRN: [id number] Date of Birth: 02/17/1922  Transition of Care [facility]) CM/SW Contact  Armanda Heritage, RN Phone Number: 01/25/2020, 2:53 PM  Clinical Narrative:    Patient active with [facility] Boer for [facility].  Agency notified will need to resume  HHPT and OT services.   Expected Discharge Plan: Home/Self Care Barriers to Discharge: No Barriers Identified  Expected Discharge Plan and Services Expected Discharge Plan: Home/Self Care   Discharge Planning Services: CM Consult Post Acute Care Choice: Resumption of Svcs/PTA Provider, Home Health Living arrangements for the past 2 months: Independent Living Facility                 DME Arranged: N/A         HH Arranged: PT, OT HH Agency: [facility] Date HH Agency Contacted: 01/25/20 Time HH Agency Contacted: 1453 Representative spoke with at [facility] Agency: Kenard Gower   Social Determinants of Health (SDOH) Interventions    Readmission Risk Interventions No flowsheet data found.

## 2020-01-25 NOTE — Progress Notes (Signed)
PROGRESS NOTE    Bethany Blake  VHQ:[id number] DOB: [birth year] DOA: 01/23/2020 PCP: Bethany Corn, MD    Chief Complaint  Patient presents with  . Urinary Tract Infection    Brief Narrative:  HPI per Dr. [name] is a 86 y.o. female with with recent admission for fall and subdural hematoma discharged back to the living facility was found to be increasingly confused and urine concerning for urine infection as observed by patient's daughter.  Per the report patient was found to be increasingly confused last 3 days and patient has chronic indwelling catheter which was changed on the way to the hospital today.  Patient's daughter was concerned that patient may have urine infection because her confusion and increased urine sediments.  No further falls as per the report.  Patient did not complain of any productive cough chest pain or shortness of breath.  ED Course: In the ER patient was afebrile mildly confused CT head showing changes expected from the recent subdural hematoma.  Patient appeared nonfocal.  Urine is consistent with UTI.  Chest x-ray unremarkable Covid test negative.  Labs show hemoglobin of 11.6 creatinine increased from baseline of normal and it was around 1.1.  Sodium 133.  Patient did receive fluid bolus in the ER.  High sensitive troponin was 228 patient does not complain of any chest pain EKG shows LBBB.  Patient's blood pressure is found to be in the low normal's.  Lactic acid is normal  Assessment & Plan:   Principal Problem:   Acute encephalopathy Active Problems:   SIADH (syndrome of inappropriate ADH production) (HCC)   Essential hypertension   AKI (acute kidney injury) (HCC)   Bilateral subdural hematomas (HCC)  1 acute metabolic encephalopathy likely secondary to urinary tract infection and acute renal failure/UTI Patient presented with worsening confusion, urinalysis concerning for UTI.  Patient also noted to be in acute renal failure.  Patient with  history of SIADH.  Patient received some fluid boluses in the ED. clinical improvement.  Urine cultures > 100,000 colonies of E. coli.  Blood cultures pending.  Narrow IV antibiotics from IV Rocephin to IV Ancef.  Transition to oral Keflex tomorrow.  Supportive care.  Follow.    2.  Elevated troponin Likely secondary to a demand ischemia.  Troponin levels trending down.  Patient denies any chest pain.  EKG with left bundle branch block which is chronic.  We will hold off on cardiology consultation at this time.  Follow.  Supportive care.  3.  Hypertension Patient noted to have borderline blood pressure.  Patient on home regimen Coreg.  Hydralazine and Coreg held due to some bradycardia noted during the hospitalization.  Blood pressure improved.  Follow.   4.  Acute renal failure Likely secondary to prerenal azotemia secondary to dehydration.  Patient with history of SIADH and as such we will hold off on significant IV fluids.  Status post 250 fluid bolus x1.  Renal function improved.  Follow.    5.  History of SIADH on sodium chloride tablets Continue sodium chloride tablets.  Fluid restriction.  Follow.   6.  Recent admission for bilateral subdural hematoma CT head with some improvement.  Outpatient follow-up.  7.  Chronic indwelling Foley catheter Foley catheter noted to have been changed on the way to the hospital.  Patient with urine output of 525 cc.  Improved with fluid bolus.  Follow.    8.  Anemia of chronic disease Hemoglobin currently at 8.4.  Likely  dilutional effect.  No overt bleeding.  Follow H&H.  9.  Hypothyroidism TSH elevated at 13.552.  Per daughter patient compliant with Synthroid.  Synthroid increased to 112 MCG's daily.  Will need repeat TFTs done in 4 to 6 weeks.  Outpatient follow-up.     DVT prophylaxis: SCDs/recent subdural hematoma Code Status: DNR Family Communication: Updated patient.  No family at bedside.  Disposition:   Status is:  Inpatient    Dispo: The patient is from: Home              Anticipated d/c is to: Home with home health              Anticipated d/c date is: 01/26/2020              Patient currently on IV antibiotics for UTI leading to acute metabolic encephalopathy.  Not stable for discharge.       Consultants:   None  Procedures:   CT Head 01/23/2019:  Chest x-ray 01/23/2020  Antimicrobials:  IV Rocephin 01/23/2020>>>> 01/25/2020  IV Ancef 01/25/2020>>>> 01/26/2020  Oral Keflex 01/26/2020   Subjective: Patient laying in bed. Feeling better. Denies any chest pain or shortness of breath. No abdominal pain. States some improvement with urinary symptoms. Asking when she can go home. Patient sitting up in chair after being placed by physical therapy.  Denies any chest pain.  No shortness of breath.  Alert and oriented x2.  Patient states some improvement with urinary symptoms.  Daughter at bedside.   Objective: Vitals:   01/24/20 0928 01/24/20 1245 01/24/20 2135 01/25/20 0459  BP: (!) 132/47 (!) 105/52 (!) 108/52 (!) 135/54  Pulse: 71 69 67 67  Resp: 19 18 16 16   Temp: 98.5 F (36.9 C) 98.4 F (36.9 C) 97.6 F (36.4 C) 98.3 F (36.8 C)  TempSrc: Oral Oral Oral Oral  SpO2: 97% 97% 97% 99%  Weight:    48.8 kg  Height:        Intake/Output Summary (Last 24 hours) at 01/25/2020 0930 Last data filed at 01/25/2020 0518 Gross per 24 hour  Intake 734.63 ml  Output 425 ml  Net 309.63 ml   Filed Weights   01/23/20 2208 01/25/20 0459  Weight: 61 kg 48.8 kg    Examination:  General exam: NAD Respiratory system: CTAB. No wheezing, no rhonchi, no crackles. Respiratory effort normal. Cardiovascular system: RRR no m/r/g. No LE edema. Gastrointestinal system: Abdomen is soft, nontender, nondistended, positive bowel sounds. No rebound. No guarding. Central nervous system: Alert and oriented. No focal neurological deficits. Extremities: Symmetric 5 x 5 power. Skin: No rashes,  lesions or ulcers Psychiatry: Judgement and insight appear normal. Mood & affect appropriate.     Data Reviewed: I have personally reviewed following labs and imaging studies  CBC: Recent Labs  Lab 01/23/20 1539 01/24/20 1022 01/25/20 0419  WBC 10.1 5.8 5.1  NEUTROABS 8.1* 4.1 3.2  HGB 11.6* 9.2* 8.4*  HCT 37.8 28.9* 25.9*  MCV 79.4* 76.9* 76.6*  PLT 354 327 267    Basic Metabolic Panel: Recent Labs  Lab 01/23/20 1722 01/24/20 1022 01/25/20 0419  NA 133* 134* 132*  K 3.6 3.6 3.7  CL 97* 101 101  CO2 24 20* 23  GLUCOSE 104* 123* 88  BUN 21 22 24*  CREATININE 1.11* 1.28* 0.85  CALCIUM 8.8* 8.6* 8.0*  MG  --  1.7 2.6*  PHOS  --  3.7  --     GFR: Estimated Creatinine Clearance: 29.8 mL/min (  by C-G formula based on SCr of 0.85 mg/dL).  Liver Function Tests: Recent Labs  Lab 01/23/20 1722 01/24/20 1022  AST 22  --   ALT 12  --   ALKPHOS 75  --   BILITOT 1.1  --   PROT 6.0*  --   ALBUMIN 3.4* 3.2*    CBG: No results for input(s): GLUCAP in the last 168 hours.   Recent Results (from the past 240 hour(s))  Urine culture     Status: Abnormal   Collection Time: 01/23/20  3:46 PM   Specimen: Urine, Random  Result Value Ref Range Status   Specimen Description   Final    URINE, RANDOM Performed at [facility], [address], [place], [place] [address]    Special Requests   Final    NONE Performed at [facility], [address] [place], [place] [address]    Culture >=100,000 COLONIES/mL ESCHERICHIA COLI (A)  Final   Report Status 01/25/2020 FINAL  Final   Organism ID, Bacteria ESCHERICHIA COLI (A)  Final      Susceptibility   Escherichia coli - MIC*    AMPICILLIN <=2 SENSITIVE Sensitive     CEFAZOLIN <=4 SENSITIVE Sensitive     CEFTRIAXONE <=0.25 SENSITIVE Sensitive     CIPROFLOXACIN <=0.25 SENSITIVE Sensitive     GENTAMICIN <=1 SENSITIVE Sensitive     IMIPENEM <=0.25 SENSITIVE Sensitive     NITROFURANTOIN  <=16 SENSITIVE Sensitive     TRIMETH/SULFA <=20 SENSITIVE Sensitive     AMPICILLIN/SULBACTAM <=2 SENSITIVE Sensitive     PIP/TAZO <=4 SENSITIVE Sensitive     * >=100,000 COLONIES/mL ESCHERICHIA COLI  Culture, blood (routine x 2)     Status: None (Preliminary result)   Collection Time: 01/23/20  5:21 PM   Specimen: BLOOD  Result Value Ref Range Status   Specimen Description   Final    BLOOD LEFT ANTECUBITAL Performed at [facility], [address], [place], [place] [address]    Special Requests   Final    BOTTLES DRAWN AEROBIC AND ANAEROBIC Blood Culture adequate volume Performed at [facility], [address] [place], [place] [address]    Culture   Final    NO GROWTH 2 DAYS Performed at [facility], [address] [place], [place] [address]    Report Status PENDING  Incomplete  Culture, blood (routine x 2)     Status: None (Preliminary result)   Collection Time: 01/24/20  6:07 AM   Specimen: BLOOD LEFT ARM  Result Value Ref Range Status   Specimen Description   Final    BLOOD LEFT ARM Performed at [facility], [address] [place], [place] [address]    Special Requests   Final    BOTTLES DRAWN AEROBIC AND ANAEROBIC Blood Culture adequate volume Performed at [facility], [address] [place], [place] [address]    Culture   Final    NO GROWTH < 24 HOURS Performed at [facility], [address], [place], [place] [address]    Report Status PENDING  Incomplete  Respiratory Panel by RT PCR (Flu A&B, Covid) -     Status: None   Collection Time: 01/24/20  9:40 AM   Specimen: Nasopharyngeal  Result Value Ref Range Status   SARS Coronavirus 2 by RT PCR NEGATIVE NEGATIVE Final    Comment: (NOTE) SARS-CoV-2 target nucleic acids are NOT DETECTED.  The  SARS-CoV-2 RNA is generally detectable in upper respiratoy specimens during the acute phase of infection. The lowest concentration of SARS-CoV-2  viral copies this assay can detect is 131 copies/mL. A negative result does not preclude SARS-Cov-2 infection and should not be used as the sole basis for treatment or other patient management decisions. A negative result may occur with  improper specimen collection/handling, submission of specimen other than nasopharyngeal swab, presence of viral mutation(s) within the areas targeted by this assay, and inadequate number of viral copies (<131 copies/mL). A negative result must be combined with clinical observations, patient history, and epidemiological information. The expected result is Negative.  Fact Sheet for Patients:  https://www.moore.com/  Fact Sheet for Healthcare Providers:  https://www.young.biz/  This test is no t yet approved or cleared by the [place] FDA and  has been authorized for detection and/or diagnosis of SARS-CoV-2 by FDA under an Emergency Use Authorization (EUA). This EUA will remain  in effect (meaning this test can be used) for the duration of the COVID-19 declaration under Section 564(b)(1) of the Act, 21 U.S.C. section 360bbb-3(b)(1), unless the authorization is terminated or revoked sooner.     Influenza A by PCR NEGATIVE NEGATIVE Final   Influenza B by PCR NEGATIVE NEGATIVE Final    Comment: (NOTE) The Xpert Xpress SARS-CoV-2/FLU/RSV assay is intended as an aid in  the diagnosis of influenza from Nasopharyngeal swab specimens and  should not be used as a sole basis for treatment. Nasal washings and  aspirates are unacceptable for Xpert Xpress SARS-CoV-2/FLU/RSV  testing.  Fact Sheet for Patients: https://www.moore.com/  Fact Sheet for Healthcare Providers: https://www.young.biz/  This test is not yet approved or cleared by the [place] FDA and  has been authorized for detection and/or diagnosis of SARS-CoV-2 by  FDA under an Emergency Use Authorization (EUA).  This EUA will remain  in effect (meaning this test can be used) for the duration of the  Covid-19 declaration under Section 564(b)(1) of the Act, 21  U.S.C. section 360bbb-3(b)(1), unless the authorization is  terminated or revoked. Performed at [facility], [address] [place], [place] [address]   MRSA PCR Screening     Status: None   Collection Time: 01/24/20  1:23 PM   Specimen: Nasal Mucosa; Nasopharyngeal  Result Value Ref Range Status   MRSA by PCR NEGATIVE NEGATIVE Final    Comment:        The GeneXpert MRSA Assay (FDA approved for NASAL specimens only), is one component of a comprehensive MRSA colonization surveillance program. It is not intended to diagnose MRSA infection nor to guide or monitor treatment for MRSA infections. Performed at [facility], [address], [place], [place] [address]          Radiology Studies: CT Head Wo Contrast  Result Date: 01/23/2020 CLINICAL DATA:  Confusion and weakness. Recent intracranial hemorrhage. EXAM: CT HEAD WITHOUT CONTRAST TECHNIQUE: Contiguous axial images were obtained from the base of the skull through the vertex without intravenous contrast. COMPARISON:  01/01/2020 FINDINGS: Brain: L lesion or a findings in the bilateral subdural hematomas. The left-sided subdural hematoma demonstrates overall reduction in density, currently considered subacute, and roughly similar in size to the 01/01/2020 exam with about 1 cm of thickness. The thickening along the falx is reduced compared to 01/01/2020. Similar evolutionary changes in the previous right parietal subdural hemorrhage which is subacute and relatively indistinct due to being of similar density to the adjacent cortex. There is a 0.6  cm of left-to-right midline shift, stable. Previous focal hemorrhage along the right insular cortex no longer readily apparent. No hydrocephalus or new hemorrhage is identified. The brainstem and cerebellum  appear unremarkable. No acute CVA or mass lesion identified. 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 Skull: Unremarkable Sinuses/Orbits: Unremarkable Other: No supplemental non-categorized findings. IMPRESSION: 1. Evolutionary changes in the bilateral subdural hematomas, now currently subacute, without increase in size. The previous hematoma along the falx has resolved. Stable 6 mm of left-to-right midline shift. 2. Interval resolution of the small focal hematoma/contusion along the right insular cortex. 3. 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 Electronically Signed   By: Gaylyn Rong M.D.   On: 01/23/2020 18:50   DG Chest Port 1 View  Result Date: 01/23/2020 CLINICAL DATA:  Weakness EXAM: PORTABLE CHEST 1 VIEW COMPARISON:  12/31/2019 FINDINGS: There are chronic appearing changes of the proximal right humerus. The heart size is stable. The lung volumes are low. Aortic calcifications are noted. There is an opacity at the left costophrenic angle. There is no pneumothorax. IMPRESSION: 1. No acute cardiopulmonary process. 2. Low lung volumes. 3. Opacity at the left costophrenic angle may represent atelectasis or infiltrate. Electronically Signed   By: Katherine Mantle M.D.   On: 01/23/2020 16:43        Scheduled Meds: . Chlorhexidine Gluconate Cloth  6 each Topical Daily  . feeding supplement  237 mL Oral BID BM  . levothyroxine  112 mcg Oral Q0600  . pantoprazole  40 mg Oral Q0600  . sodium chloride  1 g Oral QODAY   Continuous Infusions: . cefTRIAXone (ROCEPHIN)  IV Stopped (01/24/20 1757)     LOS: 1 day    Time spent: 35 minutes    Ramiro Harvest, MD [facility]   To contact the attending provider between 7A-7P or the covering provider during after hours 7P-7A, please log into the web site [email or web address] and access using universal [place] password for that web site. If you do not  have the password, please call the hospital operator.  01/25/2020, 9:30 AM

## 2020-01-25 NOTE — Plan of Care (Signed)
  Problem: Education: Goal: Knowledge of General Education information will improve Description: Including pain rating scale, medication(s)/side effects and non-pharmacologic comfort measures 01/25/2020 1324 by Charmian Muff, RN Outcome: Progressing 01/25/2020 1316 by Charmian Muff, RN Outcome: Progressing

## 2020-01-25 NOTE — Progress Notes (Signed)
Pt IV occluded OK to leave out at this time per MD. Daughter at bedside. SRP, RN

## 2020-01-25 NOTE — Plan of Care (Signed)
  Problem: Education: Goal: Knowledge of General Education information will improve Description Including pain rating scale, medication(s)/side effects and non-pharmacologic comfort measures Outcome: Progressing   

## 2020-01-26 DIAGNOSIS — G9340 Encephalopathy, unspecified: Secondary | ICD-10-CM | POA: Diagnosis not present

## 2020-01-26 DIAGNOSIS — I10 Essential (primary) hypertension: Secondary | ICD-10-CM | POA: Diagnosis not present

## 2020-01-26 DIAGNOSIS — S065X9A Traumatic subdural hemorrhage with loss of consciousness of unspecified duration, initial encounter: Secondary | ICD-10-CM | POA: Diagnosis not present

## 2020-01-26 DIAGNOSIS — N390 Urinary tract infection, site not specified: Secondary | ICD-10-CM

## 2020-01-26 DIAGNOSIS — N179 Acute kidney failure, unspecified: Secondary | ICD-10-CM | POA: Diagnosis not present

## 2020-01-26 DIAGNOSIS — B9620 Unspecified Escherichia coli [E. coli] as the cause of diseases classified elsewhere: Secondary | ICD-10-CM

## 2020-01-26 LAB — BASIC METABOLIC PANEL
Anion gap: 8 (ref 5–15)
BUN: 16 mg/dL (ref 8–23)
CO2: 23 mmol/L (ref 22–32)
Calcium: 8.2 mg/dL — ABNORMAL LOW (ref 8.9–10.3)
Chloride: 102 mmol/L (ref 98–111)
Creatinine, Ser: 0.67 mg/dL (ref 0.44–1.00)
GFR, Estimated: >60 mL/min (ref >60)
Glucose, Bld: 99 mg/dL (ref 70–99)
Potassium: 3.8 mmol/L (ref 3.5–5.1)
Sodium: 133 mmol/L — ABNORMAL LOW (ref 135–145)

## 2020-01-26 LAB — HEMOGLOBIN AND HEMATOCRIT, BLOOD
HCT: 27.7 % — ABNORMAL LOW (ref 36.0–46.0)
Hemoglobin: 8.9 g/dL — ABNORMAL LOW (ref 12.0–15.0)

## 2020-01-26 MED ORDER — CEPHALEXIN 500 MG PO CAPS: 500.0000 mg | ORAL_CAPSULE | Freq: Two times a day (BID) | ORAL | 0 refills | Status: AC

## 2020-01-26 MED ORDER — HYDRALAZINE HCL 50 MG PO TABS: 50.0000 mg | ORAL_TABLET | Freq: Three times a day (TID) | ORAL | Status: DC

## 2020-01-26 MED ORDER — PANTOPRAZOLE SODIUM 40 MG PO TBEC
40.0000 mg | DELAYED_RELEASE_TABLET | Freq: Every day | ORAL | 2 refills | Status: DC
Start: 2020-01-27 — End: 2021-06-19

## 2020-01-26 MED ORDER — CARVEDILOL 3.125 MG PO TABS: 3.1250 mg | ORAL_TABLET | Freq: Two times a day (BID) | ORAL | Status: DC

## 2020-01-26 MED ORDER — LEVOTHYROXINE SODIUM 112 MCG PO TABS: 112.0000 ug | ORAL_TABLET | Freq: Every day | ORAL | 2 refills | Status: DC

## 2020-01-26 NOTE — Discharge Summary (Signed)
Physician Discharge Summary  Bethany Blake KXF:[id number] DOB: 12/03/1921 DOA: 01/23/2020  PCP: [name], MD  Admit date: 01/23/2020 Discharge date: 01/26/2020  Time spent: 55 minutes  Recommendations for Outpatient Follow-up:  1. Patient will be discharged home with home health therapies. 2. Follow-up with [name], MD in 1 to 2 weeks.  On follow-up patient will need a basic metabolic profile done to follow-up on electrolytes and renal function.  CBC will need to be done to follow-up on H&H.  Patient will need repeat thyroid function studies done in about 4 to 6 weeks as patient's Synthroid dose has been increased to 112 MCG's daily.    Discharge Diagnoses:  Principal Problem:   Acute encephalopathy Active Problems:   SIADH (syndrome of inappropriate ADH production) (HCC)   Essential hypertension   AKI (acute kidney injury) (HCC)   Bilateral subdural hematomas (HCC)   E-coli UTI   Discharge Condition: Stable and improved  Diet recommendation: Regular  Filed Weights   01/23/20 2208 01/25/20 0459  Weight: 61 kg 48.8 kg    History of present illness:  HPI per Dr. Rondel Baton Sadek is a 86 y.o. female with with recent admission for fall and subdural hematoma discharged back to the living facility was found to be increasingly confused and urine concerning for urine infection as observed by patient's daughter.  Per the report patient was found to be increasingly confused last 3 days and patient has chronic indwelling catheter which was changed on the way to the hospital today.  Patient's daughter was concerned that patient may have urine infection because her confusion and increased urine sediments.  No further falls as per the report.  Patient did not complain of any productive cough chest pain or shortness of breath.  ED Course: In the ER patient was afebrile mildly confused CT head showing changes expected from the recent subdural hematoma.  Patient appeared nonfocal.   Urine is consistent with UTI.  Chest x-ray unremarkable Covid test negative.  Labs show hemoglobin of 11.6 creatinine increased from baseline of normal and it was around 1.1.  Sodium 133.  Patient did receive fluid bolus in the ER.  High sensitive troponin was 228 patient does not complain of any chest pain EKG shows LBBB.  Patient's blood pressure is found to be in the low normal's.  Lactic acid is normal.  Hospital Course:  1 acute metabolic encephalopathy likely secondary to E. coli urinary tract infection and acute renal failure/E. coli UTI Patient presented with worsening confusion, urinalysis concerning for UTI.  Patient also noted to be in acute renal failure.  Patient with history of SIADH.  Patient received some fluid boluses in the ED. clinical improvement.  Patient placed initially on IV Rocephin pending urine culture results.  Urine cultures > 100,000 colonies of E. coli which was pansensitive.  Patient subsequently transition to oral Keflex which he tolerated.  Patient be discharged home on 3 more days of oral Keflex to complete a 5-day course of antibiotic treatment.  Outpatient follow-up with PCP.  2.  Elevated troponin Likely secondary to a demand ischemia.  Troponin levels trended down.  Patient denied any chest pain.  EKG with left bundle branch block which is chronic.  Outpatient follow-up with PCP.  3.  Hypertension Patient noted to have borderline blood pressure.  Patient on home regimen Coreg and hydralazine.  Hydralazine and Coreg was held during the hospitalization.  Blood pressure improved with fluid boluses.  Patient's antihypertensive medications will be  resumed 3 to 4 days post discharge.  Outpatient follow-up with PCP.  4.  Acute renal failure Likely secondary to prerenal azotemia secondary to dehydration.  Patient with history of SIADH and as such patient received some fluid boluses during the hospitalization.  Renal function improved and was back to baseline by day of  discharge.   5.  History of SIADH on sodium chloride tablets Remained stable.  Patient maintained on home regimen of sodium chloride tablets and fluid restriction.  Outpatient follow-up.   6.  Recent admission for bilateral subdural hematoma CT head with some improvement.  Outpatient follow-up.  7.  Chronic indwelling Foley catheter Foley catheter noted to have been changed on the way to the hospital.  Patient with urine output of 525 cc which improved with a fluid bolus.  Outpatient follow-up.   8.  Anemia of chronic disease Hemoglobin remained stable at 8.4.  Patient had no overt bleeding.  Outpatient follow-up with PCP.    9.  Hypothyroidism TSH elevated at 13.552.  Per daughter patient compliant with Synthroid.  Synthroid increased to 112 MCG's daily.  Will need repeat TFTs done in 4 to 6 weeks.  Outpatient follow-up.     Procedures:  CT head 01/23/2020  Chest x-ray 01/23/2020  Consultations:  None  Discharge Exam: Vitals:   01/26/20 0425 01/26/20 0511  BP: (!) 164/71 (!) 159/73  Pulse: 91 87  Resp: 18   Temp: 98 F (36.7 C)   SpO2: 100%     General: NAD Cardiovascular: RRR Respiratory: CTAB  Discharge Instructions   Discharge Instructions    Diet general   Complete by: As directed    Discharge wound care:   Complete by: As directed    As above   Increase activity slowly   Complete by: As directed      Allergies as of 01/26/2020      Reactions   Ace Inhibitors Other (See Comments)   Severe hyponatremia   Aldactone [spironolactone] Other (See Comments)   Severe hyponatremia   Angiotensin Receptor Blockers Other (See Comments)   Severe hyponatremia   Sulfa Antibiotics Rash      Medication List    TAKE these medications   acetaminophen 500 MG tablet Commonly known as: TYLENOL Take 500 mg by mouth every 6 (six) hours as needed for moderate pain.   carvedilol 3.125 MG tablet Commonly known as: COREG Take 1 tablet (3.125 mg total) by  mouth 2 (two) times daily with a meal. Start taking on: January 28, 2020 What changed: These instructions start on January 28, 2020. If you are unsure what to do until then, ask your doctor or other care provider.   cephALEXin 500 MG capsule Commonly known as: KEFLEX Take 1 capsule (500 mg total) by mouth every 12 (twelve) hours for 3 days.   hydrALAZINE 50 MG tablet Commonly known as: APRESOLINE Take 1 tablet (50 mg total) by mouth 3 (three) times daily. Start taking on: January 30, 2020 What changed:   when to take this  These instructions start on January 30, 2020. If you are unsure what to do until then, ask your doctor or other care provider.   levothyroxine 112 MCG tablet Commonly known as: SYNTHROID Take 1 tablet (112 mcg total) by mouth daily at 6 (six) AM. Start taking on: January 27, 2020 What changed:   medication strength  how much to take  when to take this   pantoprazole 40 MG tablet Commonly known as: PROTONIX Take 1  tablet (40 mg total) by mouth daily at 6 (six) AM. Start taking on: January 27, 2020   sodium chloride 1 g tablet Take 1 g by mouth every other day.            Discharge Care Instructions  (From admission, onward)         Start     Ordered   01/26/20 0000  Discharge wound care:       Comments: As above   01/26/20 0944         Allergies  Allergen Reactions  . Ace Inhibitors Other (See Comments)    Severe hyponatremia  . Aldactone [Spironolactone] Other (See Comments)    Severe hyponatremia  . Angiotensin Receptor Blockers Other (See Comments)    Severe hyponatremia  . Sulfa Antibiotics Rash    Follow-up Information    [facility] Follow up.   Specialty: Home Health Services Why: agency will provide home health physical and occupational therapy.  Contact information: [address] [place] [address] [phone]        [name], MD. Schedule an appointment as soon as possible  for a visit in 2 week(s).   Specialty: Internal Medicine Why: f/u in 1-2 weeks. Contact information: [address] [place] [address] [phone]                The results of significant diagnostics from this hospitalization (including imaging, microbiology, ancillary and laboratory) are listed below for reference.    Significant Diagnostic Studies: CT ABDOMEN PELVIS WO CONTRAST  Result Date: 01/03/2020 CLINICAL DATA:  Abdominal trauma.  Patient unable to void. EXAM: CT ABDOMEN AND PELVIS WITHOUT CONTRAST TECHNIQUE: Multidetector CT imaging of the abdomen and pelvis was performed following the standard protocol without IV contrast. COMPARISON:  02/06/2007 FINDINGS: Lower chest: Mild cardiac enlargement but no pericardial effusion. Aortic and coronary artery calcifications are noted. Bibasilar scarring type change just but no infiltrates or effusions. No pneumothorax. The lower ribs appear intact. Hepatobiliary: No obvious hepatic lesion without contrast. The gallbladder contains calcified gallstones. The largest measures 2 cm. No CT findings suspicious for acute cholecystitis. No common bile duct dilatation. Pancreas: No mass, inflammation or ductal dilatation. Spleen: Normal size.  No focal lesions. Adrenals/Urinary Tract: The adrenal glands and kidneys are grossly normal without contrast. Small calcifications are noted in the right renal hilum which could be vascular. No worrisome renal lesions or hydroureteronephrosis. No obstructing ureteral calculi are identified. There is a Foley catheter in a decompressed bladder. Stomach/Bowel: The stomach, duodenum, small bowel and colon are grossly normal without oral contrast. No obvious acute inflammatory process, mass lesion or obstructive findings. The terminal ileum is normal. The appendix is normal. There is diffuse colonic diverticulosis most severe in the sigmoid region but no definite CT findings for acute diverticulitis.  Vascular/Lymphatic: Advanced vascular calcifications but no definite aneurysm. No mesenteric or retroperitoneal mass or adenopathy. Reproductive: The uterus and ovaries are grossly normal. Other: No pelvic mass or adenopathy. No free pelvic fluid collections. No inguinal mass or adenopathy. No abdominal wall hernia or subcutaneous lesions. Musculoskeletal: The lumbar vertebral bodies are maintained. No acute fracture. The pubic symphysis and SI joints are intact. Both hips are normally located. No significant degenerative changes. There are nondisplaced fractures involving the inferior pubic ramus on the right side. I suspect there is also a subtle nondisplaced fracture involving the right superior pubic ramus on the coronal reformatted images. Remote healed left superior and inferior  inferior pubic ramus fracture noted. IMPRESSION: 1. Nondisplaced right inferior and superior pubic rami fractures. 2. No acute abdominal/pelvic findings, mass lesion or adenopathy. 3. Cholelithiasis. 4. Advanced vascular calcifications. 5. Aortic atherosclerosis. Aortic Atherosclerosis (ICD10-I70.0). Electronically Signed   By: Rudie Meyer M.D.   On: 01/03/2020 15:07   CT Head Wo Contrast  Result Date: 01/23/2020 CLINICAL DATA:  Confusion and weakness. Recent intracranial hemorrhage. EXAM: CT HEAD WITHOUT CONTRAST TECHNIQUE: Contiguous axial images were obtained from the base of the skull through the vertex without intravenous contrast. COMPARISON:  01/01/2020 FINDINGS: Brain: L lesion or a findings in the bilateral subdural hematomas. The left-sided subdural hematoma demonstrates overall reduction in density, currently considered subacute, and roughly similar in size to the 01/01/2020 exam with about 1 cm of thickness. The thickening along the falx is reduced compared to 01/01/2020. Similar evolutionary changes in the previous right parietal subdural hemorrhage which is subacute and relatively indistinct due to being of similar  density to the adjacent cortex. There is a 0.6 cm of left-to-right midline shift, stable. Previous focal hemorrhage along the right insular cortex no longer readily apparent. No hydrocephalus or new hemorrhage is identified. The brainstem and cerebellum appear unremarkable. No acute CVA or mass lesion identified. 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 Skull: Unremarkable Sinuses/Orbits: Unremarkable Other: No supplemental non-categorized findings. IMPRESSION: 1. Evolutionary changes in the bilateral subdural hematomas, now currently subacute, without increase in size. The previous hematoma along the falx has resolved. Stable 6 mm of left-to-right midline shift. 2. Interval resolution of the small focal hematoma/contusion along the right insular cortex. 3. 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 Electronically Signed   By: Gaylyn Rong M.D.   On: 01/23/2020 18:50   CT HEAD WO CONTRAST  Result Date: 01/01/2020 CLINICAL DATA:  Follow-up hemorrhage EXAM: CT HEAD WITHOUT CONTRAST TECHNIQUE: Contiguous axial images were obtained from the base of the skull through the vertex without intravenous contrast. COMPARISON:  12/31/2019 FINDINGS: Brain: Left side subdural hematoma is again noted beginning in the subfrontal region extending over the convexity. This measures 10 mm in thickness over the left frontal lobe and unchanged in overall size and appearance. 6 mm of left-to-right midline shift. Right side subdural hematoma again noted overlying the right parietal lobe measuring up to 7 mm in thickness. Small amount of subarachnoid hemorrhage seen in the right sylvian fissure. No hydrocephalus. No new areas of hemorrhage. Vascular: No hyperdense vessel or unexpected calcification. Skull: No acute calvarial abnormality. Sinuses/Orbits: Visualized paranasal sinuses and mastoids clear. Orbital soft tissues unremarkable. Other: None  IMPRESSION: Stable bilateral subdural hematomas and small subarachnoid hemorrhage in the right sylvian fissure. 6 mm of left-to-right midline shift. No new areas of hemorrhage. Electronically Signed   By: Charlett Nose M.D.   On: 01/01/2020 09:30   CT Head Wo Contrast  Result Date: 12/31/2019 CLINICAL DATA:  Fall EXAM: CT HEAD WITHOUT CONTRAST TECHNIQUE: Contiguous axial images were obtained from the base of the skull through the vertex without intravenous contrast. COMPARISON:  CT head 01/07/2019 FINDINGS: Brain: Left frontal subdural hematoma. This extends from the subfrontal region laterally to the convexity. This measures up to 11 mm in thickness. Right parietal subdural hematoma 8.5 mm. Mild interhemispheric subdural hematoma. Small left parietal subdural hematoma. Small amount of subarachnoid hemorrhage in the right sylvian fissure. Ventricle size normal. Mild midline shift to the right 6.9 mm. Negative for acute infarct or mass.  Vascular: Negative for hyperdense vessel. Suprasellar soft tissue mass on the left with peripheral calcification likely is an aneurysm of the terminal left internal carotid artery. This measures approximately 11 mm in diameter. Skull: Negative for skull fracture Sinuses/Orbits: Paranasal sinuses clear. Mastoid clear. Bilateral cataract extraction. Other: None IMPRESSION: Bilateral subdural hematomas. Largest hematoma in the left frontal region. 6.9 mm midline shift to the right. Small amount of subarachnoid hemorrhage on the right. Suprasellar mass on the left appears to be associated with internal carotid artery and is most consistent with an aneurysm. This measures up to 11 mm in diameter. These results were called by telephone at the time of interpretation on 12/31/2019 at 6:11 pm to provider Lorre Nick , who verbally acknowledged these results. a Electronically Signed   By: Marlan Palau M.D.   On: 12/31/2019 18:13   CT Cervical Spine Wo Contrast  Result Date:  12/31/2019 CLINICAL DATA:  Larey Seat EXAM: CT CERVICAL SPINE WITHOUT CONTRAST TECHNIQUE: Multidetector CT imaging of the cervical spine was performed without intravenous contrast. Multiplanar CT image reconstructions were also generated. COMPARISON:  01/07/2019 FINDINGS: Alignment: There is stable reversal of cervical lordosis from C3 through C5, due to prominent facet hypertrophy and spondylosis. Mild anterolisthesis of C2 on C3 and C3 on C4 is stable. Otherwise alignment is anatomic. Skull base and vertebrae: No acute displaced fracture. Soft tissues and spinal canal: No prevertebral fluid or swelling. No visible canal hematoma. Disc levels: Stable multilevel spondylosis most pronounced at C4-5, C5-6, and C6-7. There is diffuse facet hypertrophy, greatest at C2-3 and C3-4. No change since previous exam. Upper chest: Airway is patent.  Lung apices are clear. Other: Reconstructed images demonstrate no additional findings. IMPRESSION: 1. No acute cervical spine fracture. 2. Stable multilevel spondylosis and facet hypertrophy. Electronically Signed   By: Sharlet Salina M.D.   On: 12/31/2019 19:41   CT Lumbar Spine Wo Contrast  Result Date: 01/03/2020 CLINICAL DATA:  Back pain.  Found on floor. EXAM: CT LUMBAR SPINE WITHOUT CONTRAST TECHNIQUE: Multidetector CT imaging of the lumbar spine was performed without intravenous contrast administration. Multiplanar CT image reconstructions were also generated. COMPARISON:  None. FINDINGS: Segmentation: There are five lumbar type vertebral bodies. The last full intervertebral disc space is labeled L5-S1. Alignment: Severe scoliosis and associated degenerative lumbar spondylosis with multilevel disc disease and facet disease. Normal overall alignment in the sagittal plane. Very mild degenerative anterolisthesis of L3 noted. Vertebrae: Age related advanced osteoporosis. No acute lumbar spine fracture or worrisome bone lesions. Benign appearing large hemangioma noted in the L2  vertebral body. Paraspinal and other soft tissues: No significant paraspinal or retroperitoneal findings. Advanced tortuosity, ectasia and calcification of the abdominal aorta. Calcified gallstones are noted the gallbladder. Disc levels: No obvious large disc protrusions, significant spinal or foraminal stenosis. IMPRESSION: 1. Severe scoliosis and associated degenerative lumbar spondylosis with multilevel disc disease and facet disease. 2. No acute lumbar spine fracture. 3. Age related advanced osteoporosis. 4. Cholelithiasis. 5. Aortic atherosclerosis. Aortic Atherosclerosis (ICD10-I70.0). Electronically Signed   By: Rudie Meyer M.D.   On: 01/03/2020 14:56   DG Chest Port 1 View  Result Date: 01/23/2020 CLINICAL DATA:  Weakness EXAM: PORTABLE CHEST 1 VIEW COMPARISON:  12/31/2019 FINDINGS: There are chronic appearing changes of the proximal right humerus. The heart size is stable. The lung volumes are low. Aortic calcifications are noted. There is an opacity at the left costophrenic angle. There is no pneumothorax. IMPRESSION: 1. No acute cardiopulmonary process. 2. Low lung volumes. 3.  Opacity at the left costophrenic angle may represent atelectasis or infiltrate. Electronically Signed   By: Katherine Mantle M.D.   On: 01/23/2020 16:43   DG Chest Port 1 View  Result Date: 12/31/2019 CLINICAL DATA:  Cough, history of UTI EXAM: PORTABLE CHEST 1 VIEW.  Patient is rotated. COMPARISON:  Chest x-ray 01/07/2019 FINDINGS: The heart size and mediastinal contours are unchanged. Aortic arch and descending thoracic aorta calcification. Similar appearing coarsened interstitial markings. Biapical pleural/pulmonary scarring. No focal consolidation. No pulmonary edema. No pleural effusion. No pneumothorax. No acute displaced fracture. Chronic right shoulder dislocation with sclerotic changes and remodeling of the proximal right humeral shaft/neck. Chronic acromioclavicular joint widening. IMPRESSION: 1. No active  cardiopulmonary disease. 2.  Aortic Atherosclerosis (ICD10-I70.0). Electronically Signed   By: Tish Frederickson M.D.   On: 12/31/2019 16:17    Microbiology: Recent Results (from the past 240 hour(s))  Urine culture     Status: Abnormal   Collection Time: 01/23/20  3:46 PM   Specimen: Urine, Random  Result Value Ref Range Status   Specimen Description   Final    URINE, RANDOM Performed at [facility], [address] [place], [place] [address]    Special Requests   Final    NONE Performed at [facility], [address] [place], [place] [address]    Culture >=100,000 COLONIES/mL ESCHERICHIA COLI (A)  Final   Report Status 01/25/2020 FINAL  Final   Organism ID, Bacteria ESCHERICHIA COLI (A)  Final      Susceptibility   Escherichia coli - MIC*    AMPICILLIN <=2 SENSITIVE Sensitive     CEFAZOLIN <=4 SENSITIVE Sensitive     CEFTRIAXONE <=0.25 SENSITIVE Sensitive     CIPROFLOXACIN <=0.25 SENSITIVE Sensitive     GENTAMICIN <=1 SENSITIVE Sensitive     IMIPENEM <=0.25 SENSITIVE Sensitive     NITROFURANTOIN <=16 SENSITIVE Sensitive     TRIMETH/SULFA <=20 SENSITIVE Sensitive     AMPICILLIN/SULBACTAM <=2 SENSITIVE Sensitive     PIP/TAZO <=4 SENSITIVE Sensitive     * >=100,000 COLONIES/mL ESCHERICHIA COLI  Culture, blood (routine x 2)     Status: None (Preliminary result)   Collection Time: 01/23/20  5:21 PM   Specimen: BLOOD  Result Value Ref Range Status   Specimen Description   Final    BLOOD LEFT ANTECUBITAL Performed at [facility], [address], [place], [place] [address]    Special Requests   Final    BOTTLES DRAWN AEROBIC AND ANAEROBIC Blood Culture adequate volume Performed at [facility], [address], [place], [place] [address]    Culture   Final    NO GROWTH 3 DAYS Performed at [facility], [address] [place], [place] [address]    Report Status PENDING  Incomplete  Culture,  blood (routine x 2)     Status: None (Preliminary result)   Collection Time: 01/24/20  6:07 AM   Specimen: BLOOD LEFT ARM  Result Value Ref Range Status   Specimen Description   Final    BLOOD LEFT ARM Performed at [facility], [address], [place], [place] [address]    Special Requests   Final    BOTTLES DRAWN AEROBIC AND ANAEROBIC Blood Culture adequate volume Performed at [facility], [address], [place], [place] [address]    Culture   Final    NO GROWTH 2 DAYS Performed at [facility], [address] [place], [place] [address]    Report Status PENDING  Incomplete  Respiratory Panel by RT PCR (Flu A&B, Covid) -     Status: None   Collection Time: 01/24/20  9:40 AM   Specimen: Nasopharyngeal  Result Value Ref Range Status   SARS Coronavirus 2 by RT PCR NEGATIVE NEGATIVE Final    Comment: (NOTE) SARS-CoV-2 target nucleic acids are NOT DETECTED.  The SARS-CoV-2 RNA is generally detectable in upper respiratoy specimens during the acute phase of infection. The lowest concentration of SARS-CoV-2 viral copies this assay can detect is 131 copies/mL. A negative result does not preclude SARS-Cov-2 infection and should not be used as the sole basis for treatment or other patient management decisions. A negative result may occur with  improper specimen collection/handling, submission of specimen other than nasopharyngeal swab, presence of viral mutation(s) within the areas targeted by this assay, and inadequate number of viral copies (<131 copies/mL). A negative result must be combined with clinical observations, patient history, and epidemiological information. The expected result is Negative.  Fact Sheet for Patients:  https://www.moore.com/  Fact Sheet for Healthcare Providers:  https://www.young.biz/  This test is no t yet approved or cleared by the [place] FDA and  has been authorized for  detection and/or diagnosis of SARS-CoV-2 by FDA under an Emergency Use Authorization (EUA). This EUA will remain  in effect (meaning this test can be used) for the duration of the COVID-19 declaration under Section 564(b)(1) of the Act, 21 U.S.C. section 360bbb-3(b)(1), unless the authorization is terminated or revoked sooner.     Influenza A by PCR NEGATIVE NEGATIVE Final   Influenza B by PCR NEGATIVE NEGATIVE Final    Comment: (NOTE) The Xpert Xpress SARS-CoV-2/FLU/RSV assay is intended as an aid in  the diagnosis of influenza from Nasopharyngeal swab specimens and  should not be used as a sole basis for treatment. Nasal washings and  aspirates are unacceptable for Xpert Xpress SARS-CoV-2/FLU/RSV  testing.  Fact Sheet for Patients: https://www.moore.com/  Fact Sheet for Healthcare Providers: https://www.young.biz/  This test is not yet approved or cleared by the [place] FDA and  has been authorized for detection and/or diagnosis of SARS-CoV-2 by  FDA under an Emergency Use Authorization (EUA). This EUA will remain  in effect (meaning this test can be used) for the duration of the  Covid-19 declaration under Section 564(b)(1) of the Act, 21  U.S.C. section 360bbb-3(b)(1), unless the authorization is  terminated or revoked. Performed at [facility], [address], [place], [place] [address]   MRSA PCR Screening     Status: None   Collection Time: 01/24/20  1:23 PM   Specimen: Nasal Mucosa; Nasopharyngeal  Result Value Ref Range Status   MRSA by PCR NEGATIVE NEGATIVE Final    Comment:        The GeneXpert MRSA Assay (FDA approved for NASAL specimens only), is one component of a comprehensive MRSA colonization surveillance program. It is not intended to diagnose MRSA infection nor to guide or monitor treatment for MRSA infections. Performed at [facility], [address]  [place], [place] [address]      Labs: Basic Metabolic Panel: Recent Labs  Lab 01/23/20 1722 01/24/20 1022 01/25/20 0419 01/26/20 0417  NA 133* 134* 132* 133*  K 3.6 3.6 3.7 3.8  CL 97* 101 101 102  CO2 24 20* 23 23  GLUCOSE 104* 123* 88 99  BUN 21 22 24* 16  CREATININE 1.11*  1.28* 0.85 0.67  CALCIUM 8.8* 8.6* 8.0* 8.2*  MG  --  1.7 2.6*  --   PHOS  --  3.7  --   --    Liver Function Tests: Recent Labs  Lab 01/23/20 1722 01/24/20 1022  AST 22  --   ALT 12  --   ALKPHOS 75  --   BILITOT 1.1  --   PROT 6.0*  --   ALBUMIN 3.4* 3.2*   Recent Labs  Lab 01/23/20 1722  LIPASE 35   No results for input(s): AMMONIA in the last 168 hours. CBC: Recent Labs  Lab 01/23/20 1539 01/24/20 1022 01/25/20 0419 01/26/20 0417  WBC 10.1 5.8 5.1  --   NEUTROABS 8.1* 4.1 3.2  --   HGB 11.6* 9.2* 8.4* 8.9*  HCT 37.8 28.9* 25.9* 27.7*  MCV 79.4* 76.9* 76.6*  --   PLT 354 327 267  --    Cardiac Enzymes: No results for input(s): CKTOTAL, CKMB, CKMBINDEX, TROPONINI in the last 168 hours. BNP: BNP (last 3 results) No results for input(s): BNP in the last 8760 hours.  ProBNP (last 3 results) No results for input(s): PROBNP in the last 8760 hours.  CBG: No results for input(s): GLUCAP in the last 168 hours.     Signed:  Ramiro Harvest MD.  [facility] 01/26/2020, 9:52 AM

## 2020-01-26 NOTE — Plan of Care (Signed)
  Problem: Education: Goal: Knowledge of General Education information will improve Description Including pain rating scale, medication(s)/side effects and non-pharmacologic comfort measures Outcome: Progressing   Problem: Health Behavior/Discharge Planning: Goal: Ability to manage health-related needs will improve Outcome: Progressing   

## 2020-01-26 NOTE — TOC Transition Note (Signed)
Transition of Care [facility]) - CM/SW Discharge Note   Patient Details  Name: Bethany Blake MRN: [id number] Date of Birth: Jan 21, 1922  [facility] of [facility] CM/SW Contact:  Bethany Cleaver, LCSW Phone Number: 01/26/2020, 11:48 AM   Clinical Narrative:     Patient will be going home with home health through [facility].  CSW signing off please reconsult with any other social work needs, home health agency has been notified of planned discharge.   Final next level of care: Home/Self Care (independent living) Barriers to Discharge: No Barriers Identified   Patient Goals and CMS Choice Patient states their goals for this hospitalization and ongoing recovery are:: To retun to ILF CMS Medicare.gov Compare Post Acute Care list provided to:: Patient Represenative (must comment) Choice offered to / list presented to : Adult Children  Discharge Placement                  Name of family member notified: Patient's daughter Bethany Blake [phone] Patient and family notified of of transfer: 01/26/20  Discharge Plan and Services   Discharge Planning Services: CM Consult Post Acute Care Choice: Resumption of Svcs/PTA Provider, Home Health                    HH Arranged: PT, OT, RN [facility] Agency: [facility] Date [facility] Agency Contacted: 01/26/20 Time HH Agency Contacted: [phone] Representative spoke with at [facility] Agency: Kenard Gower  Social Determinants of Health (SDOH) Interventions     Readmission Risk Interventions No flowsheet data found.

## 2020-01-26 NOTE — Progress Notes (Signed)
Physical Therapy Treatment Patient Details Name: Bethany Blake MRN: [id number] DOB: 04-Nov-1921 Today's Date: 01/26/2020    History of Present Illness Pt is a 86 y.o. female with with recent admission for fall and subdural hematoma discharged back to the living facility was found to be increasingly confused and urine concerning for urine infection as observed by patient's daughter.  Pt admitted with acute encephalopathy likely due to UTI and acute renal failure.    PT Comments    Pt assisted with lower body dressing.  Daughter present and also assisted.  Pt able to transfer to recliner with mod assist (+2) for safety due to decreased balance.     Follow Up Recommendations  Supervision/Assistance - 24 hour;Home health PT (plan is for back to ILF with 24/7 care)     Equipment Recommendations  None recommended by PT    Recommendations for Other Services       Precautions / Restrictions Precautions Precautions: Fall    Mobility  Bed Mobility Overal bed mobility: Needs Assistance Bed Mobility: Supine to Sit     Supine to sit: Min assist     General bed mobility comments: verbal cues for technique, assist for trunk  Transfers Overall transfer level: Needs assistance Equipment used: Rolling walker (2 wheeled) Transfers: Sit to/from [employer] Sit to Stand: Mod assist;+2 safety/equipment Stand pivot transfers: Mod assist;+2 safety/equipment       General transfer comment: pt held onto RW to rise, assist to rise and steady, daughter assisted as well, pt's feet and knees blocked, pt min assist once posterior lean improved, cues for use of UEs on RW to self assist, performed standing twice (assisted pt with lower body dressing) and then transferred to recliner  Ambulation/Gait                 Stairs             Wheelchair Mobility    Modified Rankin (Stroke Patients Only)       Balance           Standing balance support: Bilateral  upper extremity supported Standing balance-Leahy Scale: Poor Standing balance comment: required min-mod A for balance with RW                            Cognition Arousal/Alertness: Awake/alert Behavior During Therapy: WFL for tasks assessed/performed Overall Cognitive Status: History of cognitive impairments - at baseline                                        Exercises      General Comments        Pertinent Vitals/Pain Pain Assessment: Faces Faces Pain Scale: No hurt Pain Intervention(s): Repositioned;Monitored during session    Home Living                      Prior Function            PT Goals (current goals can now be found in the care plan section) Progress towards PT goals: Progressing toward goals    Frequency    Min 3X/week      PT Plan Current plan remains appropriate    Co-evaluation              AM-PAC PT "6 Clicks" Mobility   Outcome Measure  Help  needed turning from your back to your side while in a flat bed without using bedrails?: A Little Help needed moving from lying on your back to sitting on the side of a flat bed without using bedrails?: A Lot Help needed moving to and from a bed to a chair (including a wheelchair)?: A Lot Help needed standing up from a chair using your arms (e.g., wheelchair or bedside chair)?: A Lot Help needed to walk in hospital room?: A Lot Help needed climbing 3-5 steps with a railing? : Total 6 Click Score: 12    End of Session Equipment Utilized During Treatment: Gait belt Activity Tolerance: Patient tolerated treatment well Patient left: in chair;with chair alarm set;with call bell/phone within reach;with family/visitor present Nurse Communication: Mobility status PT Visit Diagnosis: Other abnormalities of gait and mobility (R26.89);History of falling (Z91.81);Muscle weakness (generalized) (M62.81)     Time: 1505-6979 PT Time Calculation (min) (ACUTE ONLY): 15  min  Charges:  $Therapeutic Activity: 8-22 mins                     Thomasene Mohair PT, DPT Acute Rehabilitation Services Pager: [phone] Office: [phone]  Culver Feighner,KATHrine E 01/26/2020, 11:26 AM

## 2020-01-26 NOTE — Progress Notes (Signed)
Pt discharged to home instructions reviewed in details with the daughter. Acknowledged understanding of instructions. SRP, RN

## 2020-01-28 ENCOUNTER — Ambulatory Visit: Payer: Medicare Other | Admitting: Podiatry

## 2020-01-28 LAB — CULTURE, BLOOD (ROUTINE X 2)
Culture: NO GROWTH
Special Requests: ADEQUATE

## 2020-01-29 LAB — CULTURE, BLOOD (ROUTINE X 2)
Culture: NO GROWTH
Special Requests: ADEQUATE

## 2020-02-27 ENCOUNTER — Encounter: Payer: Self-pay | Admitting: Podiatry

## 2020-02-27 ENCOUNTER — Other Ambulatory Visit: Payer: Self-pay

## 2020-02-27 ENCOUNTER — Ambulatory Visit: Payer: Medicare Other | Admitting: Podiatry

## 2020-02-27 DIAGNOSIS — M79676 Pain in unspecified toe(s): Secondary | ICD-10-CM | POA: Diagnosis not present

## 2020-02-27 DIAGNOSIS — L97421 Non-pressure chronic ulcer of left heel and midfoot limited to breakdown of skin: Secondary | ICD-10-CM | POA: Diagnosis not present

## 2020-02-27 DIAGNOSIS — B351 Tinea unguium: Secondary | ICD-10-CM

## 2020-02-27 DIAGNOSIS — L97409 Non-pressure chronic ulcer of unspecified heel and midfoot with unspecified severity: Secondary | ICD-10-CM | POA: Insufficient documentation

## 2020-02-27 DIAGNOSIS — N179 Acute kidney failure, unspecified: Secondary | ICD-10-CM

## 2020-02-27 NOTE — Progress Notes (Signed)
This patient returns to my office for at risk foot care.  This patient requires this care by a professional since this patient will be at risk due to having kidney injury.  Patient has painful callus both forefeet.  This patient is unable to cut nails herself and trim callus  since the patient cannot reach her feet  .These nails and callus  are painful walking and wearing shoes.  This patient presents for at risk foot care today. She presents to the office with her daughter.  Her daughter says her mother has developed an open wound on the back of her left heel.  She is being treated by home nurse.  She says the area has been present for weeks but only now has the open wound started draining.  She requests an evaluation of this open wound.  General Appearance  Alert, conversant and in no acute stress.  Vascular  Dorsalis pedis pulses are palpable  Bilaterally. Posterior tibial pulses are absent  B/L.  Capillary return is within normal limits  bilaterally. Temperature is within normal limits  bilaterally.  Neurologic  Senn-Weinstein monofilament wire test within normal limits  bilaterally. Muscle power within normal limits bilaterally.  Nails Thick disfigured discolored nails with subungual debris  from hallux to fifth toes bilaterally. No evidence of bacterial infection or drainage bilaterally.  Orthopedic  No limitations of motion  feet .  No crepitus or effusions noted.  No bony pathology or digital deformities noted.Hammer toes  B/l.  Overlapping 3rd toe right foot.  Skin  normotropic skin with no porokeratosis noted bilaterally.  No signs of infections or ulcers noted.   Porokeratosis sub 2 left and sub 3 .  Decubital ulcer on the plantar posterior aspect left heel  With skin necrosis left foot.  Necrotic tissue noted over skin necrosis.    Onychomycosis  Pain in right toes  Pain in left toes  Porokeratosis  B/L. Pressure ulcer left foot.  Consent was obtained for treatment procedures.    Mechanical debridement of nails 1-5  bilaterally performed with a nail nipper.  Filed with dremel without incident. Porokeratosis have been debrided with # 15 blade.  Dr Samuella Cota was consulted and he recommended the ulcer site be bandaged with honey and a bandage.  Also heel cushion was prescribed.  Patient is to follow up with Dr. Samuella Cota in 3 weeks.  RTC 3 months for nail care.   Helane Gunther DPM   Return office visit  3 months                    Told patient to return for periodic foot care and evaluation due to potential at risk complications.   Helane Gunther DPM

## 2020-03-01 ENCOUNTER — Telehealth: Payer: Self-pay | Admitting: Podiatry

## 2020-03-01 NOTE — Telephone Encounter (Signed)
Home [facility] called stating she needs the new wound care orders. She said she can also accept them verbally. Call back #: [phone].  Please advise.

## 2020-03-11 NOTE — Telephone Encounter (Signed)
I have not seen this patient for this issue. Not sure if this was addressed can you follow up?

## 2020-03-19 ENCOUNTER — Ambulatory Visit: Payer: Medicare Other | Admitting: Podiatry

## 2020-03-19 ENCOUNTER — Other Ambulatory Visit: Payer: Self-pay

## 2020-03-19 DIAGNOSIS — L97421 Non-pressure chronic ulcer of left heel and midfoot limited to breakdown of skin: Secondary | ICD-10-CM | POA: Diagnosis not present

## 2020-03-19 MED ORDER — SANTYL 250 UNIT/GM EX OINT: TOPICAL_OINTMENT | CUTANEOUS | 0 refills | Status: DC

## 2020-03-19 NOTE — Progress Notes (Addendum)
  Subjective:  Patient ID: Bethany Blake, female    DOB: 12/22/1921,  MRN: [id number]  Chief Complaint  Patient presents with  . Foot Ulcer    Left posterior heel ulcer, denies drainage of blood or pus, denies fever/chills/nausea/vomiting.     86 y.o. female presents for wound care. Hx confirmed with patient.  Objective:  Physical Exam: Wound Location: left heel posterior Wound Measurement: 1.5x1 Wound Base: Fibrotic slough Peri-wound: Normal Exudate: Scant/small amount Serosanguinous exudate wound without warmth, erythema, signs of acute infection  Assessment:   1. Ulcer of left heel and midfoot, limited to breakdown of skin (HCC)      Plan:  Patient was evaluated and treated and all questions answered.  Ulcer left heel -Would recommend starting enzymatic debridement with Santyl. Rx sent. -Discussed offloading strategies, has had multipodus boots and cannot use them as patient will wear them at night when she gets up to urinate and this is a fall risk. -No debridement today -Dressed with medihoney and mepilex border dressing.  Return in about 3 weeks (around 04/09/2020) for Wound Care, Left.

## 2020-04-07 ENCOUNTER — Telehealth: Payer: Self-pay | Admitting: Podiatry

## 2020-04-07 NOTE — Telephone Encounter (Signed)
The home health nurse said that when she went to change the bandage there was a foul odor. The skin around the wound is macerated. She cleaned it up well and just wanted to make you aware. She said the patient isnt having any problems. If you need to reach her you can call her at [phone]

## 2020-04-13 ENCOUNTER — Ambulatory Visit: Payer: Medicare Other | Admitting: Podiatry

## 2020-04-20 ENCOUNTER — Ambulatory Visit (INDEPENDENT_AMBULATORY_CARE_PROVIDER_SITE_OTHER): Payer: Medicare Other | Admitting: Podiatry

## 2020-04-20 ENCOUNTER — Other Ambulatory Visit: Payer: Self-pay

## 2020-04-20 DIAGNOSIS — L97421 Non-pressure chronic ulcer of left heel and midfoot limited to breakdown of skin: Secondary | ICD-10-CM | POA: Diagnosis not present

## 2020-04-20 NOTE — Progress Notes (Signed)
  Subjective:  Patient ID: Bethany Blake, female    DOB: 1921-07-22,  MRN: [id number]  Chief Complaint  Patient presents with  . Wound Check    Heel of left foot. Pt. Daughter states there is a smell coming from pt. Foot.    87 y.o. female presents for wound care. Hx confirmed with patient.  Objective:  Physical Exam: Wound Location: left heel posterior Wound Measurement: 1.5x1 Wound Base: granular Peri-wound: Normal Exudate: Scant/small amount Serosanguinous exudate wound without warmth, erythema, signs of acute infection No malodor noted today  Assessment:   1. Ulcer of left heel and midfoot, limited to breakdown of skin (HCC)    Plan:  Patient was evaluated and treated and all questions answered.  Ulcer left heel -Wound cleansed -Continue santyl for enzymatic debridment -Similar in size to last visit however wound base much improved. -Continue offloading -No debridement today -Dressed with medihoney and mepilex border dressing.  No follow-ups on file.

## 2020-04-22 ENCOUNTER — Telehealth: Payer: Self-pay | Admitting: *Deleted

## 2020-04-22 NOTE — Telephone Encounter (Signed)
Anissa w/[facility] is requesting last office visit (04/20/20) notes for patient.  Faxed and confirmed  notes on 04/22/20 to [facility].

## 2020-05-14 ENCOUNTER — Ambulatory Visit (INDEPENDENT_AMBULATORY_CARE_PROVIDER_SITE_OTHER): Payer: Medicare Other | Admitting: Podiatry

## 2020-05-14 ENCOUNTER — Other Ambulatory Visit: Payer: Self-pay

## 2020-05-14 DIAGNOSIS — L97421 Non-pressure chronic ulcer of left heel and midfoot limited to breakdown of skin: Secondary | ICD-10-CM | POA: Diagnosis not present

## 2020-05-14 NOTE — Progress Notes (Signed)
  Subjective:  Patient ID: Bethany Blake, female    DOB: November 15, 1921,  MRN: [id number]  Chief Complaint  Patient presents with  . Foot Ulcer    Left plantar/posterior heel ulcer check. Pt guardian denies fever/chills/nausea/vomiting. Denies any malodor.    87 y.o. female presents for wound care. Hx confirmed with patient.  Objective:  Physical Exam: Wound Location: left heel posterior Wound Measurement: 0.8x0.8 Wound Base: granular Peri-wound: Normal Exudate: Scant/small amount Serosanguinous exudate wound without warmth, erythema, signs of acute infection No malodor noted today  Assessment:   1. Ulcer of left heel and midfoot, limited to breakdown of skin (HCC)    Plan:  Patient was evaluated and treated and all questions answered.  Ulcer left heel -Wound improving. Patient to continue current regiment until healed. -Dress with medihoney and mepilex border dressing.  No follow-ups on file.

## 2020-05-20 ENCOUNTER — Telehealth: Payer: Self-pay | Admitting: Podiatry

## 2020-05-20 NOTE — Telephone Encounter (Signed)
[facility] called and stated that this patient still has an opened wound and she is to be discharged today. If she still needs home health, patient will need another order.

## 2020-05-25 NOTE — Telephone Encounter (Signed)
Can we ask family how the wound is doing and if they feel comfortable doing the wound care or if they need more home health care?

## 2020-06-04 ENCOUNTER — Ambulatory Visit: Payer: Medicare Other | Admitting: Podiatry

## 2020-07-14 ENCOUNTER — Other Ambulatory Visit: Payer: Self-pay

## 2020-07-14 ENCOUNTER — Ambulatory Visit: Payer: Medicare Other | Admitting: Podiatry

## 2020-07-14 ENCOUNTER — Encounter: Payer: Self-pay | Admitting: Podiatry

## 2020-07-14 DIAGNOSIS — B351 Tinea unguium: Secondary | ICD-10-CM | POA: Diagnosis not present

## 2020-07-14 DIAGNOSIS — N179 Acute kidney failure, unspecified: Secondary | ICD-10-CM

## 2020-07-14 DIAGNOSIS — M79676 Pain in unspecified toe(s): Secondary | ICD-10-CM | POA: Diagnosis not present

## 2020-07-14 DIAGNOSIS — L97421 Non-pressure chronic ulcer of left heel and midfoot limited to breakdown of skin: Secondary | ICD-10-CM

## 2020-07-14 NOTE — Progress Notes (Signed)
This patient returns to my office for at risk foot care.  This patient requires this care by a professional since this patient will be at risk due to having kidney injury.    This patient is unable to cut nails herself since the patient cannot reach her nails.These nails are painful walking and wearing shoes.  Patient presents with her mother and says her ulcer has healed. This patient presents for at risk foot care today.  General Appearance  Alert, conversant and in no acute stress.  Vascular  Dorsalis pedis pulses are palpable  Bilaterally. Posterior tibial pulses are absent  B/L.  Capillary return is within normal limits  bilaterally. Cold feet  B/L.   Bilaterally.  Absent hair both feet.  Neurologic  Senn-Weinstein monofilament wire test within normal limits  bilaterally. Muscle power within normal limits bilaterally.  Nails Thick disfigured discolored nails with subungual debris  from hallux to fifth toes bilaterally. No evidence of bacterial infection or drainage bilaterally.  Orthopedic  No limitations of motion  feet .  No crepitus or effusions noted.  No bony pathology or digital deformities noted.Hammer toes  B/l.  Overlapping 3rd toe right foot.  Skin  normotropic skin with no porokeratosis noted bilaterally.  No signs of infections or ulcers noted.   Porokeratosis sub 2 left and sub 3 right asymptomatic.  Onychomycosis  Pain in right toes  Pain in left toes   Consent was obtained for treatment procedures.   Mechanical debridement of nails 1-5  bilaterally performed with a nail nipper.  Filed with dremel without incident.    Return office visit  3 months                    Told patient to return for periodic foot care and evaluation due to potential at risk complications.   Helane Gunther DPM

## 2020-10-15 ENCOUNTER — Other Ambulatory Visit: Payer: Self-pay

## 2020-10-15 ENCOUNTER — Encounter: Payer: Self-pay | Admitting: Podiatry

## 2020-10-15 ENCOUNTER — Ambulatory Visit: Payer: Medicare Other | Admitting: Podiatry

## 2020-10-15 DIAGNOSIS — N179 Acute kidney failure, unspecified: Secondary | ICD-10-CM | POA: Diagnosis not present

## 2020-10-15 DIAGNOSIS — B351 Tinea unguium: Secondary | ICD-10-CM

## 2020-10-15 DIAGNOSIS — M79676 Pain in unspecified toe(s): Secondary | ICD-10-CM | POA: Diagnosis not present

## 2020-10-15 NOTE — Progress Notes (Signed)
This patient returns to my office for at risk foot care.  This patient requires this care by a professional since this patient will be at risk due to having kidney injury.    This patient is unable to cut nails herself since the patient cannot reach her nails.These nails are painful walking and wearing shoes.  Patient presents with her mother and says her ulcer has healed. This patient presents for at risk foot care today.  General Appearance  Alert, conversant and in no acute stress.  Vascular  Dorsalis pedis pulses are palpable  Bilaterally. Posterior tibial pulses are absent  B/L.  Capillary return is within normal limits  bilaterally. Cold feet  B/L.   Bilaterally.  Absent hair both feet.  Neurologic  Senn-Weinstein monofilament wire test within normal limits  bilaterally. Muscle power within normal limits bilaterally.  Nails Thick disfigured discolored nails with subungual debris  from hallux to fifth toes bilaterally. No evidence of bacterial infection or drainage bilaterally.  Orthopedic  No limitations of motion  feet .  No crepitus or effusions noted.  No bony pathology or digital deformities noted.Hammer toes  B/l.  Overlapping 3rd toe right foot.  Skin  normotropic skin with no porokeratosis noted bilaterally.  No signs of infections or ulcers noted.   Porokeratosis sub 2 left and sub 3 right asymptomatic.  Onychomycosis  Pain in right toes  Pain in left toes   Consent was obtained for treatment procedures.   Mechanical debridement of nails 1-5  bilaterally performed with a nail nipper.  Filed with dremel without incident.    Return office visit  3 months                    Told patient to return for periodic foot care and evaluation due to potential at risk complications.   Helane Gunther DPM

## 2021-01-12 ENCOUNTER — Encounter: Payer: Self-pay | Admitting: Podiatry

## 2021-01-12 ENCOUNTER — Ambulatory Visit: Payer: Medicare Other | Admitting: Podiatry

## 2021-01-12 ENCOUNTER — Other Ambulatory Visit: Payer: Self-pay

## 2021-01-12 DIAGNOSIS — M79676 Pain in unspecified toe(s): Secondary | ICD-10-CM

## 2021-01-12 DIAGNOSIS — B351 Tinea unguium: Secondary | ICD-10-CM | POA: Diagnosis not present

## 2021-01-12 DIAGNOSIS — N179 Acute kidney failure, unspecified: Secondary | ICD-10-CM

## 2021-01-12 NOTE — Progress Notes (Signed)
This patient returns to my office for at risk foot care.  This patient requires this care by a professional since this patient will be at risk due to having kidney injury.    This patient is unable to cut nails herself since the patient cannot reach her nails.These nails are painful walking and wearing shoes.  Patient presents with her mother and says her ulcer has healed. This patient presents for at risk foot care today.  General Appearance  Alert, conversant and in no acute stress.  Vascular  Dorsalis pedis pulses are palpable  Bilaterally. Posterior tibial pulses are absent  B/L.  Capillary return is within normal limits  bilaterally. Cold feet  B/L.   Bilaterally.  Absent hair both feet.  Neurologic  Senn-Weinstein monofilament wire test within normal limits  bilaterally. Muscle power within normal limits bilaterally.  Nails Thick disfigured discolored nails with subungual debris  from hallux to fifth toes bilaterally. No evidence of bacterial infection or drainage bilaterally.  Orthopedic  No limitations of motion  feet .  No crepitus or effusions noted.  No bony pathology or digital deformities noted.Hammer toes  B/l.  Overlapping 3rd toe right foot.  Skin  normotropic skin with no porokeratosis noted bilaterally.  No signs of infections or ulcers noted.   Porokeratosis sub 2 left and sub 3 right asymptomatic.  Onychomycosis  Pain in right toes  Pain in left toes   Consent was obtained for treatment procedures.   Mechanical debridement of nails 1-5  bilaterally performed with a nail nipper.  Filed with dremel without incident.    Return office visit  3 months                    Told patient to return for periodic foot care and evaluation due to potential at risk complications.   Kassidee Narciso DPM  

## 2021-04-27 ENCOUNTER — Other Ambulatory Visit: Payer: Self-pay

## 2021-04-27 ENCOUNTER — Ambulatory Visit: Payer: Medicare Other | Admitting: Podiatry

## 2021-04-27 DIAGNOSIS — M79676 Pain in unspecified toe(s): Secondary | ICD-10-CM | POA: Diagnosis not present

## 2021-04-27 DIAGNOSIS — N179 Acute kidney failure, unspecified: Secondary | ICD-10-CM | POA: Diagnosis not present

## 2021-04-27 DIAGNOSIS — B351 Tinea unguium: Secondary | ICD-10-CM

## 2021-04-27 NOTE — Progress Notes (Signed)
This patient returns to my office for at risk foot care.  This patient requires this care by a professional since this patient will be at risk due to having kidney injury.    This patient is unable to cut nails herself since the patient cannot reach her nails.These nails are painful walking and wearing shoes.  Patient presents with her mother and says her ulcer has healed. This patient presents for at risk foot care today. ? ?General Appearance  Alert, conversant and in no acute stress. ? ?Vascular  Dorsalis pedis pulses are  weakly palpable  bilaterally. Posterior tibial pulses are absent  B/L.  Capillary return is within normal limits  bilaterally. Cold feet  B/L.   Bilaterally.  Absent hair both feet. ? ?Neurologic  Senn-Weinstein monofilament wire test within normal limits  bilaterally. Muscle power within normal limits bilaterally. ? ?Nails Thick disfigured discolored nails with subungual debris  from hallux to fifth toes bilaterally. No evidence of bacterial infection or drainage bilaterally. ? ?Orthopedic  No limitations of motion  feet .  No crepitus or effusions noted.  No bony pathology or digital deformities noted.Hammer toes  B/l.  Overlapping 3rd toe right foot. ? ?Skin  normotropic skin with no porokeratosis noted bilaterally.  No signs of infections or ulcers noted.   Porokeratosis sub 2 left and sub 3 right asymptomatic. ? ?Onychomycosis  Pain in right toes  Pain in left toes  ? ?Consent was obtained for treatment procedures.   Mechanical debridement of nails 1-5  bilaterally performed with a nail nipper.  Filed with dremel without incident.  ? ? ?Return office visit  3 months                    Told patient to return for periodic foot care and evaluation due to potential at risk complications. ? ? ?Eugenio Dollins DPM  ?

## 2021-06-19 ENCOUNTER — Encounter (HOSPITAL_COMMUNITY): Payer: Self-pay

## 2021-06-19 ENCOUNTER — Other Ambulatory Visit: Payer: Self-pay

## 2021-06-19 ENCOUNTER — Observation Stay (HOSPITAL_COMMUNITY)
Admission: EM | Admit: 2021-06-19 | Discharge: 2021-06-20 | Disposition: A | Discharge status: Home / Self Care | Payer: Medicare Other | Attending: Internal Medicine | Admitting: Internal Medicine

## 2021-06-19 ENCOUNTER — Emergency Department (HOSPITAL_COMMUNITY): Payer: Medicare Other

## 2021-06-19 DIAGNOSIS — R4189 Other symptoms and signs involving cognitive functions and awareness: Secondary | ICD-10-CM

## 2021-06-19 DIAGNOSIS — B9620 Unspecified Escherichia coli [E. coli] as the cause of diseases classified elsewhere: Secondary | ICD-10-CM | POA: Insufficient documentation

## 2021-06-19 DIAGNOSIS — E039 Hypothyroidism, unspecified: Secondary | ICD-10-CM | POA: Diagnosis not present

## 2021-06-19 DIAGNOSIS — S065XAA Traumatic subdural hemorrhage with loss of consciousness status unknown, initial encounter: Principal | ICD-10-CM | POA: Diagnosis present

## 2021-06-19 DIAGNOSIS — X58XXXA Exposure to other specified factors, initial encounter: Secondary | ICD-10-CM | POA: Insufficient documentation

## 2021-06-19 DIAGNOSIS — I10 Essential (primary) hypertension: Secondary | ICD-10-CM | POA: Diagnosis present

## 2021-06-19 DIAGNOSIS — R5381 Other malaise: Secondary | ICD-10-CM | POA: Diagnosis not present

## 2021-06-19 DIAGNOSIS — D649 Anemia, unspecified: Secondary | ICD-10-CM | POA: Diagnosis present

## 2021-06-19 DIAGNOSIS — N390 Urinary tract infection, site not specified: Secondary | ICD-10-CM | POA: Insufficient documentation

## 2021-06-19 DIAGNOSIS — Y9359 Activity, other involving other sports and athletics played individually: Secondary | ICD-10-CM | POA: Diagnosis not present

## 2021-06-19 DIAGNOSIS — R55 Syncope and collapse: Secondary | ICD-10-CM | POA: Diagnosis present

## 2021-06-19 DIAGNOSIS — Z87891 Personal history of nicotine dependence: Secondary | ICD-10-CM | POA: Diagnosis not present

## 2021-06-19 DIAGNOSIS — Z20822 Contact with and (suspected) exposure to covid-19: Secondary | ICD-10-CM | POA: Diagnosis not present

## 2021-06-19 DIAGNOSIS — R569 Unspecified convulsions: Secondary | ICD-10-CM

## 2021-06-19 DIAGNOSIS — Z79899 Other long term (current) drug therapy: Secondary | ICD-10-CM | POA: Insufficient documentation

## 2021-06-19 DIAGNOSIS — I1310 Hypertensive heart and chronic kidney disease without heart failure, with stage 1 through stage 4 chronic kidney disease, or unspecified chronic kidney disease: Secondary | ICD-10-CM | POA: Diagnosis not present

## 2021-06-19 DIAGNOSIS — N1831 Chronic kidney disease, stage 3a: Secondary | ICD-10-CM | POA: Diagnosis not present

## 2021-06-19 DIAGNOSIS — M069 Rheumatoid arthritis, unspecified: Secondary | ICD-10-CM | POA: Insufficient documentation

## 2021-06-19 LAB — CBC WITH DIFFERENTIAL/PLATELET
Abs Immature Granulocytes: 0.01 10*3/uL (ref 0.00–0.07)
Basophils Absolute: 0 10*3/uL (ref 0.0–0.1)
Basophils Relative: 0 %
Eosinophils Absolute: 0.1 10*3/uL (ref 0.0–0.5)
Eosinophils Relative: 2 %
HCT: 33.6 % — ABNORMAL LOW (ref 36.0–46.0)
Hemoglobin: 10.7 g/dL — ABNORMAL LOW (ref 12.0–15.0)
Immature Granulocytes: 0 %
Lymphocytes Relative: 24 %
Lymphs Abs: 1.5 10*3/uL (ref 0.7–4.0)
MCH: 27.5 pg (ref 26.0–34.0)
MCHC: 31.8 g/dL (ref 30.0–36.0)
MCV: 86.4 fL (ref 80.0–100.0)
Monocytes Absolute: 0.7 10*3/uL (ref 0.1–1.0)
Monocytes Relative: 11 %
Neutro Abs: 3.8 10*3/uL (ref 1.7–7.7)
Neutrophils Relative %: 63 %
Platelets: 245 10*3/uL (ref 150–400)
RBC: 3.89 MIL/uL (ref 3.87–5.11)
RDW: 15.7 % — ABNORMAL HIGH (ref 11.5–15.5)
WBC: 6.1 10*3/uL (ref 4.0–10.5)
nRBC: 0 % (ref 0.0–0.2)

## 2021-06-19 LAB — URINALYSIS, ROUTINE W REFLEX MICROSCOPIC
Bilirubin Urine: NEGATIVE
Glucose, UA: NEGATIVE mg/dL
Hgb urine dipstick: NEGATIVE
Ketones, ur: NEGATIVE mg/dL
Nitrite: NEGATIVE
Protein, ur: NEGATIVE mg/dL
Specific Gravity, Urine: 1.01 (ref 1.005–1.030)
pH: 6 (ref 5.0–8.0)

## 2021-06-19 LAB — COMPREHENSIVE METABOLIC PANEL
ALT: 11 U/L (ref 0–44)
AST: 18 U/L (ref 15–41)
Albumin: 3.8 g/dL (ref 3.5–5.0)
Alkaline Phosphatase: 52 U/L (ref 38–126)
Anion gap: 7 (ref 5–15)
BUN: 28 mg/dL — ABNORMAL HIGH (ref 8–23)
CO2: 27 mmol/L (ref 22–32)
Calcium: 9.1 mg/dL (ref 8.9–10.3)
Chloride: 102 mmol/L (ref 98–111)
Creatinine, Ser: 1.03 mg/dL — ABNORMAL HIGH (ref 0.44–1.00)
GFR, Estimated: 49 mL/min — ABNORMAL LOW (ref >60)
Glucose, Bld: 99 mg/dL (ref 70–99)
Potassium: 4.5 mmol/L (ref 3.5–5.1)
Sodium: 136 mmol/L (ref 135–145)
Total Bilirubin: 0.4 mg/dL (ref 0.3–1.2)
Total Protein: 6.7 g/dL (ref 6.5–8.1)

## 2021-06-19 LAB — URINALYSIS, MICROSCOPIC (REFLEX)

## 2021-06-19 LAB — PHOSPHORUS: Phosphorus: 4 mg/dL (ref 2.5–4.6)

## 2021-06-19 LAB — RESP PANEL BY RT-PCR (FLU A&B, COVID) ARPGX2
Influenza A by PCR: NEGATIVE
Influenza B by PCR: NEGATIVE
SARS Coronavirus 2 by RT PCR: NEGATIVE

## 2021-06-19 LAB — MAGNESIUM: Magnesium: 2.1 mg/dL (ref 1.7–2.4)

## 2021-06-19 LAB — LACTIC ACID, PLASMA
Lactic Acid, Venous: 1.9 mmol/L (ref 0.5–1.9)
Lactic Acid, Venous: 1.7 mmol/L (ref 0.5–1.9)

## 2021-06-19 LAB — CBG MONITORING, ED: Glucose-Capillary: 106 mg/dL — ABNORMAL HIGH (ref 70–99)

## 2021-06-19 MED ORDER — HYDRALAZINE HCL 50 MG PO TABS
50.0000 mg | ORAL_TABLET | Freq: Three times a day (TID) | ORAL | Status: DC
  Administered 2021-06-20 (×2): 50 mg via ORAL
  Filled 2021-06-19 (×2): qty 1

## 2021-06-19 MED ORDER — LACTATED RINGERS IV BOLUS
1000.0000 mL | Freq: Once | INTRAVENOUS | Status: AC
  Administered 2021-06-19: 1000 mL via INTRAVENOUS

## 2021-06-19 MED ORDER — LEVETIRACETAM 500 MG PO TABS
500.0000 mg | ORAL_TABLET | Freq: Two times a day (BID) | ORAL | Status: DC
  Administered 2021-06-19 – 2021-06-20 (×2): 500 mg via ORAL
  Filled 2021-06-19 (×2): qty 1

## 2021-06-19 MED ORDER — LEVOTHYROXINE SODIUM 112 MCG PO TABS
112.0000 ug | ORAL_TABLET | Freq: Every morning | ORAL | Status: DC
  Administered 2021-06-20: 112 ug via ORAL
  Filled 2021-06-19: qty 1

## 2021-06-19 MED ORDER — VITAMIN D 25 MCG (1000 UNIT) PO TABS
1000.0000 [IU] | ORAL_TABLET | Freq: Every day | ORAL | Status: DC
  Administered 2021-06-20: 1000 [IU] via ORAL
  Filled 2021-06-19: qty 1

## 2021-06-19 MED ORDER — CARVEDILOL 3.125 MG PO TABS
3.1250 mg | ORAL_TABLET | Freq: Two times a day (BID) | ORAL | Status: DC
Start: 2021-06-20 — End: 2021-06-20
  Administered 2021-06-20: 3.125 mg via ORAL
  Filled 2021-06-19: qty 1

## 2021-06-19 MED ORDER — SODIUM CHLORIDE 0.9% FLUSH
3.0000 mL | Freq: Two times a day (BID) | INTRAVENOUS | Status: DC
  Administered 2021-06-20: 3 mL via INTRAVENOUS

## 2021-06-19 MED ORDER — LEVETIRACETAM IN NACL 1000 MG/100ML IV SOLN
1000.0000 mg | Freq: Once | INTRAVENOUS | Status: AC
  Administered 2021-06-19: 1000 mg via INTRAVENOUS
  Filled 2021-06-19: qty 100

## 2021-06-19 NOTE — ED Provider Notes (Signed)
[facility]-EMERGENCY DEPT Provider Note   CSN: [id number] Arrival date & time: 06/19/21  1200     History  No chief complaint on file.   Bethany Blake is a 88 y.o. female.  HPI     88 year old female comes in with chief complaint of fainting spell, unresponsive spell.  She has history of subarachnoid hemorrhage, subdural hemorrhage, E. coli UTI.  Patient lives independently at independent living facility.  She is accompanied by her daughter, who provides majority of the history.  The daughter indicates that she was playing checkers with her mother when she had couple of spells of blank stare.  Each episodes lasted few seconds.  Patient then however became unresponsive.  EMS was called.  Patient did not lose consciousness.  She was noted to be hypotensive per EMS.  No recent trauma.  No illnesses and no fevers, chills, UTI-like symptoms.  Patient denies any abdominal pain, chest pain, shortness of breath, palpitations.  There is no known cardiac history.  Patient denies any change in her speech, focal numbness or weakness.  Home Medications Prior to Admission medications   Medication Sig Start Date End Date Taking? Authorizing Provider  acetaminophen (TYLENOL) 500 MG tablet Take 500 mg by mouth daily as needed (pain).   Yes [provider]  carvedilol (COREG) 3.125 MG tablet Take 1 tablet (3.125 mg total) by mouth 2 (two) times daily with a meal. 01/28/20  Yes Rodolph Bong, MD  cholecalciferol (VITAMIN D3) 25 MCG (1000 UNIT) tablet Take 1,000 Units by mouth daily with lunch.   Yes [provider]  hydrALAZINE (APRESOLINE) 50 MG tablet Take 1 tablet (50 mg total) by mouth 3 (three) times daily. Patient taking differently: Take 50 mg by mouth 3 (three) times daily with meals. 01/30/20  Yes Rodolph Bong, MD  levothyroxine (SYNTHROID) 112 MCG tablet Take 1 tablet (112 mcg total) by mouth daily at 6 (six) AM. Patient taking differently: Take  112 mcg by mouth every morning. 01/27/20  Yes Rodolph Bong, MD      Allergies    Ace inhibitors, Aldactone [spironolactone], Angiotensin receptor blockers, and Sulfa antibiotics    Review of Systems   Review of Systems  All other systems reviewed and are negative.  Physical Exam Updated Vital Signs BP 139/60    Pulse 69    Temp 97.6 F (36.4 C) (Oral)    Resp 15    Ht  (1.575 m)    Wt 47.2 kg    SpO2 98%    BMI 19.02 kg/m  Physical Exam Vitals and nursing note reviewed.  Constitutional:      Appearance: She is well-developed.  HENT:     Head: Atraumatic.  Cardiovascular:     Rate and Rhythm: Normal rate.  Pulmonary:     Effort: Pulmonary effort is normal.  Musculoskeletal:     Cervical back: Normal range of motion and neck supple.  Skin:    General: Skin is warm and dry.  Neurological:     Mental Status: She is alert and oriented to person, place, and time.    ED Results / Procedures / Treatments   Labs (all labs ordered are listed, but only abnormal results are displayed) Labs Reviewed  COMPREHENSIVE METABOLIC PANEL - Abnormal; Notable for the following components:      Result Value   BUN 28 (*)    Creatinine, Ser 1.03 (*)    GFR, Estimated 49 (*)  All other components within normal limits  CBC WITH DIFFERENTIAL/PLATELET - Abnormal; Notable for the following components:   Hemoglobin 10.7 (*)    HCT 33.6 (*)    RDW 15.7 (*)    All other components within normal limits  CBG MONITORING, ED - Abnormal; Notable for the following components:   Glucose-Capillary 106 (*)    All other components within normal limits  MAGNESIUM  PHOSPHORUS  LACTIC ACID, PLASMA  URINALYSIS, ROUTINE W REFLEX MICROSCOPIC  LACTIC ACID, PLASMA    EKG None  Radiology CT Head Wo Contrast  Addendum Date: 06/19/2021   ADDENDUM REPORT: 06/19/2021 14:59 ADDENDUM: The original report was by Dr. Gaylyn RongWalter Liebkemann. The following addendum is by Dr. Gaylyn RongWalter Liebkemann: Critical  Value/emergent results were called by telephone at the time of interpretation on 06/19/2021 at 2:49 pm to provider [facility] Nell[facility] Lio , who verbally acknowledged these results. Electronically Signed   By: Gaylyn RongWalter  Liebkemann M.D.   On: 06/19/2021 14:59   Result Date: 06/19/2021 CLINICAL DATA:  New onset seizure. Syncope. History of subdural hematomas. EXAM: CT HEAD WITHOUT CONTRAST TECHNIQUE: Contiguous axial images were obtained from the base of the skull through the vertex without intravenous contrast. RADIATION DOSE REDUCTION: This exam was performed according to the departmental dose-optimization program which includes automated exposure control, adjustment of the mA and/or kV according to patient size and/or use of iterative reconstruction technique. COMPARISON:  01/23/2020 FINDINGS: Brain: On images 10 through 6418 of series 4, there is a thin extra-axial fluid collection of intermediate density measuring about 0.2 cm in diameter adjacent to the left frontal lobe, compatible with a small subdural hematoma. The intermediate density suggests subacute chronicity. Periventricular white matter and corona radiata hypodensities favor chronic ischemic microvascular white matter disease. No acute CVA or mass lesion identified. Suspected remote lacunar infarct in the right external capsule for example image 13 series 2. 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 Atherosclerotic calcification of the cavernous intracranial carotid arteries. Skull: Unremarkable Sinuses/Orbits: Unremarkable Other: No supplemental non-categorized findings. IMPRESSION: 1. Small likely subacute left frontal subdural hematoma, only 0.2 cm in thickness, shown on image 9 series 2 and image 16 series 4. 2. 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 Periventricular white matter and corona radiata hypodensities favor chronic ischemic  microvascular white matter disease. 4. Small remote lacunar infarct in the right external capsule. Radiology assistant personnel have been notified to put me in telephone contact with the referring physician or the referring physician's clinical representative in order to discuss these findings. Once this communication is established I will issue an addendum to this report for documentation purposes. Electronically Signed: By: Gaylyn RongWalter  Liebkemann M.D. On: 06/19/2021 14:45    Procedures .Critical Care Performed by: Derwood KaplanNanavati, Tonilynn Bieker, MD Authorized by: Derwood KaplanNanavati, Amit Meloy, MD   Critical care provider statement:    Critical care time (minutes):  58   Critical care was necessary to treat or prevent imminent or life-threatening deterioration of the following conditions:  CNS failure or compromise   Critical care was time spent personally by me on the following activities:  Development of treatment plan with patient or surrogate, discussions with consultants, evaluation of patient's response to treatment, examination of patient, ordering and review of laboratory studies, ordering and review of radiographic studies, ordering and performing treatments and interventions, pulse oximetry, re-evaluation of patient's condition, review of old charts and obtaining history from patient or  surrogate    Medications Ordered in ED Medications  lactated ringers bolus 1,000 mL (1,000 mLs Intravenous New Bag/Given 06/19/21 1417)  levETIRAcetam (KEPPRA) IVPB 1000 mg/100 mL premix (1,000 mg Intravenous New Bag/Given 06/19/21 1523)    ED Course/ Medical Decision Making/ A&P                           Medical Decision Making Amount and/or Complexity of Data Reviewed Labs: ordered. Radiology: ordered.  Risk Prescription drug management. Decision regarding hospitalization.   This patient presents to the ED with chief complaint(s) of unresponsive spell and seizure like activity with pertinent past medical history of  subdural hematoma, subarachnoid hemorrhage, UTIs which further complicates the presenting complaint. The complaint involves an extensive differential diagnosis and treatment options and also carries with it a high risk of complications and morbidity.    The differential diagnosis includes : New absence seizure, new subdural hematoma, UTI, syncope, arrhythmia.  The initial plan is to order basic blood work-up, CT scan of the brain, EKG of the heart.  Patient has no focal neurodeficits.  At this time, MRI is not indicated.  Additional history obtained: Additional history obtained from family Records reviewed previous admission documents  Reassessment and review (also see workup area): Lab Tests: I Ordered, and personally interpreted labs.  The pertinent results include: CBC with normal white count, hemoglobin being at baseline normal at 10.7.  Patient has normal metabolic profile with mild evidence of dehydration based on BUN/creatinine ratio.  Lactic acid is normal.  Imaging Studies: I ordered and independently visualized and interpreted the following imaging CT scan of the brain   which showed evidence of chronic subdural hematoma.  Radiologist had then contacted me showing me new area of subdural hematoma. The interpretation of the imaging was limited to assessing for emergent pathology, for which purpose it was ordered.  Consultations Obtained: I requested consultation with the consultant -neurologist, Dr. Jerrell Belfast and neurosurgeon, Dr. Dutch Quint , and discussed  findings as well as pertinent plan - they recommend: Admission to [facility], EEG and that patient is not a candidate for neurosurgical intervention.  Most importantly, patient can stay at [facility] long and not be transferred to [facility].  Findings of the CT scan discussed with the patient and the family.  They are also electing no heroic measures.  CODE STATUS is DNR, DNI.  Medicines ordered and prescription drug management: Patient  given 1 g of Keppra per neurology recommendation.  They want patient to get finder milligrams twice daily of Keppra and EEG.   Reevaluation of the patient after these medicines showed that the patient    stayed the same   Cardiac Monitoring: The patient was maintained on a cardiac monitor.  I personally viewed and interpreted the cardiac monitor which showed an underlying rhythm of:  sinus rhythm  Complexity of problems addressed: Patients presentation is most consistent with  acute presentation with potential threat to life or bodily function During patient's assessment  Disposition: After consideration of the diagnostic results and the patients response to treatment,  I feel that the patent would benefit from admission   .    Final Clinical Impression(s) / ED Diagnoses Final diagnoses:  SDH (subdural hematoma)  Seizure-like activity (HCC)  Unresponsive episode    Rx / DC Orders ED Discharge Orders     None         Derwood Kaplan, MD 06/19/21 1607

## 2021-06-19 NOTE — ED Triage Notes (Signed)
Patient present with c/o syncope episode. Patient blood pressure per ems was 82/46 initial. Recent BP 130/100. Patient denies fall.  ?

## 2021-06-19 NOTE — H&P (Signed)
?History and Physical  ? ? ?PatientTHANH Blake [id number] DOB: Jul 14, 1921 ?DOA: 06/19/2021 ?DOS: the patient was seen and examined on 06/19/2021 ?PCP: [name], MD  ?Patient coming from: Home ? ?Chief Complaint: No chief complaint on file. ? ?HPI: Bethany Blake is a 88 y.o. 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 coli UTI, bilateral subdural hematomas, subarachnoid hemorrhage who was brought to the emergency department via EMS due to having a syncopal episode at home while she was playing dominoes with her daughter who noticed that she had a blank stare on her face twice and was not responding to verbal stimuli.  She was transiently hypotensive with EMS.  There was no chest pain, palpitations, diaphoresis, nausea, emesis, abdominal pain, recent flank pain, dysuria or hematuria.  There is no history of seizures. ? ?ED course: Initial vital signs were temperature 97.6 ?F, pulse 70, respiration 18, BP 148/54 mmHg O2 sat 99% on room air.  The patient received 1000 mL of LR bolus and 1000 mg of Keppra IVPB.  Dr. Kathrynn Humble discussed the case with Dr. Trenton Gammon from neuro surgery who stated that there was nothing to do from the neurosurgical standpoint.  Dr. Rory Percy from the neuro hospitalist team recommended Keppra and EEG. ? ?Lab work: Her urinalysis shows small leukocyte esterase with rare bacteria but was otherwise unremarkable.  Lactic acid x2 normal.  CBC*white count 6.1, hemoglobin 10.7 g/dL platelets 245.  Normal chemistry except for a BUN of 28 and creatinine of 1.03 mg/dL. ? ?Imaging: CT head without contrast shows mild likely subacute left frontal subdural hematoma about valve 0.2 cm in thickness.  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  Periventricular white matter disease and small remote  lacunar infarct.  Please see images and full radiology report for further details. ?  ?Review of Systems: As mentioned in the history of present illness. All other systems reviewed and are negative. ?Past Medical History:  ?Diagnosis Date  ? Anxiety   ? Arthritis   ? Chronic kidney disease   ? Hypertension   ? Hypokalemia   ? Hyponatremia   ? Hypothyroidism   ? ?Past Surgical History:  ?Procedure Laterality Date  ? SHOULDER SURGERY    ? WRIST SURGERY    ? ?Social History:  reports that she has quit smoking. She has never used smokeless tobacco. She reports that she does not drink alcohol and does not use drugs. ? ?Allergies  ?Allergen Reactions  ? Ace Inhibitors Other (See Comments)  ?  Severe hyponatremia  ? Aldactone [Spironolactone] Other (See Comments)  ?  Severe hyponatremia  ? Angiotensin Receptor Blockers Other (See Comments)  ?  Severe hyponatremia  ? Sulfa Antibiotics Rash  ? ? ?Family History  ?Problem Relation Age of Onset  ? Hypertension Other   ? Colon cancer Neg Hx   ? Colon polyps Neg Hx   ? Rectal cancer Neg Hx   ? Stomach cancer Neg Hx   ? ? ?Prior to Admission medications   ?Medication Sig Start Date End Date Taking? Authorizing Provider  ?acetaminophen (TYLENOL) 500 MG tablet Take 500 mg by mouth daily as needed (pain).   Yes [provider]  ?carvedilol (COREG) 3.125 MG tablet Take 1 tablet (3.125 mg total) by mouth 2 (two) times daily with a meal. 01/28/20  Yes Eugenie Filler,  MD  ?cholecalciferol (VITAMIN D3) 25 MCG (1000 UNIT) tablet Take 1,000 Units by mouth daily with lunch.   Yes [provider]  ?hydrALAZINE (APRESOLINE) 50 MG tablet Take 1 tablet (50 mg total) by mouth 3 (three) times daily. ?Patient taking differently: Take 50 mg by mouth 3 (three) times daily with meals. 01/30/20  Yes Eugenie Filler, MD  ?levothyroxine (SYNTHROID) 112 MCG tablet Take 1 tablet (112 mcg total) by mouth daily at 6 (six) AM. ?Patient taking differently: Take 112 mcg by mouth every  morning. 01/27/20  Yes Eugenie Filler, MD  ? ? ?Physical Exam: ?Vitals:  ? 06/19/21 1524 06/19/21 1600 06/19/21 1700 06/19/21 1716  ?BP:  (!) 147/67 127/67   ?Pulse:  73 81   ?Resp:  18 (!) 21   ?Temp:    97.7 ?F (36.5 ?C)  ?TempSrc:    Oral  ?SpO2:  98% 96%   ?Weight: 47.2 kg     ?Height: 5\' 2"  (1.575 m)     ? ?Physical Exam ?Vitals and nursing note reviewed.  ?Constitutional:   ?   Appearance: She is normal weight.  ?HENT:  ?   Head: Normocephalic.  ?   Mouth/Throat:  ?   Mouth: Mucous membranes are moist.  ?Eyes:  ?   General: No scleral icterus. ?   Pupils: Pupils are equal, round, and reactive to light.  ?Neck:  ?   Vascular: No JVD.  ?Cardiovascular:  ?   Rate and Rhythm: Normal rate and regular rhythm.  ?   Heart sounds: S1 normal and S2 normal.  ?Pulmonary:  ?   Effort: Pulmonary effort is normal.  ?   Breath sounds: Normal breath sounds.  ?Abdominal:  ?   General: There is no distension.  ?   Palpations: Abdomen is soft.  ?   Tenderness: There is no abdominal tenderness.  ?Musculoskeletal:  ?   Cervical back: Neck supple.  ?   Right lower leg: 1+ Edema present.  ?   Left lower leg: 1+ Edema present.  ?   Comments: Moderate generalized weakness.  ?Skin: ?   General: Skin is warm and dry.  ?Neurological:  ?   General: No focal deficit present.  ?   Mental Status: She is alert and oriented to person, place, and time.  ?   Comments: Mildly tremulous.  ?Psychiatric:     ?   Mood and Affect: Mood normal.     ?   Behavior: Behavior normal.  ? ? ?Data Reviewed: ? ?There are no new results to review at this time. ? ?Assessment and Plan: ?Principal Problem: ?  Syncope ?Observation/telemetry. ?Frequent neurochecks. ?Check EEG. ?Continue Keppra 500 mg p.o. twice daily. ?Check echocardiogram.   ?Check carotid Doppler. ? ?Active Problems: ?  Subdural hematoma ?No history of trauma. ?Neurosurgery recommends observation. ?Neurology recommended Keppra and EEG. ? ?  Hypothyroidism ?Continue levothyroxine 112 mcg p.o.  daily. ? ?  Essential hypertension ?Continue carvedilol 3.125 mg p.o. twice daily. ?Continue hydralazine 50 mg p.o. 3 times daily. ?Monitor blood pressure and heart rate. ? ?  Physical deconditioning ?Consider physical therapy evaluation. ? ?  Normocytic anemia ?Monitor monitor grandmotherly. ? ?  Stage 3a chronic kidney disease (CKD) ([place]) ?Monitor renal function electrolytes. ? ? ? Advance Care Planning:   Code Status: DNR  ? ?Consults:  ? ?Family Communication:  ? ?Severity of Illness: ?The appropriate patient status for this patient is OBSERVATION. Observation status is judged to be reasonable and necessary  in order to provide the required intensity of service to ensure the patient's safety. The patient's presenting symptoms, physical exam findings, and initial radiographic and laboratory data in the context of their medical condition is felt to place them at decreased risk for further clinical deterioration. Furthermore, it is anticipated that the patient will be medically stable for discharge from the hospital within 2 midnights of admission.  ? ?Author: ?Reubin Milan, MD ?06/19/2021 5:43 PM ? ?For on call review [email or web address].  ? ?This document was prepared using [employer] and may contain some unintended transcription errors. ?

## 2021-06-19 NOTE — Progress Notes (Signed)
On-call phone consult note ? ?Concern for patient with chronic subdurals and possible new subdural with episodes of staring and syncope. ?Strong suspicion for seizures given the subdurals ?Recommend getting an EEG, loading the patient with Keppra 1 g IV and starting patient on Keppra 500 twice daily. ?Obtain neurosurgical consultation ?Any abnormality on the EEG or further episodes of stereotypic nature-please call in for formal consultation. ?Plan was relayed to Dr. Rhunette CroftNanavati in the ER ? ?-- ?Bethany DikesAshish Ellinor Test, MD ?Neurologist ?[facility] ?Pager: [phone] ? ?

## 2021-06-20 ENCOUNTER — Observation Stay (HOSPITAL_BASED_OUTPATIENT_CLINIC_OR_DEPARTMENT_OTHER): Payer: Medicare Other

## 2021-06-20 ENCOUNTER — Observation Stay (HOSPITAL_COMMUNITY)
Admit: 2021-06-20 | Discharge: 2021-06-20 | Disposition: A | Discharge status: Home / Self Care | Payer: Medicare Other | Attending: Internal Medicine | Admitting: Internal Medicine

## 2021-06-20 DIAGNOSIS — R55 Syncope and collapse: Secondary | ICD-10-CM | POA: Diagnosis not present

## 2021-06-20 DIAGNOSIS — R4189 Other symptoms and signs involving cognitive functions and awareness: Secondary | ICD-10-CM

## 2021-06-20 DIAGNOSIS — N1831 Chronic kidney disease, stage 3a: Secondary | ICD-10-CM | POA: Diagnosis not present

## 2021-06-20 DIAGNOSIS — I10 Essential (primary) hypertension: Secondary | ICD-10-CM | POA: Diagnosis not present

## 2021-06-20 DIAGNOSIS — S065XAA Traumatic subdural hemorrhage with loss of consciousness status unknown, initial encounter: Secondary | ICD-10-CM | POA: Diagnosis not present

## 2021-06-20 DIAGNOSIS — R569 Unspecified convulsions: Secondary | ICD-10-CM | POA: Diagnosis not present

## 2021-06-20 LAB — ECHOCARDIOGRAM COMPLETE
AR max vel: 2.38 cm2
AV Peak grad: 6.1 mmHg
Ao pk vel: 1.23 m/s
Area-P 1/2: 4.12 cm2
Calc EF: 47.4 %
Height: 62 in
MV M vel: 4.79 m/s
MV Peak grad: 91.8 mmHg
P 1/2 time: 679 msec
S' Lateral: 2.6 cm
Single Plane A2C EF: 51.1 %
Single Plane A4C EF: 47.6 %
Weight: 2074.09 oz

## 2021-06-20 LAB — GLUCOSE, CAPILLARY: Glucose-Capillary: 85 mg/dL (ref 70–99)

## 2021-06-20 MED ORDER — LEVETIRACETAM 500 MG PO TABS: 500.0000 mg | ORAL_TABLET | Freq: Two times a day (BID) | ORAL | 0 refills | Status: DC

## 2021-06-20 NOTE — Procedures (Signed)
Patient Name: Bethany ClintonMae J Mcneece  ?MRN: [id number]  ?Epilepsy Attending: Charlsie QuestPriyanka O Lorren Rossetti  ?Referring Physician/Provider: Bobette Mortiz, David Manuel, MD ?Date: 06/20/2021  ?Duration: 22.32 mins ? ?Patient history: 88yo F with chronic subdurals and possible new subdural with episodes of staring and syncope. EEG to evaluate for seizure ? ?Level of alertness: Awake, asleep ? ?AEDs during EEG study: LEV ? ?Technical aspects: This EEG study was done with scalp electrodes positioned according to the 10-20 International system of electrode placement. Electrical activity was acquired at a sampling rate of 500Hz  and reviewed with a high frequency filter of 70Hz  and a low frequency filter of 1Hz . EEG data were recorded continuously and digitally stored.  ? ?Description: The posterior dominant rhythm consists of 8 Hz activity of moderate voltage (25-35 uV) seen predominantly in posterior head regions, symmetric and reactive to eye opening and eye closing. Sleep was characterized by vertex waves, sleep spindles (12 to 14 Hz), maximal frontocentral region. Hyperventilation and photic stimulation were not performed.    ? ?IMPRESSION: ?This study is within normal limits. No seizures or epileptiform discharges were seen throughout the recording. ? ?Charlsie QuestPriyanka O Yarianna Varble  ? ?

## 2021-06-20 NOTE — Evaluation (Signed)
Occupational Therapy Evaluation and Discharge ?Patient Details ?Name: Bethany Blake ?MRN: [id number] ?DOB: July 11, 1921 ?Today's Date: 06/20/2021 ? ? ?History of Present Illness ZEFFIE Blake is a 88 y.o. 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 coli UTI, bilateral subdural hematomas, subarachnoid hemorrhage who was brought to the emergency department via EMS 06/19/21 due to having a syncopal episode at home while she was playing dominoes with her daughter who noticed that she had a blank stare on her face twice and was not responding to verbal stimuli.  She was transiently hypotensive with EMS.  There is no history of seizures.CT head without contrast shows mild likely subacute left frontal subdural hematoma  ? ?Clinical Impression ?  ?This 88 yo female admitted with above presents to acute OT not back to her baseline as of yet from a self care standpoint but dtr does not feel pt would benefit from therapy services to try and increase her strength and balance which would help with her self care areas as well. Dtr is there daily and PCA is there daily as well--but not 24/7 (which would be best for pt at present). Pt to D/C home today so will D/C from acute OT.  ?   ? ?Recommendations for follow up therapy are one component of a multi-disciplinary discharge planning process, led by the attending physician.  Recommendations may be updated based on patient status, additional functional criteria and insurance authorization.  ? ?Follow Up Recommendations ? No OT follow up (dtr politely declines)  ?  ?   ?Patient can return home with the following A little help with walking and/or transfers;A little help with bathing/dressing/bathroom;Assist for transportation;Assistance with cooking/housework;Direct supervision/assist for financial management;Direct supervision/assist for  medications management ? ?  ?Functional Status Assessment ? Patient has had a recent decline in their functional status and demonstrates the ability to make significant improvements in function in a reasonable and predictable amount of time. (but dtr feels skilled therapy services will not help her)  ?Equipment Recommendations ? None recommended by OT  ?  ?   ?Precautions / Restrictions Precautions ?Precautions: Fall ?Restrictions ?Weight Bearing Restrictions: No ?Other Position/Activity Restrictions: has not had a fall in > 1 yr  ? ?  ? ?Mobility Bed Mobility ?Overal bed mobility: Needs Assistance ?Bed Mobility: Supine to Sit ?  ?  ?Supine to sit: Min guard, HOB elevated (used rail) ?  ?  ?  ?  ? ?Transfers ?Overall transfer level: Needs assistance ?Equipment used: Rolling walker (2 wheels) ?Transfers: Sit to/from Stand ?Sit to Stand: Min guard ?  ?  ?  ?  ?  ?  ?  ? ?  ?Balance Overall balance assessment: Needs assistance ?Sitting-balance support: No upper extremity supported, Feet supported ?Sitting balance-Leahy Scale: Fair ?  ?  ?Standing balance support: Bilateral upper extremity supported, Reliant on assistive device for balance ?Standing balance-Leahy Scale: Poor ?  ?  ?  ?  ?  ?  ?  ?  ?  ?  ?  ?  ?   ? ?ADL either performed or assessed with clinical judgement  ? ?ADL Overall ADL's : Needs assistance/impaired ?Eating/Feeding: Set up;Sitting ?  ?Grooming: Set up;Supervision/safety;Sitting ?Grooming Details (indicate cue type and reason): hands, face, oral care ?Upper Body Bathing: Set up;Supervision/ safety;Sitting ?Upper Body Bathing Details (indicate cue type and reason): with only A for back ?Lower Body Bathing: Min guard;Sit to/from  stand ?  ?Upper Body Dressing : Minimal assistance;Sitting ?  ?Lower Body Dressing: Min guard;Sit to/from stand ?  ?Toilet Transfer: Minimal assistance;Ambulation;Rolling walker (2 wheels) ?Toilet Transfer Details (indicate cue type and reason): simulated bed to W/C outside  her hospital room door ?Toileting- ArchitectClothing Manipulation and Hygiene: Min guard;Sit to/from stand ?  ?  ?  ?  ?General ADL Comments: dtr reports pt can dress by herself, but dtr usually helps her. [facility] so she would not have to go all the way into the bathroom--per dtr they had tried this before and did not work but may be worth trying again.  ? ? ? ?Vision Patient Visual Report: No change from baseline ?   ?   ?   ?   ? ?Pertinent Vitals/Pain Pain Assessment ?Pain Assessment: No/denies pain  ? ? ? ?Hand Dominance Right ?  ?Extremity/Trunk Assessment Upper Extremity Assessment ?Upper Extremity Assessment: RUE deficits/detail ?RUE Deficits / Details: bad shoulder with decreased A/PROM ?RUE Coordination: decreased gross motor ?  ?  ?  ?  ?  ?Communication Communication ?Communication: HOH ?  ?Cognition Arousal/Alertness: Awake/alert ?Behavior During Therapy: [facility] for tasks assessed/performed ?Overall Cognitive Status: Within Functional Limits for tasks assessed ?  ?  ?  ?  ?  ?  ?  ?  ?  ?  ?  ?  ?  ?  ?  ?  ?  ?  ?  ?   ?   ?   ? ? ?Home Living Family/patient expects to be discharged to:: Assisted living ?  ?  ?  ?  ?  ?  ?  ?  ?  ?  ?  ?  ?  ?  ?Home Equipment: Rollator (4 wheels);Shower seat ?  ?Additional Comments[facility]. Has assistance for  meals and meds  9-1 then 4-6, daughter in and out, does not have 24/7 ?  ? ?  ?Prior Functioning/Environment Prior Level of Function : Independent/Modified Independent ?  ?  ?  ?  ?  ?  ?Mobility Comments: ambulates with rollator, is alone most of the day and night ?ADLs Comments: has assistance with bath and dressing(from dtr), toileting independently(per dtr does not get herself clean after a bowel movement) ?  ? ?  ?  ?OT Problem List: Decreased range of motion;Impaired balance (sitting and/or standing) ?  ?   ?   ?OT Goals(Current goals can be found in the care plan section) Acute  Rehab OT Goals ?Patient Stated Goal: to go home today  ?OT Frequency:   ?  ? ?   ?AM-PAC OT "6 Clicks" Daily Activity     ?Outcome Measure Help from another person eating meals?: A Little ?Help from another person taking care of personal grooming?: A Little ?Help from another person toileting, which includes using toliet, bedpan, or urinal?: A Little ?Help from another person bathing (including washing, rinsing, drying)?: A Little ?Help from another person to put on and taking off regular upper body clothing?: A Lot ?Help from another person to put on and taking off regular lower body clothing?: A Lot ?6 Click Score: 16 ?  ?End of Session Equipment Utilized During Treatment: Rolling walker (2 wheels) ? ?Activity Tolerance: Patient tolerated treatment well ?Patient left:  (in W/C getting ready to leave) ? ?OT Visit Diagnosis: Unsteadiness on feet (R26.81);Other abnormalities of gait and mobility (R26.89);Muscle weakness (generalized) (M62.81)  ?              ?  Time: 1610-9604 ?OT Time Calculation (min): 21 min ?Charges:  OT General Charges ?$OT Visit: 1 Visit ?OT Evaluation ?$OT Eval Moderate Complexity: 1 Mod ? ?Ignacia Palma, OTR/L ?Acute Rehab Services ?Pager [phone] ?Office [phone] ? ? ? ?Evette Georges ?06/20/2021, 3:33 PM ?

## 2021-06-20 NOTE — TOC Transition Note (Signed)
Transition of Care (TOC) - CM/SW Discharge Note ? ? ?Patient Details  ?Name: Zekia Grahek Schoch ?MRN: [id number] ?Date of Birth: [birth year] ? ?Transition of Care [facility]) CM/SW Contact:  ?Dessa Phi, RN ?Phone Number: ?06/20/2021, 12:40 PM ? ? ?Clinical Narrative:   Spoke to tr Margarita Grizzle about d/c plans-informed of recc HHC services-pleasantly declines HHC. No further CM needs. ? ? ? ?Final next level of care: Home/Self Care ?Barriers to Discharge: No Barriers Identified ? ? ?Patient Goals and CMS Choice ?Patient states their goals for this hospitalization and ongoing recovery are:: home ?CMS Medicare.gov Compare Post Acute Care list provided to:: Patient Represenative (must comment) Margarita Grizzle dtr [phone]) ?Choice offered to / list presented to : Adult Children ? ?Discharge Placement ?  ?           ?  ?  ?  ?  ? ?Discharge Plan and Services ?  ?Discharge Planning Services: CM Consult ?Post Acute Care Choice: Home Health (refused [place] services)          ?  ?  ?  ?  ?  ?HH Arranged: Patient Refused HH ?  ?  ?  ?  ? ?Social Determinants of Health (SDOH) Interventions ?  ? ? ?Readmission Risk Interventions ?No flowsheet data found. ? ? ? ? ?

## 2021-06-20 NOTE — Progress Notes (Signed)
EEG complete - results pending 

## 2021-06-20 NOTE — Evaluation (Signed)
Physical Therapy Evaluation ?Patient Details ?Name: Bethany Blake ?MRN: [id number] ?DOB: January 18, 1922 ?Today's Date: 06/20/2021 ? ?History of Present Illness ? Bethany Blake is a 88 y.o. 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 coli UTI, bilateral subdural hematomas, subarachnoid hemorrhage who was brought to the emergency department via EMS 06/19/21 due to having a syncopal episode at home while she was playing dominoes with her daughter who noticed that she had a blank stare on her face twice and was not responding to verbal stimuli.  She was transiently hypotensive with EMS.  There is no history of seizures.CT head without contrast shows mild likely subacute left frontal subdural hematoma  ?Clinical Impression ? The patient  received resting in bed, daughter present to provide information. Patient  lives alone in independent living, is ambulatory with a rollator, has caregivers several hours /day for mels and medication.   Daughter unsure of coverage  at night when patient returns home. ? BP supine 112/45,  sitting  with HOB raised 99/45, sitting on bed edge 132/59, after ambulating 20'-122/59. No reported dizziness. Patient  currently will benefit from 24/7 assistnace for safety. ?Pt admitted with above diagnosis.  Pt currently with functional limitations due to the deficits listed below (see PT Problem List). Pt will benefit from skilled PT to increase their independence and safety with mobility to allow discharge to the venue listed below.   ? ?   ? ?Recommendations for follow up therapy are one component of a multi-disciplinary discharge planning process, led by the attending physician.  Recommendations may be updated based on patient status, additional functional criteria and insurance authorization. ? ?Follow Up Recommendations Home health PT ? ?  ?Assistance  Recommended at Discharge Set up Supervision/Assistance  ?Patient can return home with the following ? A little help with walking and/or transfers;A little help with bathing/dressing/bathroom;Help with stairs or ramp for entrance;Assistance with cooking/housework;Assist for transportation ? ?  ?Equipment Recommendations None recommended by PT  ?Recommendations for Other Services ?    ?  ?Functional Status Assessment Patient has had a recent decline in their functional status and demonstrates the ability to make significant improvements in function in a reasonable and predictable amount of time.  ? ?  ?Precautions / Restrictions Precautions ?Precautions: Fall ?Precaution Comments: monitor BP. ?Restrictions ?Other Position/Activity Restrictions: has not had a fall in > 1 yr  ? ?  ? ?Mobility ? Bed Mobility ?Overal bed mobility: Needs Assistance ?Bed Mobility: Supine to Sit, Sit to Supine ?  ?  ?Supine to sit: Min guard ?Sit to supine: Min assist ?  ?General bed mobility comments: assist with lgs back onto bed, daughter reports that patient's mattress is on the floor, no f bed frame. ?  ? ?Transfers ?Overall transfer level: Needs assistance ?Equipment used: Rolling walker (2 wheels) ?Transfers: Sit to/from Stand ?Sit to Stand: Min assist ?  ?  ?  ?  ?  ?General transfer comment: steady asistnace to rise ?  ? ?Ambulation/Gait ?Ambulation/Gait assistance: Min guard ?Gait Distance (Feet): 20 Feet ?Assistive device: Rolling walker (2 wheels) ?Gait Pattern/deviations: Step-to pattern, Step-through pattern ?Gait velocity: decr ?  ?  ?General Gait Details: steady asisstanc e for balance , close guard ? ?Stairs ?  ?  ?  ?  ?  ? ?Wheelchair Mobility ?  ? ?Modified Rankin (Stroke Patients Only) ?  ? ?  ? ?Balance   ?  ?  ?  ?  ?  ?  ?  ?  ?  ?  ?  ?  ?  ?  ?  ?  ?  ?  ?   ? ? ? ?  Pertinent Vitals/Pain Pain Assessment ?Pain Assessment: No/denies pain  ? ? ?Home Living   ?  ?  ?  ?  ?  ?  ?  ?  ?  ?Additional Comments: lives at  [facility] living. Has assistance for  meals and meds  9-1 then 4-63, daughter in and out, does not have 24/7  ?  ?Prior Function Prior Level of Function : Independent/Modified Independent ?  ?  ?  ?  ?  ?  ?Mobility Comments: ambukates with rollator, is alone  most of the day and night ?ADLs Comments: has assistnace with bath and dressing, toileting independently ?  ? ? ?Hand Dominance  ?   ? ?  ?Extremity/Trunk Assessment  ? Upper Extremity Assessment ?Upper Extremity Assessment:  (limited shoulder  elevation, right worse thatn left- bad TSA.outcome) ?  ? ?Lower Extremity Assessment ?Lower Extremity Assessment: Generalized weakness ?  ? ?Cervical / Trunk Assessment ?Cervical / Trunk Assessment: Kyphotic  ?Communication  ? Communication: HOH  ?Cognition Arousal/Alertness: Awake/alert ?Behavior During Therapy: [facility] for tasks assessed/performed ?Overall Cognitive Status: Within Functional Limits for tasks assessed ?  ?  ?  ?  ?  ?  ?  ?  ?  ?  ?  ?  ?  ?  ?  ?  ?  ?  ?  ? ?  ?General Comments   ? ?  ?Exercises    ? ?Assessment/Plan  ?  ?PT Assessment Patient needs continued PT services  ?PT Problem List Decreased strength;Decreased mobility;Decreased activity tolerance;Decreased knowledge of use of DME;Obesity ? ?   ?  ?PT Treatment Interventions DME instruction;Therapeutic activities;Gait training;Therapeutic exercise;Patient/family education;Functional mobility training   ? ?PT Goals (Current goals can be found in the Care Plan section)  ?Acute Rehab PT Goals ?Patient Stated Goal: to return home ?PT Goal Formulation: With patient/family ?Time For Goal Achievement: 07/04/21 ?Potential to Achieve Goals: Good ? ?  ?Frequency Min 3X/week ?  ? ? ?Co-evaluation   ?  ?  ?  ?  ? ? ?  ?AM-PAC PT "6 Clicks" Mobility  ?Outcome Measure Help needed turning from your back to your side while in a flat bed without using bedrails?: A Little ?Help needed moving from lying on your back to sitting on the side of a  flat bed without using bedrails?: A Little ?Help needed moving to and from a bed to a chair (including a wheelchair)?: A Little ?Help needed standing up from a chair using your arms (e.g., wheelchair or bedside chair)?: A Little ?Help needed to walk in hospital room?: A Lot ?Help needed climbing 3-5 steps with a railing? : A Lot ?6 Click Score: 16 ? ?  ?End of Session   ?Activity Tolerance: Patient tolerated treatment well;Patient limited by fatigue ?Patient left: in bed;with call bell/phone within reach;with family/visitor present;with bed alarm set ?Nurse Communication: Mobility status ?PT Visit Diagnosis: Unsteadiness on feet (R26.81);Difficulty in walking, not elsewhere classified (R26.2) ?  ? ?Time: 6720-9470 ?PT Time Calculation (min) (ACUTE ONLY): 30 min ? ? ?Charges:   PT Evaluation ?$PT Eval Low Complexity: 1 Low ?PT Treatments ?$Gait Training: 8-22 mins ?  ?   ? ? ?Blanchard Kelch PT ?Acute Rehabilitation Services ?Pager [phone] ?Office [phone] ? ?Dinnis Rog, Jobe Igo ?06/20/2021, 11:33 AM ? ?

## 2021-06-20 NOTE — Care Management Obs Status (Signed)
MEDICARE OBSERVATION STATUS NOTIFICATION ? ? ?Patient Details  ?Name: Bethany Blake ?MRN: [id number] ?Date of Birth: [birth year] ? ? ?Medicare Observation Status Notification Given:  Yes ? ? ? ?Lanier Clam, RN ?06/20/2021, 12:38 PM ?

## 2021-06-20 NOTE — Progress Notes (Signed)
Carotid artery duplex has been completed. ?Preliminary results can be found in CV Proc through chart review.  ? ?06/20/21 9:57 AM ?Olen Cordial RVT   ?

## 2021-06-20 NOTE — Discharge Summary (Signed)
Physician Discharge Summary  Bethany Blake ATF:[id number] DOB: [birth year] DOA: 06/19/2021  PCP: [name], MD  Admit date: 06/19/2021 Discharge date: 06/20/2021  Admitted From: Home Disposition: Home  Recommendations for Outpatient Follow-up:  Follow up with PCP in 1 week  Outpatient follow-up with neurology  follow up in ED if symptoms worsen or new appear   Home Health: PT/OT Equipment/Devices: None  Discharge Condition: Stable CODE STATUS: Full Diet recommendation: Heart healthy  Brief/Interim Summary:  88 y.o. 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 coli UTI, bilateral subdural hematomas, subarachnoid hemorrhage presented with syncopal episode with concern for possible seizure.  On presentation, CT of the head without contrast:  mild likely subacute left frontal subdural hematoma about valve 0.2 cm in thickness.  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  Periventricular white matter disease and small remote lacunar infarct.  Neurosurgery was contacted by ED provider who recommended conservative management with numbness surgical intervention needed.  Neurology recommended to start Keppra, get EEG and discharge patient on Keppra twice a day.  EEG was negative for seizures.  2D echo and carotid ultrasound were done.  Patient will be discharged home today with home health PT/OT with outpatient follow-up with PCP and neurology.  Discharge diagnosis  Syncope Possible seizure Subdural hematoma -Patient presented with syncopal episode with concern for possible seizure -Neurosurgery was contacted by ED provider who recommended conservative management with numbness surgical intervention needed.  Neurology recommended to start Keppra, get EEG and discharge patient on Keppra twice a day.  EEG was  negative for seizures.  2D echo showed EF of 55-60% with Grade I diastolic dysfunction. -carotid ultrasound showed no significant carotid stenosis -PT recommended HHPT. -Patient will be discharged home today with home health PT/OT with outpatient follow-up with PCP and neurology.  Essential hypertension -Continue Coreg and hydralazine.  Outpatient follow-up  Hypothyroidism -Continue levothyroxine  Chronic kidney disease stage IIIa -Creatinine stable on presentation.  Outpatient follow-up  Anemia of chronic disease -Possibly from kidney disease.  Hemoglobin stable.  Discharge Instructions   Allergies as of 06/20/2021       Reactions   Ace Inhibitors Other (See Comments)   Severe hyponatremia   Aldactone [spironolactone] Other (See Comments)   Severe hyponatremia   Angiotensin Receptor Blockers Other (See Comments)   Severe hyponatremia   Sulfa Antibiotics Rash        Medication List     TAKE these medications    acetaminophen 500 MG tablet Commonly known as: TYLENOL Take 500 mg by mouth daily as needed (pain).   carvedilol 3.125 MG tablet Commonly known as: COREG Take 1 tablet (3.125 mg total) by mouth 2 (two) times daily with a meal.   cholecalciferol 25 MCG (1000 UNIT) tablet Commonly known as: VITAMIN D3 Take 1,000 Units by mouth daily with lunch.   hydrALAZINE 50 MG tablet Commonly known as: APRESOLINE Take 1 tablet (50 mg total) by mouth 3 (three) times daily. What changed: when to take this   levETIRAcetam 500 MG tablet Commonly known as: KEPPRA Take 1 tablet (500 mg total) by mouth 2 (two) times daily.   levothyroxine 112 MCG tablet Commonly known as: SYNTHROID Take 1 tablet (112 mcg total) by mouth daily at 6 (six) AM. What changed: when to take this         Follow-up Information  Bethany Baton, MD. Schedule an appointment as soon as possible for a visit in 1 week(s).   Specialty: Internal Medicine Contact information: [place] [id number] [phone]                Allergies  Allergen Reactions   Ace Inhibitors Other (See Comments)    Severe hyponatremia   Aldactone [Spironolactone] Other (See Comments)    Severe hyponatremia   Angiotensin Receptor Blockers Other (See Comments)    Severe hyponatremia   Sulfa Antibiotics Rash    Consultations: Neurosurgery and neurology reviewed the chart   Procedures/Studies: CT Head Wo Contrast  Addendum Date: 06/19/2021   ADDENDUM REPORT: 06/19/2021 14:59 ADDENDUM: The original report was by Dr. Van Clines. The following addendum is by Dr. Van Clines: Critical Value/emergent results were called by telephone at the time of interpretation on 06/19/2021 at 2:49 pm to provider [facility] , who verbally acknowledged these results. Electronically Signed   By: Van Clines M.D.   On: 06/19/2021 14:59   Result Date: 06/19/2021 CLINICAL DATA:  New onset seizure. Syncope. History of subdural hematomas. EXAM: CT HEAD WITHOUT CONTRAST TECHNIQUE: Contiguous axial images were obtained from the base of the skull through the vertex without intravenous contrast. RADIATION DOSE REDUCTION: This exam was performed according to the departmental dose-optimization program which includes automated exposure control, adjustment of the mA and/or kV according to patient size and/or use of iterative reconstruction technique. COMPARISON:  01/23/2020 FINDINGS: Brain: On images 10 through 37 of series 4, there is a thin extra-axial fluid collection of intermediate density measuring about 0.2 cm in diameter adjacent to the left frontal lobe, compatible with a small subdural hematoma. The intermediate density suggests subacute chronicity. Periventricular white matter and corona radiata hypodensities favor chronic ischemic microvascular white matter disease. No acute CVA or mass lesion identified. Suspected remote lacunar infarct in the right external capsule for  example image 13 series 2. 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 Atherosclerotic calcification of the cavernous intracranial carotid arteries. Skull: Unremarkable Sinuses/Orbits: Unremarkable Other: No supplemental non-categorized findings. IMPRESSION: 1. Small likely subacute left frontal subdural hematoma, only 0.2 cm in thickness, shown on image 9 series 2 and image 16 series 4. 2. 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 Periventricular white matter and corona radiata hypodensities favor chronic ischemic microvascular white matter disease. 4. Small remote lacunar infarct in the right external capsule. Radiology assistant personnel have been notified to put me in telephone contact with the referring physician or the referring physician's clinical representative in order to discuss these findings. Once this communication is established I will issue an addendum to this report for documentation purposes. Electronically Signed: By: Van Clines M.D. On: 06/19/2021 14:45      Subjective: Patient seen and examined at bedside.  Hard of hearing.  Poor historian.  Daughter present at bedside.  No overnight fever, vomiting, seizures reported.  Discharge Exam: Vitals:   06/20/21 0229 06/20/21 0537  BP: 138/74 (!) 165/64  Pulse: 72 65  Resp: 14 14  Temp: 98.3 F (36.8 C) 98.3 F (36.8 C)  SpO2: 96% 95%    General: Awake, slow to respond, poor historian.  No seizures or abnormal body movements noted Cardiovascular: rate controlled, S1/S2 + Respiratory: bilateral decreased breath sounds at bases Abdominal: Soft, NT, ND, bowel sounds + Extremities: Trace lower extremity edema; no cyanosis  The results of significant diagnostics from this hospitalization (including imaging, microbiology, ancillary and laboratory) are listed below for reference.     Microbiology: Recent  Results (from the past 240 hour(s))  Resp Panel by RT-PCR (Flu A&B, Covid) Nasopharyngeal Swab     Status: None   Collection Time: 06/19/21  4:26 PM   Specimen: Nasopharyngeal Swab; Nasopharyngeal(NP) swabs in vial transport medium  Result Value Ref Range Status   SARS Coronavirus 2 by RT PCR NEGATIVE NEGATIVE Final    Comment: (NOTE) SARS-CoV-2 target nucleic acids are NOT DETECTED.  The SARS-CoV-2 RNA is generally detectable in upper respiratory specimens during the acute phase of infection. The lowest concentration of SARS-CoV-2 viral copies this assay can detect is 138 copies/mL. A negative result does not preclude SARS-Cov-2 infection and should not be used as the sole basis for treatment or other patient management decisions. A negative result may occur with  improper specimen collection/handling, submission of specimen other than nasopharyngeal swab, presence of viral mutation(s) within the areas targeted by this assay, and inadequate number of viral copies(<138 copies/mL). A negative result must be combined with clinical observations, patient history, and epidemiological information. The expected result is Negative.  Fact Sheet for Patients:  EntrepreneurPulse.com.au  Fact Sheet for Healthcare Providers:  IncredibleEmployment.be  This test is no t yet approved or cleared by the [place] FDA and  has been authorized for detection and/or diagnosis of SARS-CoV-2 by FDA under an Emergency Use Authorization (EUA). This EUA will remain  in effect (meaning this test can be used) for the duration of the COVID-19 declaration under Section 564(b)(1) of the Act, 21 U.S.C.section 360bbb-3(b)(1), unless the authorization is terminated  or revoked sooner.       Influenza A by PCR NEGATIVE NEGATIVE Final   Influenza B by PCR NEGATIVE NEGATIVE Final    Comment: (NOTE) The Xpert Xpress SARS-CoV-2/FLU/RSV plus assay is intended as an aid in the  diagnosis of influenza from Nasopharyngeal swab specimens and should not be used as a sole basis for treatment. Nasal washings and aspirates are unacceptable for Xpert Xpress SARS-CoV-2/FLU/RSV testing.  Fact Sheet for Patients: EntrepreneurPulse.com.au  Fact Sheet for Healthcare Providers: IncredibleEmployment.be  This test is not yet approved or cleared by the [place] FDA and has been authorized for detection and/or diagnosis of SARS-CoV-2 by FDA under an Emergency Use Authorization (EUA). This EUA will remain in effect (meaning this test can be used) for the duration of the COVID-19 declaration under Section 564(b)(1) of the Act, 21 U.S.C. section 360bbb-3(b)(1), unless the authorization is terminated or revoked.  Performed at [facility], [place] [address], [place], [place] [address]      Labs: BNP (last 3 results) No results for input(s): BNP in the last 8760 hours. Basic Metabolic Panel: Recent Labs  Lab 06/19/21 1417  NA 136  K 4.5  CL 102  CO2 27  GLUCOSE 99  BUN 28*  CREATININE 1.03*  CALCIUM 9.1  MG 2.1  PHOS 4.0   Liver Function Tests: Recent Labs  Lab 06/19/21 1417  AST 18  ALT 11  ALKPHOS 52  BILITOT 0.4  PROT 6.7  ALBUMIN 3.8   No results for input(s): LIPASE, AMYLASE in the last 168 hours. No results for input(s): AMMONIA in the last 168 hours. CBC: Recent Labs  Lab 06/19/21 1417  WBC 6.1  NEUTROABS 3.8  HGB 10.7*  HCT 33.6*  MCV 86.4  PLT 245   Cardiac Enzymes: No results for  input(s): CKTOTAL, CKMB, CKMBINDEX, TROPONINI in the last 168 hours. BNP: Invalid input(s): POCBNP CBG: Recent Labs  Lab 06/19/21 1210  GLUCAP 106*   D-Dimer No results for input(s): DDIMER in the last 72 hours. Hgb A1c No results for input(s): HGBA1C in the last 72 hours. Lipid Profile No results for input(s): CHOL, HDL, LDLCALC, TRIG, CHOLHDL, LDLDIRECT in the last 72 hours. Thyroid  function studies No results for input(s): TSH, T4TOTAL, T3FREE, THYROIDAB in the last 72 hours.  Invalid input(s): FREET3 Anemia work up No results for input(s): VITAMINB12, FOLATE, FERRITIN, TIBC, IRON, RETICCTPCT in the last 72 hours. Urinalysis    Component Value Date/Time   COLORURINE YELLOW 06/19/2021 1417   APPEARANCEUR CLEAR 06/19/2021 1417   LABSPEC 1.010 06/19/2021 1417   PHURINE 6.0 06/19/2021 1417   GLUCOSEU NEGATIVE 06/19/2021 1417   HGBUR NEGATIVE 06/19/2021 1417   BILIRUBINUR NEGATIVE 06/19/2021 1417   KETONESUR NEGATIVE 06/19/2021 1417   PROTEINUR NEGATIVE 06/19/2021 1417   UROBILINOGEN 0.2 08/28/2014 0227   NITRITE NEGATIVE 06/19/2021 1417   LEUKOCYTESUR SMALL (A) 06/19/2021 1417   Sepsis Labs Invalid input(s): PROCALCITONIN,  WBC,  LACTICIDVEN Microbiology Recent Results (from the past 240 hour(s))  Resp Panel by RT-PCR (Flu A&B, Covid) Nasopharyngeal Swab     Status: None   Collection Time: 06/19/21  4:26 PM   Specimen: Nasopharyngeal Swab; Nasopharyngeal(NP) swabs in vial transport medium  Result Value Ref Range Status   SARS Coronavirus 2 by RT PCR NEGATIVE NEGATIVE Final    Comment: (NOTE) SARS-CoV-2 target nucleic acids are NOT DETECTED.  The SARS-CoV-2 RNA is generally detectable in upper respiratory specimens during the acute phase of infection. The lowest concentration of SARS-CoV-2 viral copies this assay can detect is 138 copies/mL. A negative result does not preclude SARS-Cov-2 infection and should not be used as the sole basis for treatment or other patient management decisions. A negative result may occur with  improper specimen collection/handling, submission of specimen other than nasopharyngeal swab, presence of viral mutation(s) within the areas targeted by this assay, and inadequate number of viral copies(<138 copies/mL). A negative result must be combined with clinical observations, patient history, and epidemiological information. The  expected result is Negative.  Fact Sheet for Patients:  EntrepreneurPulse.com.au  Fact Sheet for Healthcare Providers:  IncredibleEmployment.be  This test is no t yet approved or cleared by the [place] FDA and  has been authorized for detection and/or diagnosis of SARS-CoV-2 by FDA under an Emergency Use Authorization (EUA). This EUA will remain  in effect (meaning this test can be used) for the duration of the COVID-19 declaration under Section 564(b)(1) of the Act, 21 U.S.C.section 360bbb-3(b)(1), unless the authorization is terminated  or revoked sooner.       Influenza A by PCR NEGATIVE NEGATIVE Final   Influenza B by PCR NEGATIVE NEGATIVE Final    Comment: (NOTE) The Xpert Xpress SARS-CoV-2/FLU/RSV plus assay is intended as an aid in the diagnosis of influenza from Nasopharyngeal swab specimens and should not be used as a sole basis for treatment. Nasal washings and aspirates are unacceptable for Xpert Xpress SARS-CoV-2/FLU/RSV testing.  Fact Sheet for Patients: EntrepreneurPulse.com.au  Fact Sheet for Healthcare Providers: IncredibleEmployment.be  This test is not yet approved or cleared by the [place] FDA and has been authorized for detection and/or diagnosis of SARS-CoV-2 by FDA under an Emergency Use Authorization (EUA). This EUA will remain in effect (meaning this test can be used) for the duration of the COVID-19 declaration under Section  564(b)(1) of the Act, 21 U.S.C. section 360bbb-3(b)(1), unless the authorization is terminated or revoked.  Performed at [facility], [place] [address], [place], [place] [address]      Time coordinating discharge: 35 minutes  SIGNED:   Aline August, MD  [facility] 06/20/2021, 9:40 AM

## 2021-06-29 ENCOUNTER — Ambulatory Visit: Payer: Medicare Other | Admitting: Neurology

## 2021-07-27 ENCOUNTER — Ambulatory Visit: Payer: Medicare Other | Admitting: Podiatry

## 2021-07-27 ENCOUNTER — Encounter: Payer: Self-pay | Admitting: Podiatry

## 2021-07-27 DIAGNOSIS — N179 Acute kidney failure, unspecified: Secondary | ICD-10-CM | POA: Diagnosis not present

## 2021-07-27 DIAGNOSIS — M79676 Pain in unspecified toe(s): Secondary | ICD-10-CM

## 2021-07-27 DIAGNOSIS — B351 Tinea unguium: Secondary | ICD-10-CM | POA: Diagnosis not present

## 2021-07-27 NOTE — Progress Notes (Signed)
This patient returns to my office for at risk foot care.  This patient requires this care by a professional since this patient will be at risk due to having kidney injury.    This patient is unable to cut nails herself since the patient cannot reach her nails.These nails are painful walking and wearing shoes.  Patient presents with her mother and says her ulcer has healed. This patient presents for at risk foot care today. ? ?General Appearance  Alert, conversant and in no acute stress. ? ?Vascular  Dorsalis pedis pulses are  weakly palpable  bilaterally. Posterior tibial pulses are absent  B/L.  Capillary return is within normal limits  bilaterally. Cold feet  B/L.   Bilaterally.  Absent hair both feet. ? ?Neurologic  Senn-Weinstein monofilament wire test within normal limits  bilaterally. Muscle power within normal limits bilaterally. ? ?Nails Thick disfigured discolored nails with subungual debris  from hallux to fifth toes bilaterally. No evidence of bacterial infection or drainage bilaterally. ? ?Orthopedic  No limitations of motion  feet .  No crepitus or effusions noted.  No bony pathology or digital deformities noted.Hammer toes  B/l.  Overlapping 3rd toe right foot. ? ?Skin  normotropic skin with no porokeratosis noted bilaterally.  No signs of infections or ulcers noted.   Porokeratosis sub 2 left and sub 3 right asymptomatic. ? ?Onychomycosis  Pain in right toes  Pain in left toes  ? ?Consent was obtained for treatment procedures.   Mechanical debridement of nails 1-5  bilaterally performed with a nail nipper.  Filed with dremel without incident.  ? ? ?Return office visit  3 months                    Told patient to return for periodic foot care and evaluation due to potential at risk complications. ? ? ?Helane Gunther DPM  ?

## 2021-08-11 IMAGING — CT CT ABD-PELV W/O CM
2 of 4 series · 16 of 46 positions shown, 18 images · non-contrast
Comparison: 02/06/2007

CLINICAL DATA: Abdominal trauma.  Patient unable to void.

EXAM:
CT ABDOMEN AND PELVIS WITHOUT CONTRAST
TECHNIQUE: Multidetector CT imaging of the abdomen and pelvis was performed
following the standard protocol without IV contrast.

[Series 3: axial st · axial · 0.64mm/px · z∈[-726,-400]mm · 13 of 73 slices shown, 15 images]
[im 4/73  soft-tissue]
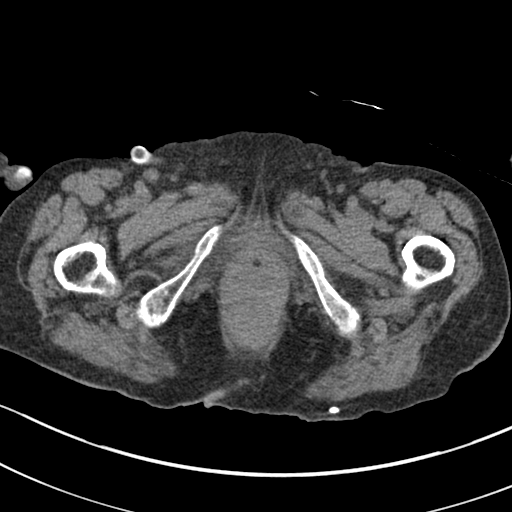
[im 4/73  bone]
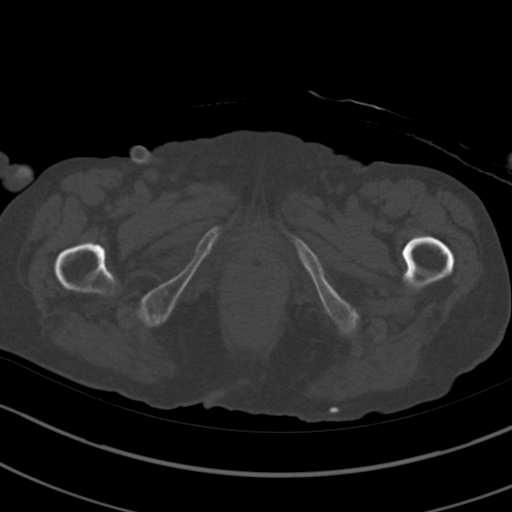
[im 11/73  soft-tissue]
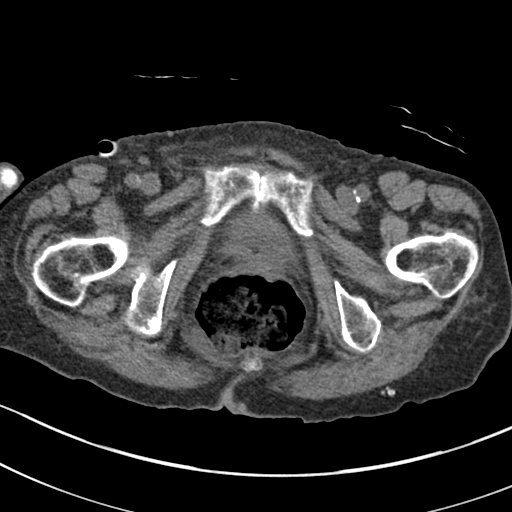
[im 15/73  soft-tissue]
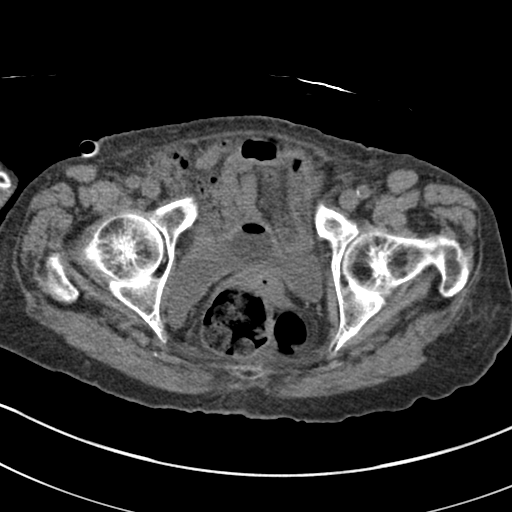
[im 22/73  soft-tissue]
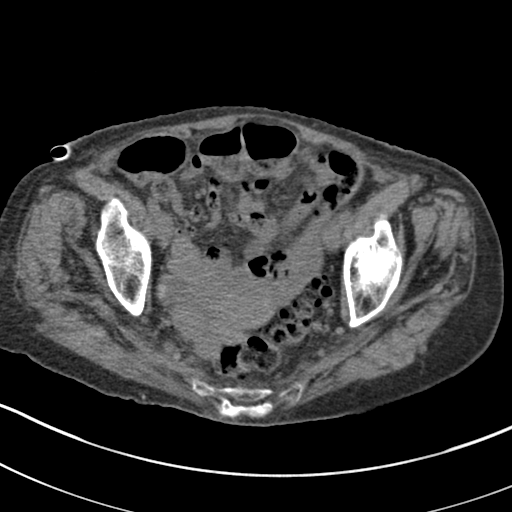
[im 26/73  soft-tissue]
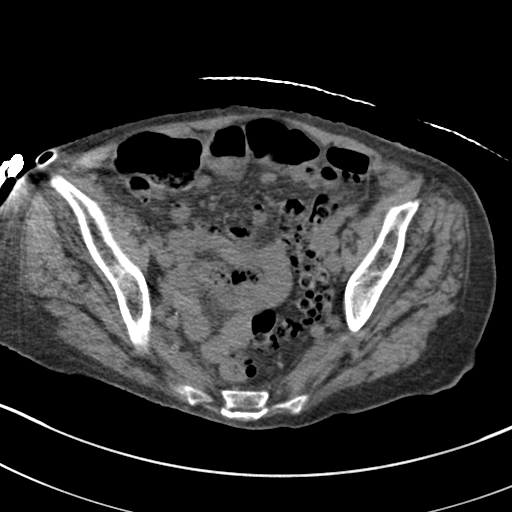
[im 33/73  soft-tissue]
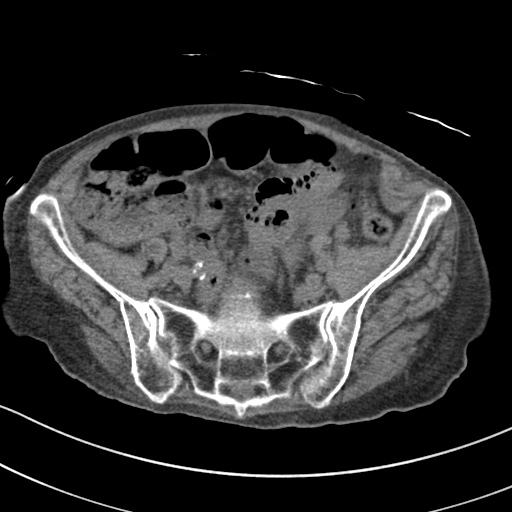
[im 37/73  soft-tissue]
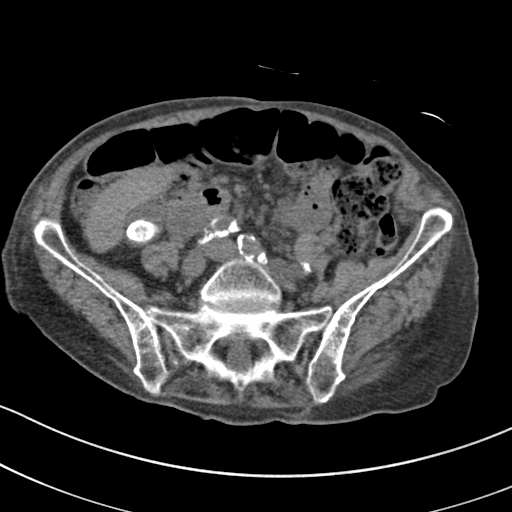
[im 40/73  soft-tissue]
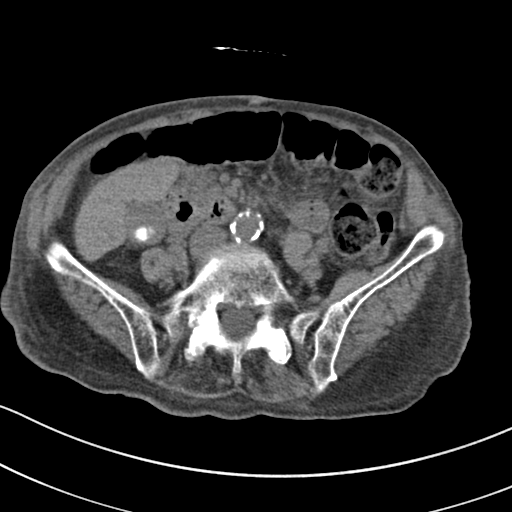
[im 47/73  soft-tissue]
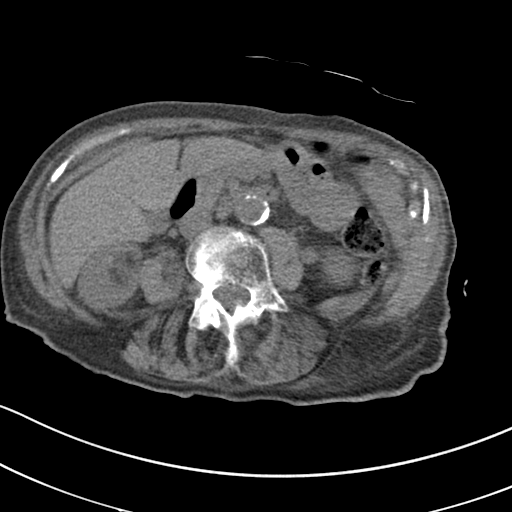
[im 47/73  bone]
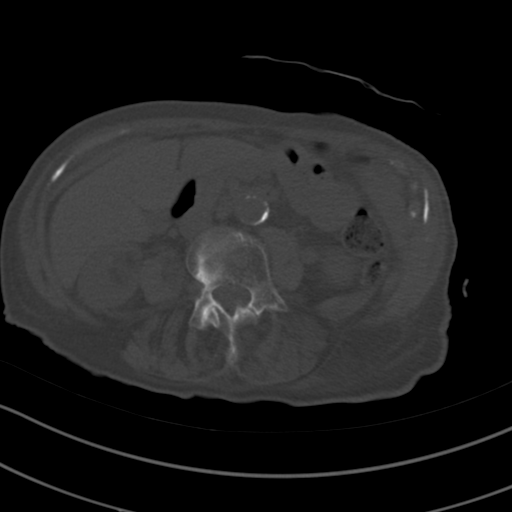
[im 51/73  soft-tissue]
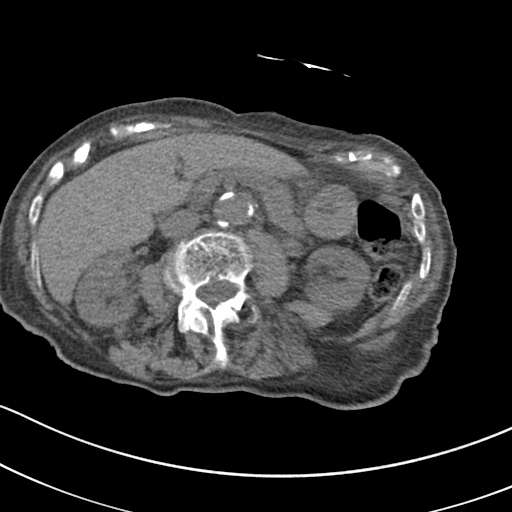
[im 58/73  soft-tissue]
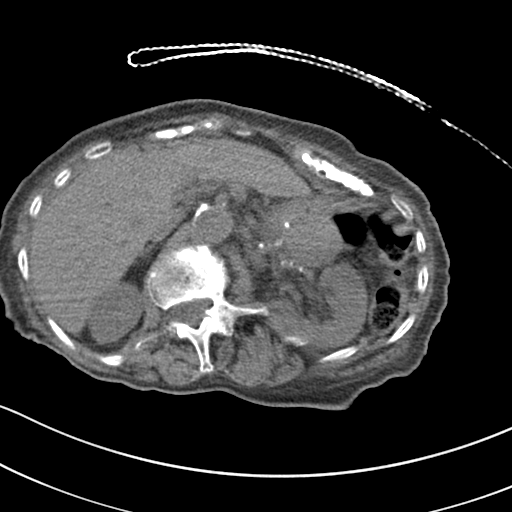
[im 62/73  soft-tissue]
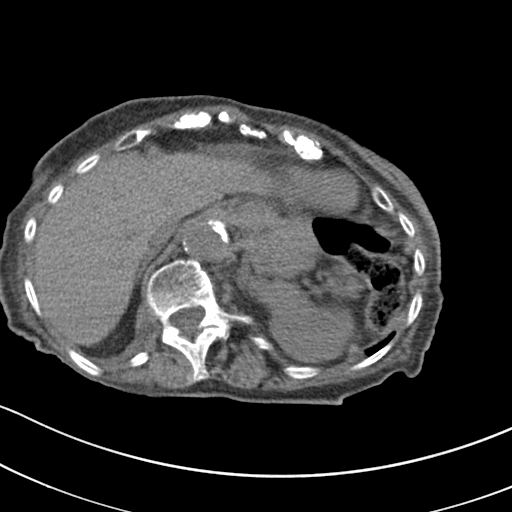
[im 69/73  soft-tissue]
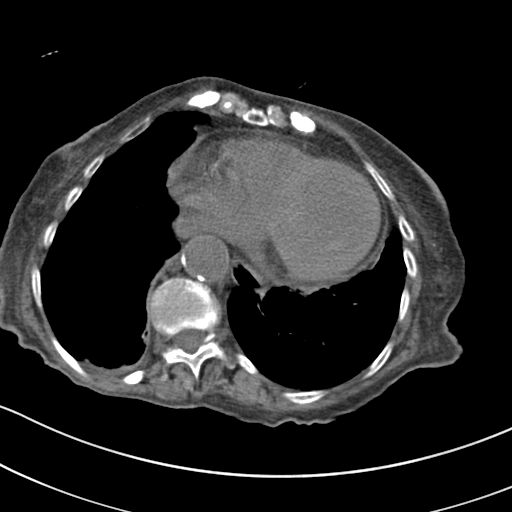

[Series 6: coronal st · coronal · 0.65mm/px · 3 of 108 slices shown]
[im 36/108  soft-tissue]
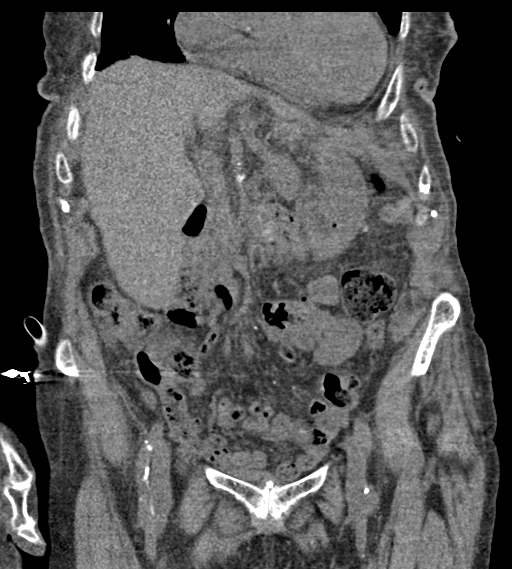
[im 48/108  soft-tissue]
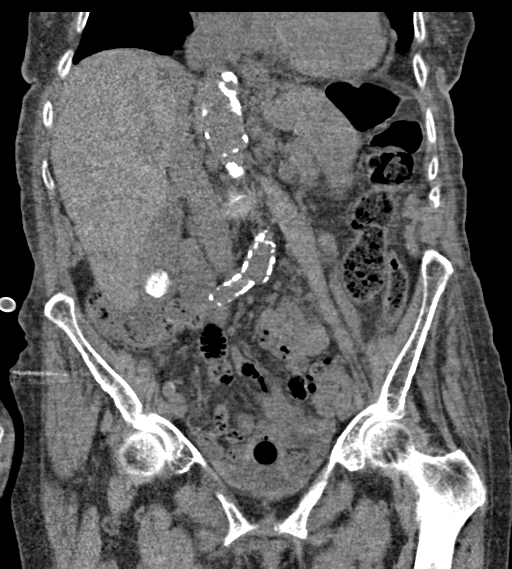
[im 60/108  soft-tissue]
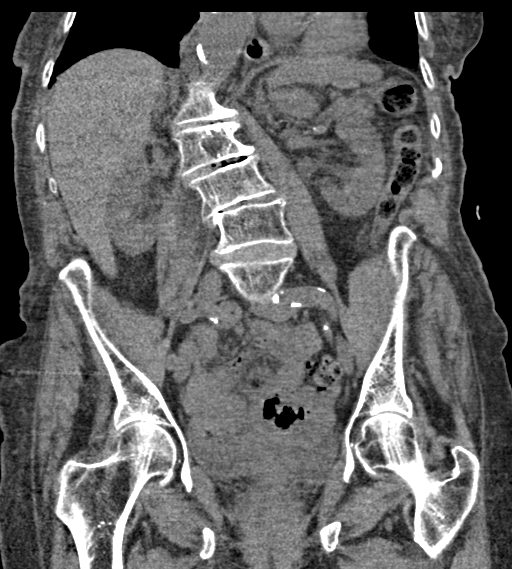

[16 of 46 positions shown; findings below may reference images not displayed]

FINDINGS: Lower chest: Mild cardiac enlargement but no pericardial effusion.
Aortic and coronary artery calcifications are noted. Bibasilar
scarring type change just but no infiltrates or effusions. No
pneumothorax. The lower ribs appear intact.

Hepatobiliary: No obvious hepatic lesion without contrast. The
gallbladder contains calcified gallstones. The largest measures 2
cm. No CT findings suspicious for acute cholecystitis. No common
bile duct dilatation.

Pancreas: No mass, inflammation or ductal dilatation.

Spleen: Normal size.  No focal lesions.

Adrenals/Urinary Tract: The adrenal glands and kidneys are grossly
normal without contrast. Small calcifications are noted in the right
renal hilum which could be vascular. No worrisome renal lesions or
hydroureteronephrosis. No obstructing ureteral calculi are
identified. There is a Foley catheter in a decompressed bladder.

Stomach/Bowel: The stomach, duodenum, small bowel and colon are
grossly normal without oral contrast. No obvious acute inflammatory
process, mass lesion or obstructive findings. The terminal ileum is
normal. The appendix is normal. There is diffuse colonic
diverticulosis most severe in the sigmoid region but no definite CT
findings for acute diverticulitis.

Vascular/Lymphatic: Advanced vascular calcifications but no definite
aneurysm. No mesenteric or retroperitoneal mass or adenopathy.

Reproductive: The uterus and ovaries are grossly normal.

Other: No pelvic mass or adenopathy. No free pelvic fluid
collections. No inguinal mass or adenopathy. No abdominal wall
hernia or subcutaneous lesions.

Musculoskeletal: The lumbar vertebral bodies are maintained. No
acute fracture. The pubic symphysis and SI joints are intact. Both
hips are normally located. No significant degenerative changes.

There are nondisplaced fractures involving the inferior pubic ramus
on the right side. I suspect there is also a subtle nondisplaced
fracture involving the right superior pubic ramus on the coronal
reformatted images. Remote healed left superior and inferior
inferior pubic ramus fracture noted.
IMPRESSION: 1. Nondisplaced right inferior and superior pubic rami fractures.
2. No acute abdominal/pelvic findings, mass lesion or adenopathy.
3. Cholelithiasis.
4. Advanced vascular calcifications.
5. Aortic atherosclerosis.

Aortic Atherosclerosis ([id number]).

## 2021-12-07 ENCOUNTER — Ambulatory Visit: Payer: Medicare Other | Admitting: Podiatry

## 2021-12-07 ENCOUNTER — Encounter: Payer: Self-pay | Admitting: Podiatry

## 2021-12-07 DIAGNOSIS — B351 Tinea unguium: Secondary | ICD-10-CM

## 2021-12-07 DIAGNOSIS — M79676 Pain in unspecified toe(s): Secondary | ICD-10-CM | POA: Diagnosis not present

## 2021-12-07 DIAGNOSIS — N179 Acute kidney failure, unspecified: Secondary | ICD-10-CM

## 2021-12-07 NOTE — Progress Notes (Addendum)
This patient returns to my office for at risk foot care.  This patient requires this care by a professional since this patient will be at risk due to having kidney injury.    This patient is unable to cut nails herself since the patient cannot reach her nails.These nails are painful walking and wearing shoes.   This patient presents for at risk foot care today.  General Appearance  Alert, conversant and in no acute stress.  Vascular  Dorsalis pedis pulses are  weakly palpable  bilaterally. Posterior tibial pulses are absent  B/L.  Capillary return is within normal limits  bilaterally. Cold feet  B/L.   Bilaterally.  Absent hair both feet.  Neurologic  Senn-Weinstein monofilament wire test within normal limits  bilaterally. Muscle power within normal limits bilaterally.  Nails Thick disfigured discolored nails with subungual debris  from hallux to fifth toes bilaterally. No evidence of bacterial infection or drainage bilaterally.  Orthopedic  No limitations of motion  feet .  No crepitus or effusions noted.  No bony pathology or digital deformities noted.Hammer toes  B/l.  Overlapping 3rd toe right foot.  Skin  normotropic skin with no porokeratosis noted bilaterally.  No signs of infections or ulcers noted.   Porokeratosis sub 2 left and sub 3 right asymptomatic.  Onychomycosis  Pain in right toes  Pain in left toes   Consent was obtained for treatment procedures.   Mechanical debridement of nails 1-5  bilaterally performed with a nail nipper.  Filed with dremel without incident.    Return office visit  3 months                    Told patient to return for periodic foot care and evaluation due to potential at risk complications.   Gardiner Barefoot DPM

## 2022-01-12 ENCOUNTER — Encounter: Payer: Self-pay | Admitting: Podiatry

## 2022-01-14 ENCOUNTER — Emergency Department (HOSPITAL_COMMUNITY): Payer: Medicare Other

## 2022-01-14 ENCOUNTER — Inpatient Hospital Stay (HOSPITAL_COMMUNITY)
Admission: EM | Admit: 2022-01-14 | Discharge: 2022-01-25 | DRG: 177 | Disposition: A | Discharge status: Skilled Nursing Facility | Payer: Medicare Other | Source: Skilled Nursing Facility | Attending: Internal Medicine | Admitting: Internal Medicine

## 2022-01-14 ENCOUNTER — Other Ambulatory Visit: Payer: Self-pay

## 2022-01-14 ENCOUNTER — Encounter (HOSPITAL_COMMUNITY): Payer: Self-pay

## 2022-01-14 DIAGNOSIS — Y92009 Unspecified place in unspecified non-institutional (private) residence as the place of occurrence of the external cause: Secondary | ICD-10-CM | POA: Diagnosis not present

## 2022-01-14 DIAGNOSIS — A0839 Other viral enteritis: Secondary | ICD-10-CM | POA: Diagnosis present

## 2022-01-14 DIAGNOSIS — R54 Age-related physical debility: Secondary | ICD-10-CM | POA: Diagnosis present

## 2022-01-14 DIAGNOSIS — E0781 Sick-euthyroid syndrome: Secondary | ICD-10-CM | POA: Diagnosis present

## 2022-01-14 DIAGNOSIS — Z7189 Other specified counseling: Secondary | ICD-10-CM | POA: Diagnosis not present

## 2022-01-14 DIAGNOSIS — E860 Dehydration: Secondary | ICD-10-CM | POA: Diagnosis present

## 2022-01-14 DIAGNOSIS — E440 Moderate protein-calorie malnutrition: Secondary | ICD-10-CM | POA: Diagnosis present

## 2022-01-14 DIAGNOSIS — I10 Essential (primary) hypertension: Secondary | ICD-10-CM | POA: Diagnosis not present

## 2022-01-14 DIAGNOSIS — D631 Anemia in chronic kidney disease: Secondary | ICD-10-CM | POA: Diagnosis present

## 2022-01-14 DIAGNOSIS — E785 Hyperlipidemia, unspecified: Secondary | ICD-10-CM | POA: Diagnosis present

## 2022-01-14 DIAGNOSIS — E8809 Other disorders of plasma-protein metabolism, not elsewhere classified: Secondary | ICD-10-CM | POA: Diagnosis present

## 2022-01-14 DIAGNOSIS — Z66 Do not resuscitate: Secondary | ICD-10-CM | POA: Diagnosis present

## 2022-01-14 DIAGNOSIS — R627 Adult failure to thrive: Secondary | ICD-10-CM | POA: Diagnosis present

## 2022-01-14 DIAGNOSIS — E875 Hyperkalemia: Secondary | ICD-10-CM | POA: Diagnosis present

## 2022-01-14 DIAGNOSIS — Z515 Encounter for palliative care: Secondary | ICD-10-CM | POA: Diagnosis not present

## 2022-01-14 DIAGNOSIS — I129 Hypertensive chronic kidney disease with stage 1 through stage 4 chronic kidney disease, or unspecified chronic kidney disease: Secondary | ICD-10-CM | POA: Diagnosis present

## 2022-01-14 DIAGNOSIS — Z681 Body mass index (BMI) 19 or less, adult: Secondary | ICD-10-CM

## 2022-01-14 DIAGNOSIS — Z87891 Personal history of nicotine dependence: Secondary | ICD-10-CM

## 2022-01-14 DIAGNOSIS — E222 Syndrome of inappropriate secretion of antidiuretic hormone: Secondary | ICD-10-CM | POA: Diagnosis present

## 2022-01-14 DIAGNOSIS — G9341 Metabolic encephalopathy: Secondary | ICD-10-CM | POA: Diagnosis present

## 2022-01-14 DIAGNOSIS — Z888 Allergy status to other drugs, medicaments and biological substances status: Secondary | ICD-10-CM

## 2022-01-14 DIAGNOSIS — J690 Pneumonitis due to inhalation of food and vomit: Secondary | ICD-10-CM | POA: Diagnosis present

## 2022-01-14 DIAGNOSIS — M1990 Unspecified osteoarthritis, unspecified site: Secondary | ICD-10-CM | POA: Diagnosis present

## 2022-01-14 DIAGNOSIS — Z882 Allergy status to sulfonamides status: Secondary | ICD-10-CM

## 2022-01-14 DIAGNOSIS — Z79899 Other long term (current) drug therapy: Secondary | ICD-10-CM

## 2022-01-14 DIAGNOSIS — G40909 Epilepsy, unspecified, not intractable, without status epilepticus: Secondary | ICD-10-CM | POA: Diagnosis present

## 2022-01-14 DIAGNOSIS — N182 Chronic kidney disease, stage 2 (mild): Secondary | ICD-10-CM | POA: Diagnosis present

## 2022-01-14 DIAGNOSIS — J1282 Pneumonia due to coronavirus disease 2019: Secondary | ICD-10-CM

## 2022-01-14 DIAGNOSIS — N390 Urinary tract infection, site not specified: Secondary | ICD-10-CM | POA: Diagnosis present

## 2022-01-14 DIAGNOSIS — D649 Anemia, unspecified: Secondary | ICD-10-CM | POA: Diagnosis present

## 2022-01-14 DIAGNOSIS — E039 Hypothyroidism, unspecified: Secondary | ICD-10-CM | POA: Diagnosis present

## 2022-01-14 DIAGNOSIS — Z79890 Hormone replacement therapy (postmenopausal): Secondary | ICD-10-CM

## 2022-01-14 DIAGNOSIS — G9340 Encephalopathy, unspecified: Secondary | ICD-10-CM | POA: Diagnosis not present

## 2022-01-14 DIAGNOSIS — N1831 Chronic kidney disease, stage 3a: Secondary | ICD-10-CM | POA: Diagnosis present

## 2022-01-14 DIAGNOSIS — H9190 Unspecified hearing loss, unspecified ear: Secondary | ICD-10-CM | POA: Diagnosis present

## 2022-01-14 DIAGNOSIS — W19XXXA Unspecified fall, initial encounter: Secondary | ICD-10-CM

## 2022-01-14 DIAGNOSIS — U071 COVID-19: Principal | ICD-10-CM | POA: Diagnosis present

## 2022-01-14 DIAGNOSIS — J189 Pneumonia, unspecified organism: Secondary | ICD-10-CM

## 2022-01-14 DIAGNOSIS — R0602 Shortness of breath: Secondary | ICD-10-CM | POA: Diagnosis present

## 2022-01-14 LAB — CBC WITH DIFFERENTIAL/PLATELET
Abs Immature Granulocytes: 0.03 10*3/uL (ref 0.00–0.07)
Basophils Absolute: 0 10*3/uL (ref 0.0–0.1)
Basophils Relative: 0 %
Eosinophils Absolute: 0 10*3/uL (ref 0.0–0.5)
Eosinophils Relative: 0 %
HCT: 27.3 % — ABNORMAL LOW (ref 36.0–46.0)
Hemoglobin: 8.9 g/dL — ABNORMAL LOW (ref 12.0–15.0)
Immature Granulocytes: 1 %
Lymphocytes Relative: 15 %
Lymphs Abs: 0.9 10*3/uL (ref 0.7–4.0)
MCH: 26.3 pg (ref 26.0–34.0)
MCHC: 32.6 g/dL (ref 30.0–36.0)
MCV: 80.8 fL (ref 80.0–100.0)
Monocytes Absolute: 0.6 10*3/uL (ref 0.1–1.0)
Monocytes Relative: 9 %
Neutro Abs: 4.6 10*3/uL (ref 1.7–7.7)
Neutrophils Relative %: 75 %
Platelets: 291 10*3/uL (ref 150–400)
RBC: 3.38 MIL/uL — ABNORMAL LOW (ref 3.87–5.11)
RDW: 16.4 % — ABNORMAL HIGH (ref 11.5–15.5)
WBC: 6.2 10*3/uL (ref 4.0–10.5)
nRBC: 0 % (ref 0.0–0.2)

## 2022-01-14 LAB — COMPREHENSIVE METABOLIC PANEL
ALT: 13 U/L (ref 0–44)
AST: 20 U/L (ref 15–41)
Albumin: 2.8 g/dL — ABNORMAL LOW (ref 3.5–5.0)
Alkaline Phosphatase: 42 U/L (ref 38–126)
Anion gap: 11 (ref 5–15)
BUN: 12 mg/dL (ref 8–23)
CO2: 23 mmol/L (ref 22–32)
Calcium: 8.6 mg/dL — ABNORMAL LOW (ref 8.9–10.3)
Chloride: 97 mmol/L — ABNORMAL LOW (ref 98–111)
Creatinine, Ser: 0.67 mg/dL (ref 0.44–1.00)
GFR, Estimated: >60 mL/min (ref >60)
Glucose, Bld: 86 mg/dL (ref 70–99)
Potassium: 4 mmol/L (ref 3.5–5.1)
Sodium: 131 mmol/L — ABNORMAL LOW (ref 135–145)
Total Bilirubin: 0.9 mg/dL (ref 0.3–1.2)
Total Protein: 5.8 g/dL — ABNORMAL LOW (ref 6.5–8.1)

## 2022-01-14 LAB — RESP PANEL BY RT-PCR (FLU A&B, COVID) ARPGX2
Influenza A by PCR: NEGATIVE
Influenza B by PCR: NEGATIVE
SARS Coronavirus 2 by RT PCR: POSITIVE — AB

## 2022-01-14 LAB — T4, FREE: Free T4: 1.24 ng/dL — ABNORMAL HIGH (ref 0.61–1.12)

## 2022-01-14 LAB — CBG MONITORING, ED: Glucose-Capillary: 89 mg/dL (ref 70–99)

## 2022-01-14 LAB — TSH: TSH: 8.182 u[IU]/mL — ABNORMAL HIGH (ref 0.350–4.500)

## 2022-01-14 LAB — TROPONIN I (HIGH SENSITIVITY)
Troponin I (High Sensitivity): 11 ng/L (ref <18)
Troponin I (High Sensitivity): 11 ng/L (ref <18)

## 2022-01-14 MED ORDER — SODIUM CHLORIDE 0.9 % IV SOLN
500.0000 mg | INTRAVENOUS | Status: AC
  Administered 2022-01-14: 500 mg via INTRAVENOUS
  Filled 2022-01-14: qty 5

## 2022-01-14 MED ORDER — HYDRALAZINE HCL 20 MG/ML IJ SOLN
5.0000 mg | Freq: Four times a day (QID) | INTRAMUSCULAR | Status: DC | PRN
  Administered 2022-01-17 – 2022-01-21 (×5): 5 mg via INTRAVENOUS
  Filled 2022-01-14 (×5): qty 1

## 2022-01-14 MED ORDER — ACETAMINOPHEN 500 MG PO TABS: 500.0000 mg | ORAL_TABLET | Freq: Every day | ORAL | Status: DC | PRN

## 2022-01-14 MED ORDER — SODIUM CHLORIDE 0.9 % IV SOLN
100.0000 mg | Freq: Two times a day (BID) | INTRAVENOUS | Status: DC
  Administered 2022-01-14 – 2022-01-15 (×2): 100 mg via INTRAVENOUS
  Filled 2022-01-14 (×2): qty 100

## 2022-01-14 MED ORDER — DEXAMETHASONE SODIUM PHOSPHATE 10 MG/ML IJ SOLN
6.0000 mg | INTRAMUSCULAR | Status: DC
  Administered 2022-01-14 – 2022-01-17 (×4): 6 mg via INTRAVENOUS
  Filled 2022-01-14 (×2): qty 0.6
  Filled 2022-01-14: qty 1
  Filled 2022-01-14 (×2): qty 0.6

## 2022-01-14 MED ORDER — SODIUM CHLORIDE 0.9 % IV SOLN
1.0000 g | INTRAVENOUS | Status: AC
  Administered 2022-01-14: 1 g via INTRAVENOUS
  Filled 2022-01-14: qty 10

## 2022-01-14 MED ORDER — NIRMATRELVIR/RITONAVIR (PAXLOVID)TABLET
3.0000 | ORAL_TABLET | Freq: Two times a day (BID) | ORAL | Status: DC
  Filled 2022-01-14 (×2): qty 30

## 2022-01-14 MED ORDER — GUAIFENESIN-DM 100-10 MG/5ML PO SYRP: 10.0000 mL | ORAL_SOLUTION | ORAL | Status: DC | PRN

## 2022-01-14 MED ORDER — ENOXAPARIN SODIUM 30 MG/0.3ML IJ SOSY
30.0000 mg | PREFILLED_SYRINGE | INTRAMUSCULAR | Status: DC
  Administered 2022-01-14 – 2022-01-24 (×11): 30 mg via SUBCUTANEOUS
  Filled 2022-01-14 (×11): qty 0.3

## 2022-01-14 MED ORDER — ALBUTEROL SULFATE HFA 108 (90 BASE) MCG/ACT IN AERS
2.0000 | INHALATION_SPRAY | Freq: Four times a day (QID) | RESPIRATORY_TRACT | Status: DC
  Administered 2022-01-14 – 2022-01-15 (×3): 2 via RESPIRATORY_TRACT
  Filled 2022-01-14: qty 6.7

## 2022-01-14 MED ORDER — SODIUM CHLORIDE 0.9 % IV SOLN: INTRAVENOUS | Status: AC

## 2022-01-14 MED ORDER — LEVOTHYROXINE SODIUM 112 MCG PO TABS
112.0000 ug | ORAL_TABLET | Freq: Every morning | ORAL | Status: DC
  Filled 2022-01-14: qty 1

## 2022-01-14 MED ORDER — LEVETIRACETAM 500 MG PO TABS: 500.0000 mg | ORAL_TABLET | Freq: Two times a day (BID) | ORAL | Status: DC

## 2022-01-14 NOTE — ED Provider Notes (Signed)
[facility] EMERGENCY DEPARTMENT Provider Note   CSN: [id number] Arrival date & time: 01/14/22  5027     History  Chief Complaint  Patient presents with   Bethany Blake    [name] is a 88 y.o. female. 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  Patient lives at an independent living facility and was found on the ground today.  Patient does not recall fall.  Was initially complaining of left arm pain per EMS after her fall but is denying any pain to me at this time.  Says that she does remember being on the ground but does not remember how she got there.  Not currently on [facility].  Additional history was obtained per the patient daughter.  Reports that she has been sick for the past week.  Says that last weekend did have a bout of diarrhea but no nausea or vomiting.  Says that it has resolved but states she has been generally weak as that had decreased p.o. intake recently.  Says that she also has had a cough and congestion.  Started taking Mucinex 3 days ago but has had no other medication changes.  Has been compliant with her Synthroid.  Was taken off of the Keppra by her daughter because it made her confused and her EEG did not show any seizure-like activity.  Her daughter and caretakers intermittently help the patient out throughout the day with meals and medications.  Does report that she had 2 family members who were found to have COVID last week who were with the patient.       Home Medications Prior to Admission medications   Medication Sig Start Date End Date Taking? Authorizing Provider  acetaminophen (TYLENOL) 500 MG tablet Take 500 mg by mouth daily as needed (pain).    [provider]  carvedilol (COREG) 3.125 MG tablet Take 1 tablet (3.125 mg total) by mouth 2 (two) times daily with a meal. 01/28/20   Rodolph Bong,  MD  cholecalciferol (VITAMIN D3) 25 MCG (1000 UNIT) tablet Take 1,000 Units by mouth daily with lunch.    [provider]  hydrALAZINE (APRESOLINE) 50 MG tablet Take 1 tablet (50 mg total) by mouth 3 (three) times daily. Patient taking differently: Take 50 mg by mouth 3 (three) times daily with meals. 01/30/20   Rodolph Bong, MD  levETIRAcetam (KEPPRA) 500 MG tablet Take 1 tablet (500 mg total) by mouth 2 (two) times daily. 06/20/21   Glade Lloyd, MD  levothyroxine (SYNTHROID) 112 MCG tablet Take 1 tablet (112 mcg total) by mouth daily at 6 (six) AM. Patient taking differently: Take 112 mcg by mouth every morning. 01/27/20   Rodolph Bong, MD      Allergies    Ace inhibitors, Aldactone [spironolactone], Angiotensin receptor blockers, and Sulfa antibiotics    Review of Systems   Review of Systems  Physical Exam Updated Vital Signs BP (!) 120/54   Pulse 64   Temp 98.8 F (37.1 C) (Oral)   Resp 16   SpO2 94%  Physical Exam Vitals and nursing note reviewed.  Constitutional:      Appearance: She is well-developed.     Comments: No acute distress but chronically ill-appearing  HENT:     Head: Normocephalic and atraumatic.     Right Ear: External ear normal.  Left Ear: External ear normal.     Nose: Nose normal.     Mouth/Throat:     Mouth: Mucous membranes are dry.     Pharynx: Oropharynx is clear.  Eyes:     Extraocular Movements: Extraocular movements intact.     Conjunctiva/sclera: Conjunctivae normal.     Pupils: Pupils are equal, round, and reactive to light.  Neck:     Comments: No midline tenderness to palpation Cardiovascular:     Rate and Rhythm: Normal rate and regular rhythm.     Heart sounds: No murmur heard. Pulmonary:     Effort: No respiratory distress.     Breath sounds: Normal breath sounds.     Comments: Limited due to poor inspiratory effort Abdominal:     General: Abdomen is flat. There is no distension.     Palpations: Abdomen  is soft. There is no mass.     Tenderness: There is no abdominal tenderness. There is no guarding.  Musculoskeletal:        General: No swelling.     Cervical back: Normal range of motion and neck supple.     Right lower leg: No edema.     Left lower leg: No edema.     Comments: Left hip tenderness to palpation but no overlying bruising.  No significant left upper extremity tenderness to palpation or bruising noted.  Skin:    General: Skin is warm and dry.     Capillary Refill: Capillary refill takes less than 2 seconds.  Neurological:     Mental Status: She is alert and oriented to person, place, and time. Mental status is at baseline.  Psychiatric:        Mood and Affect: Mood normal.     ED Results / Procedures / Treatments   Labs (all labs ordered are listed, but only abnormal results are displayed) Labs Reviewed  RESP PANEL BY RT-PCR (FLU A&B, COVID) ARPGX2 - Abnormal; Notable for the following components:      Result Value   SARS Coronavirus 2 by RT PCR POSITIVE (*)    All other components within normal limits  COMPREHENSIVE METABOLIC PANEL - Abnormal; Notable for the following components:   Sodium 131 (*)    Chloride 97 (*)    Calcium 8.6 (*)    Total Protein 5.8 (*)    Albumin 2.8 (*)    All other components within normal limits  CBC WITH DIFFERENTIAL/PLATELET - Abnormal; Notable for the following components:   RBC 3.38 (*)    Hemoglobin 8.9 (*)    HCT 27.3 (*)    RDW 16.4 (*)    All other components within normal limits  TSH - Abnormal; Notable for the following components:   TSH 8.182 (*)    All other components within normal limits  T4, FREE - Abnormal; Notable for the following components:   Free T4 1.24 (*)    All other components within normal limits  EXPECTORATED SPUTUM ASSESSMENT W GRAM STAIN, RFLX TO RESP C  URINALYSIS, ROUTINE W REFLEX MICROSCOPIC  BLOOD GAS, VENOUS  T3  D-DIMER, QUANTITATIVE  FERRITIN  FIBRINOGEN  LACTATE DEHYDROGENASE   PROCALCITONIN  CBC  CBC WITH DIFFERENTIAL/PLATELET  COMPREHENSIVE METABOLIC PANEL  C-REACTIVE PROTEIN  D-DIMER, QUANTITATIVE  FERRITIN  MAGNESIUM  PHOSPHORUS  CBG MONITORING, ED  TROPONIN I (HIGH SENSITIVITY)  TROPONIN I (HIGH SENSITIVITY)    EKG EKG Interpretation  Date/Time:  Saturday January 14 2022 10:08:15 EDT Ventricular Rate:  67 PR Interval:  166 QRS Duration: 92 QT Interval:  411 QTC Calculation: 434 R Axis:   36 Text Interpretation: Sinus rhythm Probable anterior infarct, old Confirmed by Vonita Moss [phone]) on 01/14/2022 12:20:26 PM  Radiology DG Elbow 2 Views Left  Result Date: 01/14/2022 CLINICAL DATA:  Elbow abnormality on humeral radiographs EXAM: LEFT ELBOW - 2 VIEW COMPARISON:  LEFT humeral radiographs 01/14/2022 FINDINGS: Osseous demineralization. Tiny olecranon spur. 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 Associated joint effusion. No acute fracture or dislocation identified Degenerative changes at elbow joint. IMPRESSION: Degenerative changes LEFT elbow joint. 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 Electronically Signed   By: Ulyses Southward M.D.   On: 01/14/2022 12:52   DG Hip Unilat W or Wo Pelvis 2-3 Views Left  Result Date: 01/14/2022 CLINICAL DATA:  88 year old female with fall out of bed. Cough and congestion. EXAM: DG HIP (WITH OR WITHOUT PELVIS) 2-3V LEFT COMPARISON:  Pelvis radiographs 11/06/2018. FINDINGS: Bone mineralization is within normal limits for age. Femoral heads are normally located. Evidence of chronic pubic rami fractures. No definite acute pelvis fracture or dislocation. Grossly intact proximal right femur. Proximal left femur appears intact. Nonobstructed visible bowel gas pattern with retained stool in the rectum. Aortoiliac calcified atherosclerosis. IMPRESSION: No acute fracture or dislocation identified about the left hip or pelvis. If occult hip fracture is  suspected or if the patient is unable to weightbear, MRI is the preferred modality for further evaluation. Electronically Signed   By: Odessa Fleming M.D.   On: 01/14/2022 11:27   DG Humerus Left  Result Date: 01/14/2022 CLINICAL DATA:  88 year old female with fall out of bed. Cough and congestion. EXAM: LEFT HUMERUS - 2+ VIEW COMPARISON:  Left elbow series 01/03/2011. FINDINGS: Osteopenia. Left humerus appears intact, but there is evidence of chronic bone resorption of the proximal radius which was fractured in 2012. Grossly maintained other alignment at the left elbow and shoulder. Negative visible left ribs and chest. IMPRESSION: 1. Osteopenia. No definite acute fracture or dislocation identified about the left humerus. 2. Suspect chronic resorption of bone from the proximal left radius since 2012. Electronically Signed   By: Odessa Fleming M.D.   On: 01/14/2022 11:26   DG Chest 2 View  Result Date: 01/14/2022 CLINICAL DATA:  88 year old female with fall out of bed. Cough and congestion. EXAM: CHEST - 2 VIEW COMPARISON:  Portable chest 01/23/2020 and earlier. FINDINGS: Supine AP and lateral views of the chest at 1111 hours. Cardiomegaly. Tortuous and calcified thoracic aorta. Mediastinal contours appear stable since 2021. Visualized tracheal air column is within normal limits. No pneumothorax, pulmonary edema, pleural effusion, or consolidation identified. Osteopenia and chronic kyphoscoliosis. Chronic deformity proximal right humerus appears stable. No acute osseous abnormality identified. Negative visible bowel gas. IMPRESSION: 1. No acute cardiopulmonary abnormality. 2. Cardiomegaly and Aortic Atherosclerosis (ICD10-I70.0). 3. Osteopenia and spinal kyphoscoliosis. Electronically Signed   By: Odessa Fleming M.D.   On: 01/14/2022 11:25   CT HEAD WO CONTRAST  Result Date: 01/14/2022 CLINICAL DATA:  Un witnessed fall this morning. EXAM: CT HEAD WITHOUT CONTRAST CT CERVICAL SPINE WITHOUT CONTRAST TECHNIQUE: Multidetector CT  imaging of the head and cervical spine was performed following the standard protocol without intravenous contrast. Multiplanar CT image reconstructions of the cervical spine were also generated. RADIATION DOSE REDUCTION: This exam was performed according to the departmental dose-optimization program which includes automated exposure control, adjustment of the mA and/or kV according to patient size  and/or use of iterative reconstruction technique. COMPARISON:  06/19/2021. FINDINGS: CT HEAD FINDINGS Brain: No evidence of acute infarction, hemorrhage, hydrocephalus, extra-axial collection or mass lesion/mass effect. 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 No hypervascular skull base vessel. Skull: Normal. Negative for fracture or focal lesion. Sinuses/Orbits: Globes and orbits are unremarkable. Sinuses essentially clear. Other: None. CT CERVICAL SPINE FINDINGS Alignment: Kyphosis, apex at C4. Slight anterolisthesis of C2 on C3 and C3 on C4, degenerative. These findings are stable. Skull base and vertebrae: No acute fracture. No primary bone lesion or focal pathologic process. Soft tissues and spinal canal: No prevertebral fluid or swelling. No visible canal hematoma. Disc levels: Mild loss of disc height at C2-C3 and C3-C4. Marked loss of disc height at C4-C5 and C5-C6. Moderate loss of disc height at C6-C7. Bilateral facet degenerative changes. No convincing disc herniation. Upper chest: No acute findings. Other: None. IMPRESSION: HEAD CT 1. No acute intracranial abnormalities. CERVICAL CT 1. No fracture or acute finding. Electronically Signed   By: Amie Portland M.D.   On: 01/14/2022 11:10   CT CERVICAL SPINE WO CONTRAST  Result Date: 01/14/2022 CLINICAL DATA:  Un witnessed fall this morning. EXAM: CT HEAD WITHOUT CONTRAST CT CERVICAL SPINE WITHOUT CONTRAST TECHNIQUE: Multidetector CT imaging of the head and cervical spine was performed following the standard protocol without  intravenous contrast. Multiplanar CT image reconstructions of the cervical spine were also generated. RADIATION DOSE REDUCTION: This exam was performed according to the departmental dose-optimization program which includes automated exposure control, adjustment of the mA and/or kV according to patient size and/or use of iterative reconstruction technique. COMPARISON:  06/19/2021. FINDINGS: CT HEAD FINDINGS Brain: No evidence of acute infarction, hemorrhage, hydrocephalus, extra-axial collection or mass lesion/mass effect. 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 No hypervascular skull base vessel. Skull: Normal. Negative for fracture or focal lesion. Sinuses/Orbits: Globes and orbits are unremarkable. Sinuses essentially clear. Other: None. CT CERVICAL SPINE FINDINGS Alignment: Kyphosis, apex at C4. Slight anterolisthesis of C2 on C3 and C3 on C4, degenerative. These findings are stable. Skull base and vertebrae: No acute fracture. No primary bone lesion or focal pathologic process. Soft tissues and spinal canal: No prevertebral fluid or swelling. No visible canal hematoma. Disc levels: Mild loss of disc height at C2-C3 and C3-C4. Marked loss of disc height at C4-C5 and C5-C6. Moderate loss of disc height at C6-C7. Bilateral facet degenerative changes. No convincing disc herniation. Upper chest: No acute findings. Other: None. IMPRESSION: HEAD CT 1. No acute intracranial abnormalities. CERVICAL CT 1. No fracture or acute finding. Electronically Signed   By: Amie Portland M.D.   On: 01/14/2022 11:10    Procedures Procedures   Medications Ordered in ED Medications  0.9 %  sodium chloride infusion ( Intravenous New Bag/Given 01/14/22 1643)  acetaminophen (TYLENOL) tablet 500 mg (has no administration in time range)  hydrALAZINE (APRESOLINE) injection 5 mg (has no administration in time range)  levothyroxine (SYNTHROID) tablet 112 mcg (has no administration in time range)   levETIRAcetam (KEPPRA) tablet 500 mg (has no administration in time range)  enoxaparin (LOVENOX) injection 30 mg (has no administration in time range)  nirmatrelvir/ritonavir EUA (PAXLOVID) 3 tablet (has no administration in time range)  albuterol (VENTOLIN HFA) 108 (90 Base) MCG/ACT inhaler 2 puff (2 puffs Inhalation Given 01/14/22 1648)  dexamethasone (DECADRON) injection 6 mg (6 mg Intravenous Given 01/14/22 1642)  guaiFENesin-dextromethorphan (ROBITUSSIN DM) 100-10 MG/5ML syrup 10 mL (has no administration in time range)  doxycycline (VIBRAMYCIN) 100 mg in sodium chloride 0.9 % 250 mL IVPB (has no administration in time range)  cefTRIAXone (ROCEPHIN) 1 g in sodium chloride 0.9 % 100 mL IVPB (0 g Intravenous Stopped 01/14/22 1644)  azithromycin (ZITHROMAX) 500 mg in sodium chloride 0.9 % 250 mL IVPB (500 mg Intravenous New Bag/Given 01/14/22 1644)    ED Course/ Medical Decision Making/ A&P Clinical Course as of 01/14/22 1750  Sat Jan 14, 2022  1214 Hemoglobin(!): 8.9 At baseline [RP]  1215 CT head reviewed and interpreted by me as not showing any evidence of acute abnormality. [RP]  2778 Chest x-ray reviewed and interpreted by me as showing hazy opacifications of the right hemifield concerning for possible pneumonia. [RP]  1219 Hip x-ray reviewed and interpreted by me as not showing any evidence of fracture. [RP]  2423 X-ray shows nonunion of old proximal radius fracture.  No acute abnormality. [RP]  5361 Spoke with Hospitalist Dr Markus Jarvis who will evaluate the patient shortly.  [RP]  4431 SARS Coronavirus 2 by RT PCR(!): POSITIVE [RP]    Clinical Course User Index [RP] Fransico Meadow, MD                           Medical Decision Making Amount and/or Complexity of Data Reviewed Labs: ordered. Decision-making details documented in ED Course. Radiology: ordered.  Risk Decision regarding hospitalization.   Lamoine KIMISHA EUNICE is a 88 y.o. female with comorbidities that complicate the  patient evaluation including ICH previously on Keppra, UTIs, and hypothyroidism who presents with chief complaint of 1 week of generalized weakness and cough and a fall today.  This patient presents to the ED for concern of complaints listed in HPI, this involves an extensive number of treatment options, and is a complaint that carries with it a high risk of complications and morbidity.   Initial Ddx:  COVID, pneumonia, UTI, ICH, C-spine injury, MI, left upper extremity fracture, left hip fracture, hypothyroidism  MDM:  Feel the patient is likely having COVID or pneumonia causing her generalized weakness which led her to fall today.  UTI would also be on the differential given the patient's age and history of frequent UTIs.  Also considering MI this is a common presentation for elderly individuals with the weakness.  Will obtain imaging to evaluate for ICH and C-spine injury as well as any extremity injuries.  Plan:  Labs COVID and flu Chest x-ray EKG Troponins TSH CT head and C-spine Left upper extremity x-ray Left hip x-ray  ED Summary:  Patient had a chest x-ray which was read and interpreted by me as showing possible pneumonia on the right hemifield.  She was given ceftriaxone and azithromycin.  Additional labs were obtained which showed that she also had influenza.  EKG and troponins were unremarkable.  CT head and C-spine did not show any evidence of acute injury nor did her extremity x-rays.  Discussed possible discharge with the patient's daughter but given that they do not have higher levels of care aside from independent living at her facility do not feel that this would be safe for the patient so she was admitted to medicine for further management.  Dispo: Admit to Floor   Additional history obtained from daughter Records reviewed DC Summary The following labs were independently interpreted: Chemistry, CBC I independently reviewed the following imaging with scope of  interpretation limited to determining acute life threatening conditions related to emergency care: Chest x-ray, which revealed  right sided infiltrate   I personally reviewed and interpreted cardiac monitoring: normal sinus rhythm  I personally reviewed and interpreted the pt's EKG: see above for interpretation  I have reviewed the patients home medications and made adjustments as needed Social Determinants of health:  Independent living resident    Final Clinical Impression(s) / ED Diagnoses Final diagnoses:  Fall, initial encounter  Pneumonia of right lung due to infectious organism, unspecified part of lung  COVID-19    Rx / DC Orders ED Discharge Orders     None         Rondel Baton, MD 01/14/22 1750

## 2022-01-14 NOTE — ED Triage Notes (Signed)
Pt coming from [facility], an independent living facility. Pt had an unwitnessed fall this morning out of bed, appeared to fall on her left side. Pt initially c/o left arm pain. Pt denied hitting head and LOC. Daughter reports that pt has decreased appetite this week and has been dealing with some dehydration and weakness. Pt has bouts of confusion at baseline but appears to be more confused than normal.   BP 118/74 HR 80 96% room air CBG 114

## 2022-01-14 NOTE — H&P (Signed)
History and Physical    Bethany Blake [id number] DOB: [birth year] DOA: 01/14/2022  PCP: [name], MD (Confirm with patient/family/NH records and if not entered, this has to be entered at [facility] point of entry) Patient coming from: [facility]  I have personally briefly reviewed patient's old medical records in [place]  Chief Complaint: Patient somnolent  HPI: Bethany Blake is a 88 y.o. female with medical history significant of HTN, CKD stage II, chronic hyponatremia, hypothyroidism, sent from facility for evaluation of worsening of cough and mentation changes.  Patient unable to provide any history, or history provided by daughter at bedside.  Daughter reported that 6-7 days ago, patient had 2 days of loose bowel movement diarrhea no abdominal pain no fever chills and symptoms resolved spontaneously.  Over the weekend, 2 family member visited the patient and both turned positive for COVID on Monday.  4 to 5 days ago, patient developed poor intake of food and fluid and developed episodic of confusion and general weakness and has been sleeping more and soon patient started to have cough, thick whitish, no fever seen by family.  This morning, patient was found to be more confused than before and sent to ED.  Family also reported the patient might have fell down some point this week and patient has been complaining about left-sided arm pain.  ED Course: No hypoxia no hypotension no tachycardia, trauma scan CT head and neck arm elbow wrist negative for fracture or dislocation.  COVID-positive, chest x-ray appears to be no acute infiltrates.  Review of Systems: Unable to perform, patient confused.  Past Medical History:  Diagnosis Date   Anxiety    Arthritis    Chronic kidney disease    Hypertension    Hypokalemia    Hyponatremia    Hypothyroidism     Past Surgical History:  Procedure Laterality Date   SHOULDER SURGERY     WRIST SURGERY       reports that she has quit  smoking. She has never used smokeless tobacco. She reports that she does not drink alcohol and does not use drugs.  Allergies  Allergen Reactions   Ace Inhibitors Other (See Comments)    Severe hyponatremia   Aldactone [Spironolactone] Other (See Comments)    Severe hyponatremia   Angiotensin Receptor Blockers Other (See Comments)    Severe hyponatremia   Sulfa Antibiotics Rash    Family History  Problem Relation Age of Onset   Hypertension Other    Colon cancer Neg Hx    Colon polyps Neg Hx    Rectal cancer Neg Hx    Stomach cancer Neg Hx     Prior to Admission medications   Medication Sig Start Date End Date Taking? Authorizing Provider  acetaminophen (TYLENOL) 500 MG tablet Take 500 mg by mouth daily as needed (pain).    [provider]  carvedilol (COREG) 3.125 MG tablet Take 1 tablet (3.125 mg total) by mouth 2 (two) times daily with a meal. 01/28/20   Eugenie Filler, MD  cholecalciferol (VITAMIN D3) 25 MCG (1000 UNIT) tablet Take 1,000 Units by mouth daily with lunch.    [provider]  hydrALAZINE (APRESOLINE) 50 MG tablet Take 1 tablet (50 mg total) by mouth 3 (three) times daily. Patient taking differently: Take 50 mg by mouth 3 (three) times daily with meals. 01/30/20   Eugenie Filler, MD  levETIRAcetam (KEPPRA) 500 MG tablet Take 1 tablet (500 mg total) by mouth 2 (two)  times daily. 06/20/21   Aline August, MD  levothyroxine (SYNTHROID) 112 MCG tablet Take 1 tablet (112 mcg total) by mouth daily at 6 (six) AM. Patient taking differently: Take 112 mcg by mouth every morning. 01/27/20   Eugenie Filler, MD    Physical Exam: Vitals:   01/14/22 1445 01/14/22 1515 01/14/22 1530 01/14/22 1545  BP: (!) 156/60 (!) 145/54 (!) 150/55 (!) 145/56  Pulse: 65 68 63 62  Resp: 15 17 11 16   Temp:      TempSrc:      SpO2: 95% 96% 96% 95%    Constitutional: NAD, calm, comfortable Vitals:   01/14/22 1445 01/14/22 1515 01/14/22 1530 01/14/22 1545   BP: (!) 156/60 (!) 145/54 (!) 150/55 (!) 145/56  Pulse: 65 68 63 62  Resp: 15 17 11 16   Temp:      TempSrc:      SpO2: 95% 96% 96% 95%   Eyes: PERRL, lids and conjunctivae normal ENMT: Mucous membranes are dry. Posterior pharynx clear of any exudate or lesions.Normal dentition.  Neck: normal, supple, no masses, no thyromegaly Respiratory: clear to auscultation bilaterally, no wheezing, no crackles. Normal respiratory effort. No accessory muscle use.  Cardiovascular: Regular rate and rhythm, no murmurs / rubs / gallops. No extremity edema. 2+ pedal pulses. No carotid bruits.  Abdomen: no tenderness, no masses palpated. No hepatosplenomegaly. Bowel sounds positive.  Musculoskeletal: no clubbing / cyanosis. No joint deformity upper and lower extremities. Good ROM, no contractures. Normal muscle tone.  Skin: no rashes, lesions, ulcers. No induration Neurologic: CN 2-12 grossly intact. Sensation intact, DTR normal. Strength 5/5 in all 4.  Psychiatric: Normal judgment and insight. Alert and oriented x 3. Normal mood.     Labs on Admission: I have personally reviewed following labs and imaging studies  CBC: Recent Labs  Lab 01/14/22 1145  WBC 6.2  NEUTROABS 4.6  HGB 8.9*  HCT 27.3*  MCV 80.8  PLT [id number]   Basic Metabolic Panel: Recent Labs  Lab 01/14/22 1145  NA 131*  K 4.0  CL 97*  CO2 23  GLUCOSE 86  BUN 12  CREATININE 0.67  CALCIUM 8.6*   GFR: CrCl cannot be calculated (Unknown ideal weight.). Liver Function Tests: Recent Labs  Lab 01/14/22 1145  AST 20  ALT 13  ALKPHOS 42  BILITOT 0.9  PROT 5.8*  ALBUMIN 2.8*   No results for input(s): "LIPASE", "AMYLASE" in the last 168 hours. No results for input(s): "AMMONIA" in the last 168 hours. Coagulation Profile: No results for input(s): "INR", "PROTIME" in the last 168 hours. Cardiac Enzymes: No results for input(s): "CKTOTAL", "CKMB", "CKMBINDEX", "TROPONINI" in the last 168 hours. BNP (last 3 results) No  results for input(s): "PROBNP" in the last 8760 hours. HbA1C: No results for input(s): "HGBA1C" in the last 72 hours. CBG: Recent Labs  Lab 01/14/22 1143  GLUCAP 89   Lipid Profile: No results for input(s): "CHOL", "HDL", "LDLCALC", "TRIG", "CHOLHDL", "LDLDIRECT" in the last 72 hours. Thyroid Function Tests: Recent Labs    01/14/22 1145  TSH 8.182*   Anemia Panel: No results for input(s): "VITAMINB12", "FOLATE", "FERRITIN", "TIBC", "IRON", "RETICCTPCT" in the last 72 hours. Urine analysis:    Component Value Date/Time   COLORURINE YELLOW 06/19/2021 [place] 06/19/2021 1417   LABSPEC 1.010 06/19/2021 1417   PHURINE 6.0 06/19/2021 1417   GLUCOSEU NEGATIVE 06/19/2021 1417   HGBUR NEGATIVE 06/19/2021 1417   BILIRUBINUR NEGATIVE 06/19/2021 1417   KETONESUR NEGATIVE 06/19/2021  [place] 06/19/2021 1417   UROBILINOGEN 0.2 08/28/2014 0227   NITRITE NEGATIVE 06/19/2021 1417   LEUKOCYTESUR SMALL (A) 06/19/2021 1417    Radiological Exams on Admission: DG Elbow 2 Views Left  Result Date: 01/14/2022 CLINICAL DATA:  Elbow abnormality on humeral radiographs EXAM: LEFT ELBOW - 2 VIEW COMPARISON:  LEFT humeral radiographs 01/14/2022 FINDINGS: Osseous demineralization. Tiny olecranon spur. 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 Associated joint effusion. No acute fracture or dislocation identified Degenerative changes at elbow joint. IMPRESSION: Degenerative changes LEFT elbow joint. 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 Electronically Signed   By: Lavonia Dana M.D.   On: 01/14/2022 12:52   DG Hip Unilat W or Wo Pelvis 2-3 Views Left  Result Date: 01/14/2022 CLINICAL DATA:  88 year old female with fall out of bed. Cough and congestion. EXAM: DG HIP (WITH OR WITHOUT PELVIS) 2-3V LEFT COMPARISON:  Pelvis radiographs 11/06/2018. FINDINGS: Bone mineralization is within normal limits for  age. Femoral heads are normally located. Evidence of chronic pubic rami fractures. No definite acute pelvis fracture or dislocation. Grossly intact proximal right femur. Proximal left femur appears intact. Nonobstructed visible bowel gas pattern with retained stool in the rectum. Aortoiliac calcified atherosclerosis. IMPRESSION: No acute fracture or dislocation identified about the left hip or pelvis. If occult hip fracture is suspected or if the patient is unable to weightbear, MRI is the preferred modality for further evaluation. Electronically Signed   By: Genevie Ann M.D.   On: 01/14/2022 11:27   DG Humerus Left  Result Date: 01/14/2022 CLINICAL DATA:  88 year old female with fall out of bed. Cough and congestion. EXAM: LEFT HUMERUS - 2+ VIEW COMPARISON:  Left elbow series 01/03/2011. FINDINGS: Osteopenia. Left humerus appears intact, but there is evidence of chronic bone resorption of the proximal radius which was fractured in 2012. Grossly maintained other alignment at the left elbow and shoulder. Negative visible left ribs and chest. IMPRESSION: 1. Osteopenia. No definite acute fracture or dislocation identified about the left humerus. 2. Suspect chronic resorption of bone from the proximal left radius since 2012. Electronically Signed   By: Genevie Ann M.D.   On: 01/14/2022 11:26   DG Chest 2 View  Result Date: 01/14/2022 CLINICAL DATA:  88 year old female with fall out of bed. Cough and congestion. EXAM: CHEST - 2 VIEW COMPARISON:  Portable chest 01/23/2020 and earlier. FINDINGS: Supine AP and lateral views of the chest at 1111 hours. Cardiomegaly. Tortuous and calcified thoracic aorta. Mediastinal contours appear stable since 2021. Visualized tracheal air column is within normal limits. No pneumothorax, pulmonary edema, pleural effusion, or consolidation identified. Osteopenia and chronic kyphoscoliosis. Chronic deformity proximal right humerus appears stable. No acute osseous abnormality identified.  Negative visible bowel gas. IMPRESSION: 1. No acute cardiopulmonary abnormality. 2. Cardiomegaly and Aortic Atherosclerosis (ICD10-I70.0). 3. Osteopenia and spinal kyphoscoliosis. Electronically Signed   By: Genevie Ann M.D.   On: 01/14/2022 11:25   CT HEAD WO CONTRAST  Result Date: 01/14/2022 CLINICAL DATA:  Un witnessed fall this morning. EXAM: CT HEAD WITHOUT CONTRAST CT CERVICAL SPINE WITHOUT CONTRAST TECHNIQUE: Multidetector CT imaging of the head and cervical spine was performed following the standard protocol without intravenous contrast. Multiplanar CT image reconstructions of the cervical spine were also generated. RADIATION DOSE REDUCTION: This exam was performed according to the departmental dose-optimization program which includes automated exposure control, adjustment of the mA and/or kV according to patient size and/or use  of iterative reconstruction technique. COMPARISON:  06/19/2021. FINDINGS: CT HEAD FINDINGS Brain: No evidence of acute infarction, hemorrhage, hydrocephalus, extra-axial collection or mass lesion/mass effect. 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 No hypervascular skull base vessel. Skull: Normal. Negative for fracture or focal lesion. Sinuses/Orbits: Globes and orbits are unremarkable. Sinuses essentially clear. Other: None. CT CERVICAL SPINE FINDINGS Alignment: Kyphosis, apex at C4. Slight anterolisthesis of C2 on C3 and C3 on C4, degenerative. These findings are stable. Skull base and vertebrae: No acute fracture. No primary bone lesion or focal pathologic process. Soft tissues and spinal canal: No prevertebral fluid or swelling. No visible canal hematoma. Disc levels: Mild loss of disc height at C2-C3 and C3-C4. Marked loss of disc height at C4-C5 and C5-C6. Moderate loss of disc height at C6-C7. Bilateral facet degenerative changes. No convincing disc herniation. Upper chest: No acute findings. Other: None. IMPRESSION: HEAD CT 1. No acute  intracranial abnormalities. CERVICAL CT 1. No fracture or acute finding. Electronically Signed   By: Lajean Manes M.D.   On: 01/14/2022 11:10   CT CERVICAL SPINE WO CONTRAST  Result Date: 01/14/2022 CLINICAL DATA:  Un witnessed fall this morning. EXAM: CT HEAD WITHOUT CONTRAST CT CERVICAL SPINE WITHOUT CONTRAST TECHNIQUE: Multidetector CT imaging of the head and cervical spine was performed following the standard protocol without intravenous contrast. Multiplanar CT image reconstructions of the cervical spine were also generated. RADIATION DOSE REDUCTION: This exam was performed according to the departmental dose-optimization program which includes automated exposure control, adjustment of the mA and/or kV according to patient size and/or use of iterative reconstruction technique. COMPARISON:  06/19/2021. FINDINGS: CT HEAD FINDINGS Brain: No evidence of acute infarction, hemorrhage, hydrocephalus, extra-axial collection or mass lesion/mass effect. 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 No hypervascular skull base vessel. Skull: Normal. Negative for fracture or focal lesion. Sinuses/Orbits: Globes and orbits are unremarkable. Sinuses essentially clear. Other: None. CT CERVICAL SPINE FINDINGS Alignment: Kyphosis, apex at C4. Slight anterolisthesis of C2 on C3 and C3 on C4, degenerative. These findings are stable. Skull base and vertebrae: No acute fracture. No primary bone lesion or focal pathologic process. Soft tissues and spinal canal: No prevertebral fluid or swelling. No visible canal hematoma. Disc levels: Mild loss of disc height at C2-C3 and C3-C4. Marked loss of disc height at C4-C5 and C5-C6. Moderate loss of disc height at C6-C7. Bilateral facet degenerative changes. No convincing disc herniation. Upper chest: No acute findings. Other: None. IMPRESSION: HEAD CT 1. No acute intracranial abnormalities. CERVICAL CT 1. No fracture or acute finding. Electronically Signed    By: Lajean Manes M.D.   On: 01/14/2022 11:10    EKG: Independently reviewed.  Sinus rhythm, no acute ST changes.  Assessment/Plan Principal Problem:   COVID Active Problems:   Pneumonia due to COVID-19 virus  (please populate well all problems here in Problem List. (For example, if patient is on BP meds at home and you resume or decide to hold them, it is a problem that needs to be her. Same for CAD, COPD, HLD and so on)  Acute metabolic encephalopathy -Likely from COVID-pneumonia -Start antiviral treatment with Paxlovid -Treat concurrent conditions, also suspect concurrent postviral pneumonia, which will be treated with doxycycline. -Hold off PO meds for now given significant risk of suppression this point -Incentive spirometry and flutter valve -PT evaluation  Severe dehydration -Appears to be related to poor oral intake, and the previous diarrhea has resolved. -Given patient's age, will treat with  careful hydration x12 hours, reevaluate volume status in the morning, also ordered chest x-ray for the morning  HTN -Hold off p.o. BP meds, as needed hydralazine for now  History of hypothyroidism, -Elevated TSH, likely euthyroid sick syndrome -Continue current dosage of Synthroid, recheck TSH 4 to 6 weeks.  Seizure disorder -No acute concern, continue Keppra  Moderate protein calorie malnutrition -Consult dietitian and speech evaluation  CKD stage II -Severe volume contracted, on IV fluid, recheck kidney function tomorrow  DVT prophylaxis: Lovenox Code Status: DNR Family Communication: Daughter at bedside Disposition Plan: Patient sick with COVID-pneumonia and mentation changes requiring inpatient treatment, expect more than 2 midnight hospital stay. Consults called: None Admission status: Telemetry admission   Lequita Halt MD [facility] Pager [phone]  01/14/2022, 4:03 PM

## 2022-01-14 NOTE — Progress Notes (Signed)
NEW ADMISSION NOTE New Admission Note:   Arrival Method: stretcher Mental Orientation:  A&O 1 Telemetry: Assessment: Completed Skin: intact IV: Left infusing Pain: denies Tubes: Safety Measures: Safety Fall Prevention Plan has been given, discussed and signed Admission: Completed [facility] Orientation: Patient has been orientated to the room, unit and staff.  Family: daughter bedside  Orders have been reviewed and implemented. Will continue to monitor the patient. Call light has been placed within reach and bed alarm has been activated.   Berneta Levins, RN

## 2022-01-15 ENCOUNTER — Inpatient Hospital Stay (HOSPITAL_COMMUNITY): Payer: Medicare Other

## 2022-01-15 DIAGNOSIS — Z515 Encounter for palliative care: Secondary | ICD-10-CM

## 2022-01-15 DIAGNOSIS — Z66 Do not resuscitate: Secondary | ICD-10-CM | POA: Diagnosis not present

## 2022-01-15 DIAGNOSIS — U071 COVID-19: Secondary | ICD-10-CM | POA: Diagnosis not present

## 2022-01-15 DIAGNOSIS — G9340 Encephalopathy, unspecified: Secondary | ICD-10-CM | POA: Diagnosis not present

## 2022-01-15 DIAGNOSIS — W19XXXA Unspecified fall, initial encounter: Secondary | ICD-10-CM | POA: Diagnosis not present

## 2022-01-15 DIAGNOSIS — J690 Pneumonitis due to inhalation of food and vomit: Secondary | ICD-10-CM | POA: Diagnosis present

## 2022-01-15 DIAGNOSIS — Y92009 Unspecified place in unspecified non-institutional (private) residence as the place of occurrence of the external cause: Secondary | ICD-10-CM

## 2022-01-15 DIAGNOSIS — Z7189 Other specified counseling: Secondary | ICD-10-CM | POA: Diagnosis not present

## 2022-01-15 DIAGNOSIS — I10 Essential (primary) hypertension: Secondary | ICD-10-CM

## 2022-01-15 LAB — CBC WITH DIFFERENTIAL/PLATELET
Abs Immature Granulocytes: 0.02 10*3/uL (ref 0.00–0.07)
Basophils Absolute: 0 10*3/uL (ref 0.0–0.1)
Basophils Relative: 0 %
Eosinophils Absolute: 0 10*3/uL (ref 0.0–0.5)
Eosinophils Relative: 0 %
HCT: 26.7 % — ABNORMAL LOW (ref 36.0–46.0)
Hemoglobin: 8.4 g/dL — ABNORMAL LOW (ref 12.0–15.0)
Immature Granulocytes: 1 %
Lymphocytes Relative: 17 %
Lymphs Abs: 0.4 10*3/uL — ABNORMAL LOW (ref 0.7–4.0)
MCH: 25.8 pg — ABNORMAL LOW (ref 26.0–34.0)
MCHC: 31.5 g/dL (ref 30.0–36.0)
MCV: 81.9 fL (ref 80.0–100.0)
Monocytes Absolute: 0.1 10*3/uL (ref 0.1–1.0)
Monocytes Relative: 2 %
Neutro Abs: 2.1 10*3/uL (ref 1.7–7.7)
Neutrophils Relative %: 80 %
Platelets: 330 10*3/uL (ref 150–400)
RBC: 3.26 MIL/uL — ABNORMAL LOW (ref 3.87–5.11)
RDW: 16.5 % — ABNORMAL HIGH (ref 11.5–15.5)
WBC: 2.6 10*3/uL — ABNORMAL LOW (ref 4.0–10.5)
nRBC: 0 % (ref 0.0–0.2)

## 2022-01-15 LAB — FERRITIN
Ferritin: 20 ng/mL (ref 11–307)
Ferritin: 21 ng/mL (ref 11–307)

## 2022-01-15 LAB — COMPREHENSIVE METABOLIC PANEL
ALT: 13 U/L (ref 0–44)
AST: 15 U/L (ref 15–41)
Albumin: 2.4 g/dL — ABNORMAL LOW (ref 3.5–5.0)
Alkaline Phosphatase: 39 U/L (ref 38–126)
Anion gap: 12 (ref 5–15)
BUN: 12 mg/dL (ref 8–23)
CO2: 23 mmol/L (ref 22–32)
Calcium: 8.6 mg/dL — ABNORMAL LOW (ref 8.9–10.3)
Chloride: 97 mmol/L — ABNORMAL LOW (ref 98–111)
Creatinine, Ser: 0.78 mg/dL (ref 0.44–1.00)
GFR, Estimated: >60 mL/min (ref >60)
Glucose, Bld: 102 mg/dL — ABNORMAL HIGH (ref 70–99)
Potassium: 4.2 mmol/L (ref 3.5–5.1)
Sodium: 132 mmol/L — ABNORMAL LOW (ref 135–145)
Total Bilirubin: 0.7 mg/dL (ref 0.3–1.2)
Total Protein: 5.2 g/dL — ABNORMAL LOW (ref 6.5–8.1)

## 2022-01-15 LAB — BLOOD GAS, VENOUS
Acid-base deficit: 2.5 mmol/L — ABNORMAL HIGH (ref 0.0–2.0)
Bicarbonate: 21.7 mmol/L (ref 20.0–28.0)
O2 Saturation: 97.5 %
Patient temperature: 36.1
pCO2, Ven: 34 mmHg — ABNORMAL LOW (ref 44–60)
pH, Ven: 7.41 (ref 7.25–7.43)
pO2, Ven: 76 mmHg — ABNORMAL HIGH (ref 32–45)

## 2022-01-15 LAB — URINALYSIS, ROUTINE W REFLEX MICROSCOPIC
Bilirubin Urine: NEGATIVE
Glucose, UA: NEGATIVE mg/dL
Ketones, ur: 20 mg/dL — AB
Nitrite: POSITIVE — AB
Protein, ur: NEGATIVE mg/dL
Specific Gravity, Urine: 1.016 (ref 1.005–1.030)
pH: 5 (ref 5.0–8.0)

## 2022-01-15 LAB — D-DIMER, QUANTITATIVE: D-Dimer, Quant: 2.16 ug/mL-FEU — ABNORMAL HIGH (ref 0.00–0.50)

## 2022-01-15 LAB — PROCALCITONIN: Procalcitonin: <0.1 ng/mL

## 2022-01-15 LAB — FIBRINOGEN: Fibrinogen: 460 mg/dL (ref 210–475)

## 2022-01-15 LAB — PHOSPHORUS: Phosphorus: 3.4 mg/dL (ref 2.5–4.6)

## 2022-01-15 LAB — MAGNESIUM: Magnesium: 1.7 mg/dL (ref 1.7–2.4)

## 2022-01-15 LAB — LACTATE DEHYDROGENASE: LDH: 105 U/L (ref 98–192)

## 2022-01-15 LAB — C-REACTIVE PROTEIN: CRP: 9.4 mg/dL — ABNORMAL HIGH (ref <1.0)

## 2022-01-15 MED ORDER — LEVOTHYROXINE SODIUM 100 MCG/5ML IV SOLN: 84.0000 ug | Freq: Every day | INTRAVENOUS | Status: DC

## 2022-01-15 MED ORDER — ADULT MULTIVITAMIN W/MINERALS CH
1.0000 | ORAL_TABLET | Freq: Every day | ORAL | Status: DC
  Administered 2022-01-15 – 2022-01-25 (×11): 1 via ORAL
  Filled 2022-01-15 (×10): qty 1

## 2022-01-15 MED ORDER — LEVETIRACETAM IN NACL 500 MG/100ML IV SOLN: 500.0000 mg | Freq: Two times a day (BID) | INTRAVENOUS | Status: DC

## 2022-01-15 MED ORDER — MOLNUPIRAVIR EUA 200MG CAPSULE
4.0000 | ORAL_CAPSULE | Freq: Two times a day (BID) | ORAL | Status: DC
  Filled 2022-01-15: qty 4

## 2022-01-15 MED ORDER — SODIUM CHLORIDE 0.9 % IV SOLN
200.0000 mg | Freq: Once | INTRAVENOUS | Status: AC
  Administered 2022-01-15: 200 mg via INTRAVENOUS
  Filled 2022-01-15: qty 40

## 2022-01-15 MED ORDER — ENSURE ENLIVE PO LIQD
237.0000 mL | Freq: Two times a day (BID) | ORAL | Status: DC
  Administered 2022-01-16 – 2022-01-25 (×14): 237 mL via ORAL

## 2022-01-15 MED ORDER — SODIUM CHLORIDE 0.9 % IV SOLN
100.0000 mg | Freq: Every day | INTRAVENOUS | Status: AC
  Administered 2022-01-16 – 2022-01-17 (×2): 100 mg via INTRAVENOUS
  Filled 2022-01-15 (×2): qty 20

## 2022-01-15 MED ORDER — SODIUM CHLORIDE 0.9 % IV SOLN: INTRAVENOUS | Status: AC

## 2022-01-15 MED ORDER — SODIUM CHLORIDE 0.9 % IV SOLN
3.0000 g | Freq: Two times a day (BID) | INTRAVENOUS | Status: DC
  Administered 2022-01-15 – 2022-01-18 (×7): 3 g via INTRAVENOUS
  Filled 2022-01-15 (×9): qty 8

## 2022-01-15 MED ORDER — ALBUTEROL SULFATE HFA 108 (90 BASE) MCG/ACT IN AERS: 2.0000 | INHALATION_SPRAY | Freq: Four times a day (QID) | RESPIRATORY_TRACT | Status: DC | PRN

## 2022-01-15 NOTE — Consult Note (Signed)
Palliative Medicine Inpatient Consult Note  Consulting Provider: Mendel Corning, MD  Reason for consult:   [place] Palliative Medicine Consult  Reason for Consult? 88 year old female with COVID-19, superimposed pneumonia, aspiration, severe dehydration, confusion, needs GOC   01/15/2022  HPI:  Per intake H&P --> Patient is a 88 year old female with HTN, CKD stage II, chronic hyponatremia, hypothyroidism presented with worsening cough and mentation changes. Patient is from [place], independent living facility and had an unwitnessed fall on the morning of admission, fell on her left side.  Had diarrhea one week ago and have been in contacts with family who have [place]. Patient with covid 19 pneumonia. Palliative care requested for further [place] conversations.    Clinical Assessment/Goals of Care:  *Please note that this is a verbal dictation therefore any spelling or grammatical errors are due to the "[place]" system interpretation.  I have reviewed medical records including EPIC notes, labs and imaging, received report from bedside RN, assessed the patient.    I met with Bethany Blake and her daughter Bethany Blake to further discuss diagnosis prognosis, [place], EOL wishes, disposition and options.   I introduced Palliative Medicine as specialized medical care for people living with serious illness. It focuses on providing relief from the symptoms and stress of a serious illness. The goal is to improve quality of life for both the patient and the family.  Medical History Review and Understanding:  A brief review of Bethany Blake's comorbidities was had in the setting of her chronic kidney disease, hyponatremia, hypothyroidism, and hypertension.  I discussed with patient's daughter the series of events which led to hospitalization inclusive of diarrhea, falls, and newly identified COVID-19.  Social History:  Bethany Blake is from [place] originally though she has lived in both  [place] and [place] in her later years of life.  She is a widow and had been married for 84 years.  She has 3 children and 4 grandchildren.  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  She enjoys being outside and listening to the birds but does not enjoy it if it is windy.  Functional and Nutritional State:  Bethany Blake has been living at [facility] in their independent living arm.  She has caregivers from 9-1 and 4 to 7 PM.  She is able to mobilize with a rollator walker and has led a fairly functional life to date.  Bethany Blake does have a fair appetite though tends to not overindulge.     Advance Directives:  A detailed discussion was had today regarding advanced directives.  Bethany Blake's children are her surrogate decision makers.   Code Status: Concepts specific to code status, artifical feeding and hydration, continued IV antibiotics and rehospitalization was had.  The difference between a aggressive medical intervention path  and a palliative comfort care path for this patient at this time was had.   Patient is a DNAR/DNI code status.   Provided a MOST form for review and completion.  Provided "Hard Choices for [employer]" booklet.   Discussion:  I spoke to [place] and her daughter regarding her clinical situation and the setting of COVID-19 and aspiration pneumonia.  We discussed that the patient's goal at this time is for improvement to the point whereby she can go back to [place].  We discussed the best case and worst-case scenarios associated with COVID-19.  I shared that the goal is always for improvement but if for any reason think  should start to domino in a negative way solidifying what the wishes of Bethany Blake would be.  Per she and her daughter she would not want to suffer unnecessarily and if it came to a point where she was not going to recover the goals would be to transition to comfort focused care.  One of the issues at the time of  consultation is that the patient has COVID-19 and we are faced with decisions about how she can receive her viral medications.  There is the option of placing NG tube and put the medications through that versus giving the medications intravenously.  The patient is clear that she does not want a nasogastric tube and will prefer if we could give the medications through her veins.  Her daughter supports this and shares that she feels her mother will be miserable with an NG tube.  Plan to see how patient does in the oncoming days with current therapies.  Discussed the importance of continued conversation with family and their  medical providers regarding overall plan of care and treatment options, ensuring decisions are within the context of the patients values and GOCs.  Decision Maker: Bethany Blake,Bethany Blake (Daughter): [phone] (Mobile)  SUMMARY OF RECOMMENDATIONS   DNAR/DNI  Goal DNR in chart  A MOST form has been provided for patient and her daughters review and completion  Patient and her daughter would prefer to not have a nasogastric tube placed for nucleoside analog medication  Allow time for outcomes  Goals are for improvement  Ongoing palliative care support  Code Status/Advance Care Planning: DNAR/DNI  Palliative Prophylaxis:  Aspiration, Bowel Regimen, Delirium Protocol, Frequent Pain Assessment, Oral Care, Palliative Wound Care, and Turn Reposition  Additional Recommendations (Limitations, Scope, Preferences): Continue current care  Psycho-social/Spiritual:  Desire for further Chaplaincy support: Not presently though patient had been raised [facility] Additional Recommendations: Education on COVID-19 infection   Prognosis: Unclear at this time.  Discharge Planning: Discharge plan will depend upon PT and OT evaluations.  Vitals:   01/15/22 0600 01/15/22 0923  BP: 96/66   Pulse: 62 68  Resp: 18 16  Temp: 98.5 F (36.9 C)   SpO2: 95%     Intake/Output Summary  (Last 24 hours) at 01/15/2022 1139 Last data filed at 01/15/2022 0981 Gross per 24 hour  Intake 103.94 ml  Output 700 ml  Net -596.06 ml   Last Weight  Most recent update: 01/14/2022  6:26 PM    Weight  42.7 kg (94 lb 2.2 oz)            Gen: Frail elderly Caucasian female in no acute distress HEENT: moist mucous membranes CV: Regular rate and rhythm PULM: On room air breathing is even and nonlabored ABD: soft/nontender EXT: No edema Neuro: Alert and oriented x3 -quite hard of hearing  PPS: 40%   This conversation/these recommendations were discussed with patient primary care team, Dr. Tana Coast  Billing based on MDM: High  Problems Addressed: One acute or chronic illness or injury that poses a threat to life or bodily function  Amount and/or Complexity of Data: Category 3:Discussion of management or test interpretation with external physician/other qualified health care professional/appropriate source (not separately reported)  Risks: Decision regarding hospitalization or escalation of hospital care and Decision not to resuscitate or to de-escalate care because of poor prognosis ______________________________________________________ [place] Team Team Cell Phone: [phone] Please utilize secure chat with additional questions, if there is no response within 30 minutes please call the above phone  number  Palliative Medicine Team providers are available by phone from 7am to 7pm daily and can be reached through the team cell phone.  Should this patient require assistance outside of these hours, please call the patient's attending physician.

## 2022-01-15 NOTE — Progress Notes (Signed)
Pharmacy Antibiotic Note  Bethany Blake is a 88 y.o. female admitted on 01/14/2022 with possible aspiration pneumonia.  Pharmacy has been consulted for Unasyn dosing.  Plan: Unasyn 3 grams IV every 12 hours Monitor clinical progress, cultures/sensitivities, renal function, abx plan  Height: 5\' 4"  (162.6 cm) Weight: 42.7 kg (94 lb 2.2 oz) IBW/kg (Calculated) : 54.7  Temp (24hrs), Avg:98.6 F (37 C), Min:98.3 F (36.8 C), Max:98.8 F (37.1 C)  Recent Labs  Lab 01/14/22 1145 01/15/22 0414  WBC 6.2 2.6*  CREATININE 0.67 0.78    Estimated Creatinine Clearance: 26.5 mL/min (by C-G formula based on SCr of 0.78 mg/dL).    Allergies  Allergen Reactions   Ace Inhibitors Other (See Comments)    Severe hyponatremia   Aldactone [Spironolactone] Other (See Comments)    Severe hyponatremia   Angiotensin Receptor Blockers Other (See Comments)    Severe hyponatremia   Sulfa Antibiotics Rash    Antimicrobials this admission: 10/8 Unasyn >>  10/8 molnupiravir x 5 days   Dose adjustments this admission:   Microbiology results: 10/7 COVID positive   Thank you for allowing [place] to participate in this patients care. Jens Som, PharmD 01/15/2022 11:51 AM  **Pharmacist phone directory can be found on [email or web address] listed under [place]**

## 2022-01-15 NOTE — Progress Notes (Addendum)
Bethany Blake, is a 88 y.o. female, DOB - 01/20/1922, [place] date - 01/14/2022    Outpatient Primary MD for the patient is [name], MD  LOS - 1  days  Chief Complaint  Patient presents with   Fall       Brief summary   Patient is a 88 year old female with HTN, CKD stage II, chronic hyponatremia, hypothyroidism presented with worsening cough and mentation changes.  Patient is from [place], independent living facility and had an unwitnessed fall on the morning of admission, fell on her left side.    Per daughter, a week ago, she had 2 days of diarrhea, no abdominal pain, no fevers.  Over the weekend 2 family members visited the patient and both done positive for COVID on Monday 5 days prior to admission.  Patient without poor intake of food and fluid and developed episodic confusion, generalized weakness and had been sleeping more.  Subsequently started having cough, thick white to sputum, no fevers.  On the morning of admission, she was more confused than before.  COVID-19 test positive.  Trauma scan imaging is negative   Assessment & Plan    Principal Problem: Symptomatic COVID-19 pneumonitis -Presented with symptomatic COVID, generalized weakness, confusion, dehydration, aspirating -SLP evaluation done today, recommended dysphagia 1 diet with thin liquids -Placed on Decadron, Paxlovid, however unable to give Paxlovid due to aspiration (cannot be crushed) -Will place NG tube, start molnupiravir.  If that is not an option, will have to place on remdesivir IV for 3 days.   Active Problems:   Fall at home, initial encounter -No fractures or dislocation on the imagings.  -PT OT consult when able to tolerate  Aspiration pneumonia -Chest x-ray showed right-sided infrahilar opacities worrisome for pneumonia/aspiration -SLP evaluation done, recommended  dysphagia 1 pure diet with thin liquids -Placed on IV Unasyn  Severe dehydration -Likely due to poor oral intake, diarrhea -Placed on IV fluid hydration  UTI: UA positive for UTI, follow urine culture and sensitivities -She had received 1 dose of IV Rocephin, changed to IV Unasyn today to cover for aspiration pneumonia    Hypothyroidism -Continue Synthroid, changed to IV  Essential hypertension -BP currently soft, continue to hold antihypertensives  Acute encephalopathy -Likely due to COVID-19, severe dehydration, aspiration pneumonia -Continue to follow, will place palliative consult for GOC     Normocytic anemia -Baseline hemoglobin 8-10 -Hemoglobin 8.4, close to baseline     Stage II chronic kidney disease (CKD) (HCC) -Creatinine currently at baseline  Protein calorie malnutrition, hypoalbuminemia Estimated body mass index is 16.16 kg/m as calculated from the following:   Height as of this encounter: 5\' 4"  (1.626 m).   Weight as of this encounter: 42.7 kg.  Code Status: DNR DVT Prophylaxis:  enoxaparin (LOVENOX) injection 30 mg Start: 01/14/22 1615   Level of Care: Level of care: Telemetry Medical Family Communication:  Disposition Plan:      Remains inpatient appropriate: [id number], aspiration pneumonia   Procedures:  Multiple trauma imagings  Consultants:   Palliative medicine  Antimicrobials:   Anti-infectives (From admission, onward)    Start     Dose/Rate Route Frequency Ordered Stop   01/14/22 2200  nirmatrelvir/ritonavir EUA (PAXLOVID) 3 tablet        3 tablet Oral 2 times daily 01/14/22 1602 01/19/22 2159   01/14/22 1615  doxycycline (VIBRAMYCIN) 100 mg in sodium chloride 0.9 % 250 mL IVPB        100 mg 125 mL/hr over 120 Minutes Intravenous Every 12 hours 01/14/22 1602     01/14/22 1515  cefTRIAXone (ROCEPHIN) 1 g in sodium chloride 0.9 % 100 mL IVPB        1 g 200 mL/hr over 30 Minutes Intravenous STAT 01/14/22 1503 01/14/22 1644   01/14/22  1515  azithromycin (ZITHROMAX) 500 mg in sodium chloride 0.9 % 250 mL IVPB        500 mg 250 mL/hr over 60 Minutes Intravenous STAT 01/14/22 1503 01/14/22 1744          Medications  dexamethasone (DECADRON) injection  6 mg Intravenous Q24H   enoxaparin (LOVENOX) injection  30 mg Subcutaneous Q24H   levETIRAcetam  500 mg Oral BID   levothyroxine  112 mcg Oral q morning   nirmatrelvir/ritonavir EUA  3 tablet Oral BID      Subjective:   Bethany Blake was seen and examined today.  No fevers or chills, appears very deconditioned, somnolent.  No active nausea vomiting or diarrhea.  Objective:   Vitals:   01/14/22 1809 01/14/22 2137 01/15/22 0600 01/15/22 0923  BP: (!) 147/77 (!) 159/81 96/66   Pulse: 65 67 62 68  Resp: 18 18 18 16   Temp: 98.3 F (36.8 C) 98.6 F (37 C) 98.5 F (36.9 C)   TempSrc: Oral Oral Oral   SpO2: 97% 93% 95%   Weight: 42.7 kg     Height: 5\' 4"  (1.626 m)       Intake/Output Summary (Last 24 hours) at 01/15/2022 1113 Last data filed at 01/15/2022 [id number] Gross per 24 hour  Intake 103.94 ml  Output 700 ml  Net -596.06 ml     Wt Readings from Last 3 Encounters:  01/14/22 42.7 kg  06/20/21 58.8 kg  01/25/20 48.8 kg     Exam General: Somnolent but arousable, NAD Cardiovascular: S1 S2 auscultated,  RRR Respiratory: Mild scattered rhonchi R>L Gastrointestinal: Soft, nontender, nondistended, + bowel sounds Ext: no pedal edema bilaterally Neuro: does not follow commands Psych: somnolent but arousable    Data Reviewed:  I have personally reviewed following labs    CBC Lab Results  Component Value Date   WBC 2.6 (L) 01/15/2022   RBC 3.26 (L) 01/15/2022   HGB 8.4 (L) 01/15/2022   HCT 26.7 (L) 01/15/2022   MCV 81.9 01/15/2022   MCH 25.8 (L) 01/15/2022   PLT 330 01/15/2022   MCHC 31.5 01/15/2022   RDW 16.5 (H) 01/15/2022   LYMPHSABS 0.4 (L) 01/15/2022   MONOABS 0.1 01/15/2022   EOSABS 0.0 01/15/2022   BASOSABS 0.0 01/15/2022     Last  metabolic panel Lab Results  Component Value Date   NA 132 (L) 01/15/2022   K 4.2 01/15/2022   CL 97 (L) 01/15/2022   CO2 23 01/15/2022   BUN 12 01/15/2022   CREATININE 0.78 01/15/2022   GLUCOSE 102 (H) 01/15/2022   GFRNONAA >60 01/15/2022   GFRAA >60 01/03/2020   CALCIUM 8.6 (L) 01/15/2022   PHOS 3.4 01/15/2022  PROT 5.2 (L) 01/15/2022   ALBUMIN 2.4 (L) 01/15/2022   BILITOT 0.7 01/15/2022   ALKPHOS 39 01/15/2022   AST 15 01/15/2022   ALT 13 01/15/2022   ANIONGAP 12 01/15/2022    CBG (last 3)  Recent Labs    01/14/22 1143  GLUCAP 89      Coagulation Profile: No results for input(s): "INR", "PROTIME" in the last 168 hours.   Radiology Studies: I have personally reviewed the imaging studies  DG Chest 1 View  Result Date: 01/15/2022 CLINICAL DATA:  [id number]. Pneumonia follow-up. Question pulmonary edema or pleural effusion.  IMPRESSION: Worsening right infrahilar opacity worrisome for pneumonia or aspiration. Increased small right pleural effusion. Electronically Signed   By: Telford Nab M.D.   On: 01/15/2022 06:18   DG Elbow 2 Views Left  Result Date: 01/14/2022 IMPRESSION: Degenerative changes LEFT elbow joint. 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 Electronically Signed   By: Lavonia Dana M.D.   On: 01/14/2022 12:52   DG Hip Unilat W or Wo Pelvis 2-3 Views Left  Result Date: 01/14/2022  IMPRESSION: No acute fracture or dislocation identified about the left hip or pelvis. If occult hip fracture is suspected or if the patient is unable to weightbear, MRI is the preferred modality for further evaluation. Electronically Signed   By: Genevie Ann M.D.   On: 01/14/2022 11:27   DG Humerus Left  Result Date: 01/14/2022 IMPRESSION: 1. Osteopenia. No definite acute fracture or dislocation identified about the left humerus. 2. Suspect chronic resorption of bone from the proximal left radius since 2012. Electronically Signed   By: Genevie Ann M.D.    On: 01/14/2022 11:26   DG Chest 2 View  Result Date: 01/14/2022 IMPRESSION: 1. No acute cardiopulmonary abnormality. 2. Cardiomegaly and Aortic Atherosclerosis (ICD10-I70.0). 3. Osteopenia and spinal kyphoscoliosis. Electronically Signed   By: Genevie Ann M.D.   On: 01/14/2022 11:25   CT HEAD WO CONTRAST  Result Date: 01/14/2022 CLINICAL DATA:  Un witnessed fall this morning.IMPRESSION: HEAD CT 1. No acute intracranial abnormalities. CERVICAL CT 1. No fracture or acute finding. Electronically Signed   By: Lajean Manes M.D.   On: 01/14/2022 11:10   CT CERVICAL SPINE WO CONTRAST  Result Date: 01/14/2022 CLINICAL DATA:  Un witnessed fall this morning. IMPRESSION: HEAD CT 1. No acute intracranial abnormalities. CERVICAL CT 1. No fracture or acute finding. Electronically Signed   By: Lajean Manes M.D.   On: 01/14/2022 11:10       Mechel Haggard M.D. [facility] 01/15/2022, 11:13 AM  Available via Epic secure chat 7am-7pm After 7 pm, please refer to night coverage provider listed on amion.

## 2022-01-15 NOTE — Evaluation (Signed)
Clinical/Bedside Swallow Evaluation Patient Details  Name: Bethany Blake MRN: [id number] Date of Birth: 06-23-1921  Today's Date: 01/15/2022 Time: SLP Start Time (ACUTE ONLY): 1015 SLP Stop Time (ACUTE ONLY): 1037 SLP Time Calculation (min) (ACUTE ONLY): 22 min  Past Medical History:  Past Medical History:  Diagnosis Date   Anxiety    Arthritis    Chronic kidney disease    Hypertension    Hypokalemia    Hyponatremia    Hypothyroidism    Past Surgical History:  Past Surgical History:  Procedure Laterality Date   SHOULDER SURGERY     WRIST SURGERY     HPI:  Pt is a 88 y.o. female who was sent from facility for evaluation of worsening of cough and mentation changes. CXR negative. Dx COVID-19, acute metaboloic encephalopathy. PMH: dementia, HTN, CKD stage II, chronic hyponatremia, hypothyroidism.    Assessment / Plan / Recommendation  Clinical Impression  Pt was seen for bedside swallow evaluation with her daughter present. Pt's daughter reported that the pt has been consuming a regular texture diet. She stated that the pt was able to eat independently in the facility's dining room with friends two weeks prior. However, she reported that, when the pt is in her room, she has been observed to chew for a long time (even for the whole day), spit foods out, or "stuff her mouth" and continue to add food. Oral mechanism exam was [facility] and she presented with full dentures. Pt exhibited symptoms of oral phase dysphagia which are likely cognitively based. She exhibited reduced bolus awareness, oral holding, prolonged bolus manipulation and mastication, and frequent spitting out of foods which required mastication. Pt required prompts and multiple liquid washes to swallow even dysphagia 2 solids. Mild throat clearing was noted with the initial bolus of thin liquids via cup, but no other s/s of aspiration were noted with solids or liquids. A dysphagia 1 diet with thin liquids is recommended; SLP will follow  to ensure tolerance, and for advancement as clinically indicated. SLP Visit Diagnosis: Dysphagia, unspecified (R13.10)    Aspiration Risk  Mild aspiration risk    Diet Recommendation Dysphagia 1 (Puree);Thin liquid   Liquid Administration via: Cup;Straw Medication Administration: Whole meds with liquid Supervision: Patient able to self feed Compensations: Minimize environmental distractions;Small sips/bites Postural Changes: Seated upright at 90 degrees    Other  Recommendations Oral Care Recommendations: Oral care BID    Recommendations for follow up therapy are one component of a multi-disciplinary discharge planning process, led by the attending physician.  Recommendations may be updated based on patient status, additional functional criteria and insurance authorization.  Follow up Recommendations  (TBD)      Assistance Recommended at Discharge Frequent or constant Supervision/Assistance  Functional Status Assessment Patient has had a recent decline in their functional status and demonstrates the ability to make significant improvements in function in a reasonable and predictable amount of time.  Frequency and Duration min 2x/week  2 weeks       Prognosis Prognosis for Safe Diet Advancement: Good Barriers to Reach Goals: Cognitive deficits      Swallow Study   General Date of Onset: 01/14/22 HPI: Pt is a 88 y.o. female who was sent from facility for evaluation of worsening of cough and mentation changes. CXR negative. Dx COVID-19, acute metaboloic encephalopathy. PMH: dementia, HTN, CKD stage II, chronic hyponatremia, hypothyroidism. Type of Study: Bedside Swallow Evaluation Previous Swallow Assessment: none Diet Prior to this Study: NPO Temperature Spikes Noted: No Respiratory  Status: Room air History of Recent Intubation: No Behavior/Cognition: Alert;Cooperative;Pleasant mood Oral Cavity Assessment: Within Functional Limits Oral Care Completed by SLP: Recent  completion by staff Oral Cavity - Dentition: Dentures, top;Dentures, bottom Self-Feeding Abilities: Needs assist Patient Positioning: Upright in bed;Postural control adequate for testing Baseline Vocal Quality: Low vocal intensity Volitional Cough: Congested;Weak Volitional Swallow: Able to elicit    Oral/Motor/Sensory Function Overall Oral Motor/Sensory Function: Within functional limits   Ice Chips Ice chips: Impaired Presentation: Spoon Oral Phase Functional Implications:  (spat out boluses)   Thin Liquid Thin Liquid: Impaired Presentation: Cup;Spoon Oral Phase Impairments: Poor awareness of bolus Oral Phase Functional Implications: Oral holding    Nectar Thick Nectar Thick Liquid: Not tested   Honey Thick Honey Thick Liquid: Not tested   Puree Puree: Impaired Presentation: Spoon Oral Phase Impairments: Poor awareness of bolus Oral Phase Functional Implications: Oral holding;Prolonged oral transit   Solid     Solid: Impaired Presentation: Spoon Oral Phase Impairments: Impaired mastication;Poor awareness of bolus Oral Phase Functional Implications: Impaired mastication;Oral holding;Prolonged oral transit     Bethany Blake I. Bethany Blake, [place], [place] Office number [phone]  Bethany Blake 01/15/2022,10:58 AM

## 2022-01-15 NOTE — Progress Notes (Signed)
Initial Nutrition Assessment  DOCUMENTATION CODES:   Underweight  INTERVENTION:   Multivitamin w/ minerals daily Ensure Enlive po BID, each supplement provides 350 kcal and 20 grams of protein. Encourage good PO intake   NUTRITION DIAGNOSIS:   Increased nutrient needs related to acute illness (COVID) as evidenced by estimated needs.  GOAL:   Patient will meet greater than or equal to 90% of their needs  MONITOR:   PO intake, Supplement acceptance, Labs, Weight trends  REASON FOR ASSESSMENT:   Consult Assessment of nutrition requirement/status  ASSESSMENT:   88 y.o. female presented to the ED with cough and mentation changes. PMH includes CKD IIIA, SDH, and HTN. Pt admitted with acute metabolic encephalopathy, COVID, and severe dehydration.   10/08 - SLP eval, recommend Dysphagia 1   RD working remotely at time of assessment. Per palliative note, pt does not wish to have NG tube placed and prefers medication to be administered IV as able. Discussed with MD, RD to discontinue Cortrak consult.  No meal completions have been documented.   Per EMR, pt has had 27% weight loss within 7 months, this is clinically significant and severe for time frame. Suspect that pt has malnutrition but unable to diagnosis at this time due to inability to preform nutrition focused physical exam.   RD to order pt nutritional supplements to assist with poor PO intake and significant wt loss.    Medications reviewed and include: Decadron, IV antibiotics  Labs reviewed: Sodium 132, CRP 9.4   NUTRITION - FOCUSED PHYSICAL EXAM:  Deferred to follow-up.   Diet Order:   Diet Order             DIET - DYS 1 Room service appropriate? No; Fluid consistency: Thin  Diet effective now                   EDUCATION NEEDS:   No education needs have been identified at this time  Skin:  Skin Assessment: Reviewed RN Assessment  Last BM:  Unknown  Height:   Ht Readings from Last 1  Encounters:  01/14/22 5\' 4"  (1.626 m)    Weight:   Wt Readings from Last 1 Encounters:  01/14/22 42.7 kg    Ideal Body Weight:  54.6 kg  BMI:  Body mass index is 16.16 kg/m.  Estimated Nutritional Needs:   Kcal:  1400-1600  Protein:  70-85 grams  Fluid:  >/= 1.5 L    Hermina Barters RD, LDN Clinical Dietitian See [facility] for contact information.

## 2022-01-15 NOTE — Evaluation (Signed)
Physical Therapy Evaluation Patient Details Name: Bethany Blake MRN: [id number] DOB: Jul 21, 1921 Today's Date: 01/15/2022  History of Present Illness  pt is a 88 y/o female admitted 10/7 for worsening cough and mentation changes.  Daughter reports recent loose bowels/diarrhea plus recent visits from family members found to have COVID, and episodic confusion with general weakness.  PMHx, CKD, HTN, arthritis  Clinical Impression  Pt admitted with/for the events stated above leading to weakness, confusion and cough.  Pt needing moderate assist for limited/basic mobility, pt not feeling up to much..  Pt currently limited functionally due to the problems listed. ( See problems list.)   Pt will benefit from PT to maximize function and safety in order to get ready for next venue listed below.        Recommendations for follow up therapy are one component of a multi-disciplinary discharge planning process, led by the attending physician.  Recommendations may be updated based on patient status, additional functional criteria and insurance authorization.  Follow Up Recommendations Skilled nursing-short term rehab (<3 hours/day) (pt may refuse and go home, may progress well and be appropriate for home with HHPT) Can patient physically be transported by private vehicle: No    Assistance Recommended at Discharge Intermittent Supervision/Assistance  Patient can return home with the following  A little help with walking and/or transfers;A little help with bathing/dressing/bathroom;Assistance with cooking/housework;Assist for transportation;Help with stairs or ramp for entrance;Direct supervision/assist for medications management;Direct supervision/assist for financial management    Equipment Recommendations None recommended by PT;Other (comment) (TBD)  Recommendations for Other Services       Functional Status Assessment Patient has had a recent decline in their functional status and demonstrates the ability  to make significant improvements in function in a reasonable and predictable amount of time.     Precautions / Restrictions Precautions Precautions: Fall      Mobility  Bed Mobility Overal bed mobility: Needs Assistance Bed Mobility: Supine to Sit, Sit to Supine     Supine to sit: Mod assist Sit to supine: Mod assist   General bed mobility comments: cues for direction, truncal assist for transition to sitting , padding and truncal assist from behind for scooting assist, pt with tendency to list moderately posteriorly.    Transfers Overall transfer level: Needs assistance                 General transfer comment: pt deferred standing without trying  "I'm just not ready.    Ambulation/Gait                  Stairs            Wheelchair Mobility    Modified Rankin (Stroke Patients Only)       Balance Overall balance assessment: Needs assistance Sitting-balance support: Single extremity supported, Bilateral upper extremity supported, Feet supported Sitting balance-Leahy Scale: Poor Sitting balance - Comments: posterior bias, needing external support today, where usually not needed.                                     Pertinent Vitals/Pain Pain Assessment Pain Assessment: Faces Faces Pain Scale: No hurt Pain Intervention(s): Monitored during session    Home Living Family/patient expects to be discharged to:: Private residence Living Arrangements: Alone Available Help at Discharge: Personal care attendant;Family;Available PRN/intermittently Type of Home: Independent living facility ([place]) Home Access: Level entry  Home Layout: One level Home Equipment: Rollator (4 wheels);Shower seat Additional Comments: lives at [facility]. Has assistance for  meals and meds  9-1 then 4-6, daughter in and out, does not have 24/7    Prior Function Prior Level of Function : Needs assist;Other  (comment) (but mod I with some basic mobility/ toileting.)             Mobility Comments: ambulates with rollator, is alone most of the day and night ADLs Comments: has assistance with bath and dressing(from dtr), toileting independently(per dtr does not get herself clean after a bowel movement)     Hand Dominance   Dominant Hand: Right    Extremity/Trunk Assessment   Upper Extremity Assessment Upper Extremity Assessment: Generalized weakness    Lower Extremity Assessment Lower Extremity Assessment: Generalized weakness    Cervical / Trunk Assessment Cervical / Trunk Assessment: Kyphotic  Communication   Communication: HOH  Cognition Arousal/Alertness: Awake/alert Behavior During Therapy: Flat affect Overall Cognitive Status: Difficult to assess                                          General Comments General comments (skin integrity, edema, etc.): vss,  daughter present and assisting.    Exercises Other Exercises Other Exercises: warm up ROM,   Assessment/Plan    PT Assessment Patient needs continued PT services  PT Problem List Decreased strength;Decreased activity tolerance;Decreased balance;Decreased mobility;Decreased knowledge of use of DME;Decreased safety awareness;Cardiopulmonary status limiting activity       PT Treatment Interventions DME instruction;Gait training;Functional mobility training;Therapeutic activities;Balance training;Patient/family education    PT Goals (Current goals can be found in the Care Plan section)  Acute Rehab PT Goals Patient Stated Goal: I just don't want to do anything today.   We'll see if I do something next time. PT Goal Formulation: With patient/family Time For Goal Achievement: 01/29/22 Potential to Achieve Goals: Good    Frequency Min 3X/week     Co-evaluation               AM-PAC PT "6 Clicks" Mobility  Outcome Measure Help needed turning from your back to your side while in a flat  bed without using bedrails?: A Lot Help needed moving from lying on your back to sitting on the side of a flat bed without using bedrails?: A Lot Help needed moving to and from a bed to a chair (including a wheelchair)?: A Lot Help needed standing up from a chair using your arms (e.g., wheelchair or bedside chair)?: A Lot Help needed to walk in hospital room?: A Lot Help needed climbing 3-5 steps with a railing? : A Lot 6 Click Score: 12    End of Session Equipment Utilized During Treatment: Oxygen Activity Tolerance: Patient limited by fatigue Patient left: in bed;with call bell/phone within reach;with bed alarm set;with family/visitor present Nurse Communication: Mobility status PT Visit Diagnosis: Muscle weakness (generalized) (M62.81)    Time: 1230-1250 PT Time Calculation (min) (ACUTE ONLY): 20 min   Charges:   PT Evaluation $PT Eval Moderate Complexity: 1 Mod          01/15/2022  Ginger Carne., PT Acute Rehabilitation Services [phone]  (office)  Tessie Fass Arora Coakley 01/15/2022, 1:35 PM

## 2022-01-16 DIAGNOSIS — I10 Essential (primary) hypertension: Secondary | ICD-10-CM | POA: Diagnosis not present

## 2022-01-16 DIAGNOSIS — U071 COVID-19: Secondary | ICD-10-CM | POA: Diagnosis not present

## 2022-01-16 DIAGNOSIS — G9340 Encephalopathy, unspecified: Secondary | ICD-10-CM | POA: Diagnosis not present

## 2022-01-16 LAB — CBC WITH DIFFERENTIAL/PLATELET
Abs Immature Granulocytes: 0.02 10*3/uL (ref 0.00–0.07)
Basophils Absolute: 0 10*3/uL (ref 0.0–0.1)
Basophils Relative: 0 %
Eosinophils Absolute: 0 10*3/uL (ref 0.0–0.5)
Eosinophils Relative: 0 %
HCT: 28.1 % — ABNORMAL LOW (ref 36.0–46.0)
Hemoglobin: 8.7 g/dL — ABNORMAL LOW (ref 12.0–15.0)
Immature Granulocytes: 1 %
Lymphocytes Relative: 15 %
Lymphs Abs: 0.6 10*3/uL — ABNORMAL LOW (ref 0.7–4.0)
MCH: 25.1 pg — ABNORMAL LOW (ref 26.0–34.0)
MCHC: 31 g/dL (ref 30.0–36.0)
MCV: 81.2 fL (ref 80.0–100.0)
Monocytes Absolute: 0.1 10*3/uL (ref 0.1–1.0)
Monocytes Relative: 2 %
Neutro Abs: 3.5 10*3/uL (ref 1.7–7.7)
Neutrophils Relative %: 82 %
Platelets: 389 10*3/uL (ref 150–400)
RBC: 3.46 MIL/uL — ABNORMAL LOW (ref 3.87–5.11)
RDW: 16.9 % — ABNORMAL HIGH (ref 11.5–15.5)
WBC: 4.3 10*3/uL (ref 4.0–10.5)
nRBC: 0 % (ref 0.0–0.2)

## 2022-01-16 LAB — FERRITIN: Ferritin: 17 ng/mL (ref 11–307)

## 2022-01-16 LAB — COMPREHENSIVE METABOLIC PANEL
ALT: 11 U/L (ref 0–44)
AST: 13 U/L — ABNORMAL LOW (ref 15–41)
Albumin: 2.3 g/dL — ABNORMAL LOW (ref 3.5–5.0)
Alkaline Phosphatase: 38 U/L (ref 38–126)
Anion gap: 11 (ref 5–15)
BUN: 17 mg/dL (ref 8–23)
CO2: 22 mmol/L (ref 22–32)
Calcium: 8.5 mg/dL — ABNORMAL LOW (ref 8.9–10.3)
Chloride: 103 mmol/L (ref 98–111)
Creatinine, Ser: 0.78 mg/dL (ref 0.44–1.00)
GFR, Estimated: >60 mL/min (ref >60)
Glucose, Bld: 129 mg/dL — ABNORMAL HIGH (ref 70–99)
Potassium: 4.4 mmol/L (ref 3.5–5.1)
Sodium: 136 mmol/L (ref 135–145)
Total Bilirubin: 0.3 mg/dL (ref 0.3–1.2)
Total Protein: 5 g/dL — ABNORMAL LOW (ref 6.5–8.1)

## 2022-01-16 LAB — MAGNESIUM: Magnesium: 1.6 mg/dL — ABNORMAL LOW (ref 1.7–2.4)

## 2022-01-16 LAB — C-REACTIVE PROTEIN: CRP: 5.7 mg/dL — ABNORMAL HIGH (ref <1.0)

## 2022-01-16 LAB — T3: T3, Total: 25 ng/dL — ABNORMAL LOW (ref 71–180)

## 2022-01-16 LAB — PHOSPHORUS: Phosphorus: 2.8 mg/dL (ref 2.5–4.6)

## 2022-01-16 LAB — D-DIMER, QUANTITATIVE: D-Dimer, Quant: 3.69 ug/mL-FEU — ABNORMAL HIGH (ref 0.00–0.50)

## 2022-01-16 MED ORDER — LEVOTHYROXINE SODIUM 100 MCG/5ML IV SOLN: 84.0000 ug | Freq: Every day | INTRAVENOUS | Status: DC

## 2022-01-16 MED ORDER — MAGNESIUM SULFATE 50 % IJ SOLN
3.0000 g | Freq: Once | INTRAVENOUS | Status: AC
  Administered 2022-01-16: 3 g via INTRAVENOUS
  Filled 2022-01-16: qty 6

## 2022-01-16 NOTE — Progress Notes (Signed)
CSW acknowledges SNF consult and will follow for medical readiness and COVID precautions.   Gilmore Laroche, MSW, [facility]

## 2022-01-16 NOTE — Evaluation (Signed)
Occupational Therapy Evaluation Patient Details Name: Bethany Blake MRN: [id number] DOB: Sep 27, 1921 Today's Date: 01/16/2022   History of Present Illness pt is a 88 y/o female admitted 10/7 for worsening cough and mentation changes.  Daughter reports recent loose bowels/diarrhea plus recent visits from family members found to have COVID, and episodic confusion with general weakness. +COVID  PMHx, CKD, HTN, arthritis   Clinical Impression   Pt currently presents at a mod assist level for selfcare tasks simulated as well as transfers with use of her rollator.  Feel she will benefit from acute care OT to help increase balance and safety for greater participation in selfcare tasks.  Feel she will need 24 hr supervision/assist at discharge for safety.          Recommendations for follow up therapy are one component of a multi-disciplinary discharge planning process, led by the attending physician.  Recommendations may be updated based on patient status, additional functional criteria and insurance authorization.   Follow Up Recommendations  Skilled nursing-short term rehab (<3 hours/day)    Assistance Recommended at Discharge Frequent or constant Supervision/Assistance  Patient can return home with the following A little help with walking and/or transfers;A lot of help with bathing/dressing/bathroom;Assistance with cooking/housework;Assist for transportation;Direct supervision/assist for medications management;Direct supervision/assist for financial management    Functional Status Assessment  Patient has had a recent decline in their functional status and demonstrates the ability to make significant improvements in function in a reasonable and predictable amount of time.  Equipment Recommendations  None recommended by OT       Precautions / Restrictions Precautions Precautions: Fall Restrictions Weight Bearing Restrictions: No      Mobility Bed Mobility Overal bed mobility: Needs  Assistance Bed Mobility: Supine to Sit     Supine to sit: Mod assist     General bed mobility comments: Min assist for bringing trunk up to sitting with mod asssit for scooting forward out to the EOB.    Transfers Overall transfer level: Needs assistance Equipment used: Rollator (4 wheels) Transfers: Sit to/from Stand Sit to Stand: Mod assist                  Balance Overall balance assessment: Needs assistance   Sitting balance-Leahy Scale: Poor Sitting balance - Comments: posterior bias, needing external support today, where usually not needed.   Standing balance support: Bilateral upper extremity supported, Reliant on assistive device for balance Standing balance-Leahy Scale: Poor Standing balance comment: Pt needs BUE support for standing balance                           ADL either performed or assessed with clinical judgement   ADL Overall ADL's : Needs assistance/impaired Eating/Feeding: Set up;Sitting   Grooming: Wash/dry face;Wash/dry hands;Set up;Sitting                   Toilet Transfer: Ambulation;BSC/3in1;Moderate assistance Toilet Transfer Details (indicate cue type and reason): simulated, pt declined need to toilet Toileting- Clothing Manipulation and Hygiene: Minimal assistance;Sit to/from stand       Functional mobility during ADLs: Rollator (4 wheels);Moderate assistance General ADL Comments: Pt reports having someone assist her with bathing and dressing and that she would walk to a dinning area.  No family present to confirm but currently needs min assist for sit to stand and balance with use of the rollator.  Will need 24 hr supervision at discharge for safety.  Vision Baseline Vision/History: 0 No visual deficits Ability to See in Adequate Light: 0 Adequate Patient Visual Report: No change from baseline Vision Assessment?: No apparent visual deficits     Perception Perception Perception: Not tested   Praxis  Praxis Praxis: Not tested    Pertinent Vitals/Pain Pain Assessment Pain Assessment: Faces Faces Pain Scale: No hurt     Hand Dominance Right   Extremity/Trunk Assessment Upper Extremity Assessment Upper Extremity Assessment: Generalized weakness (not formally assessed, but not arthiritic changes in digits secondary to arthritis)   Lower Extremity Assessment Lower Extremity Assessment: Defer to PT evaluation   Cervical / Trunk Assessment Cervical / Trunk Assessment: Kyphotic   Communication Communication Communication: HOH   Cognition Arousal/Alertness: Awake/alert Behavior During Therapy: Flat affect Overall Cognitive Status: No family/caregiver present to determine baseline cognitive functioning                                 General Comments: Pt not oriented to place, day of the week, or situation.  Not able to state reason for hospitalization.                Home Living Family/patient expects to be discharged to:: Private residence Living Arrangements: Alone Available Help at Discharge: Personal care attendant;Family;Available PRN/intermittently Type of Home: Independent living facility [facility]) Home Access: Level entry     Home Layout: One level     Bathroom Shower/Tub: Producer, television/film/video: Handicapped height     Home Equipment: Rollator (4 wheels);Shower seat   Additional Comments: lives at [facility] living. Has assistance for  meals and meds  9-1 then 4-6, daughter in and out, does not have 24/7      Prior Functioning/Environment Prior Level of Function : Needs assist;Other (comment) (but mod I with some basic mobility/ toileting.)             Mobility Comments: ambulates with rollator, is alone most of the day and night ADLs Comments: has assistance with bath and dressing(from dtr), toileting independently(per dtr does not get herself clean after a bowel movement)        OT Problem  List: Decreased strength;Impaired balance (sitting and/or standing);Decreased safety awareness;Decreased cognition      OT Treatment/Interventions: Self-care/ADL training;Therapeutic activities;Patient/family education;DME and/or AE instruction;Balance training    OT Goals(Current goals can be found in the care plan section) Acute Rehab OT Goals Patient Stated Goal: Pt did not state but agreeable to work with therapists. OT Goal Formulation: With patient Time For Goal Achievement: 01/30/22 Potential to Achieve Goals: Good  OT Frequency: Min 2X/week    Co-evaluation PT/OT/SLP Co-Evaluation/Treatment: Yes Reason for Co-Treatment: For patient/therapist safety PT goals addressed during session: Mobility/safety with mobility;Proper use of DME OT goals addressed during session: ADL's and self-care      AM-PAC OT "6 Clicks" Daily Activity     Outcome Measure Help from another person eating meals?: None Help from another person taking care of personal grooming?: A Little Help from another person toileting, which includes using toliet, bedpan, or urinal?: A Lot Help from another person bathing (including washing, rinsing, drying)?: A Lot Help from another person to put on and taking off regular upper body clothing?: A Little Help from another person to put on and taking off regular lower body clothing?: A Lot 6 Click Score: 16   End of Session Equipment Utilized During Treatment: Gait belt;Rollator (4 wheels)  Activity Tolerance: Patient tolerated treatment well Patient left: in chair;with call bell/phone within reach;with chair alarm set  OT Visit Diagnosis: Unsteadiness on feet (R26.81);Muscle weakness (generalized) (M62.81);Other symptoms and signs involving cognitive function                Time: 8786-7672 OT Time Calculation (min): 30 min Charges:  OT General Charges $OT Visit: 1 Visit OT Evaluation $OT Eval Moderate Complexity: 1 Mod  Kaydn Kumpf OTR/L 01/16/2022, 4:38 PM

## 2022-01-16 NOTE — Progress Notes (Addendum)
Bethany Blake, is a 88 y.o. female, DOB - 1922/01/13, [place] date - 01/14/2022    Outpatient Primary MD for the patient is [name], MD  LOS - 2  days  Chief Complaint  Patient presents with   Fall       Brief summary   Patient is a 88 year old female with HTN, CKD stage II, chronic hyponatremia, hypothyroidism presented with worsening cough and mentation changes.  Patient is from [place], independent living facility and had an unwitnessed fall on the morning of admission, fell on her left side.    Per daughter, a week ago, she had 2 days of diarrhea, no abdominal pain, no fevers.  Over the weekend 2 family members visited the patient and both done positive for COVID on Monday 5 days prior to admission.  Patient without poor intake of food and fluid and developed episodic confusion, generalized weakness and had been sleeping more.  Subsequently started having cough, thick white to sputum, no fevers.  On the morning of admission, she was more confused than before.  COVID-19 test positive.  Trauma scan imaging is negative   Assessment & Plan    Principal Problem: Symptomatic COVID-19 pneumonitis -Presented with symptomatic COVID, generalized weakness, confusion, dehydration, aspirating -SLP evaluation 10/8 recommended dysphagia 1 diet with thin liquids -Placed on IV Decadron, however not able to give Paxlovid or molnupiravir p.o. daughter declined NGT  -Started on IV remdesivir, CRP improving  Active Problems:   Fall at home, initial encounter -No fractures or dislocation on the imagings.  -PT recommended SNF  Aspiration pneumonia -Chest x-ray showed right-sided infrahilar opacities worrisome for pneumonia/aspiration -SLP evaluation recommended dysphagia 1 pure diet with thin liquids  -continue IV Unasyn  Severe dehydration -Likely due to poor oral  intake, diarrhea -Placed on IV fluid hydration  UTI: UA positive for UTI, follow urine culture and sensitivities -cont IV Unasyn    Hypothyroidism -Continue Synthroid, changed to IV  Essential hypertension -BP now stable, placed on IV hydralazine as needed with parameters  Acute encephalopathy -Likely due to COVID-19, severe dehydration, aspiration pneumonia -Improving, palliative medicine following   Normocytic anemia -Baseline hemoglobin 8-10 -Hemoglobin 8.7, at baseline  Hypomagnesemia -Replaced IV    Stage II chronic kidney disease (CKD) (HCC) -Creatinine currently at baseline  Protein calorie malnutrition, hypoalbuminemia Estimated body mass index is 16.16 kg/m as calculated from the following:   Height as of this encounter: 5\' 4"  (1.626 m).   Weight as of this encounter: 42.7 kg.  Code Status: DNR DVT Prophylaxis:  enoxaparin (LOVENOX) injection 30 mg Start: 01/14/22 1615   Level of Care: Level of care: Telemetry Medical Family Communication: called pt's daughter, Ms Lynnette Caffey and updated on the phone  Disposition Plan:      Remains inpatient appropriate: COVID-19, aspiration pneumonia   Procedures:  Multiple trauma imagings  Consultants:   Palliative medicine  Antimicrobials:   Anti-infectives (From admission, onward)    Start     Dose/Rate Route Frequency  Ordered Stop   01/16/22 1000  remdesivir 100 mg in sodium chloride 0.9 % 100 mL IVPB       See Hyperspace for full Linked Orders Report.   100 mg 200 mL/hr over 30 Minutes Intravenous Daily 01/15/22 1223 01/18/22 0959   01/15/22 1315  remdesivir 200 mg in sodium chloride 0.9% 250 mL IVPB       See Hyperspace for full Linked Orders Report.   200 mg 580 mL/hr over 30 Minutes Intravenous Once 01/15/22 1223 01/15/22 1526   01/15/22 1245  Ampicillin-Sulbactam (UNASYN) 3 g in sodium chloride 0.9 % 100 mL IVPB        3 g 200 mL/hr over 30 Minutes Intravenous Every 12 hours 01/15/22 1147     01/15/22 1230   molnupiravir EUA (LAGEVRIO) capsule 800 mg  Status:  Discontinued        4 capsule Oral 2 times daily 01/15/22 1131 01/15/22 1220   01/14/22 2200  nirmatrelvir/ritonavir EUA (PAXLOVID) 3 tablet  Status:  Discontinued        3 tablet Oral 2 times daily 01/14/22 1602 01/15/22 1131   01/14/22 1615  doxycycline (VIBRAMYCIN) 100 mg in sodium chloride 0.9 % 250 mL IVPB  Status:  Discontinued        100 mg 125 mL/hr over 120 Minutes Intravenous Every 12 hours 01/14/22 1602 01/15/22 1113   01/14/22 1515  cefTRIAXone (ROCEPHIN) 1 g in sodium chloride 0.9 % 100 mL IVPB        1 g 200 mL/hr over 30 Minutes Intravenous STAT 01/14/22 1503 01/14/22 1644   01/14/22 1515  azithromycin (ZITHROMAX) 500 mg in sodium chloride 0.9 % 250 mL IVPB        500 mg 250 mL/hr over 60 Minutes Intravenous STAT 01/14/22 1503 01/14/22 1744          Medications  dexamethasone (DECADRON) injection  6 mg Intravenous Q24H   enoxaparin (LOVENOX) injection  30 mg Subcutaneous Q24H   feeding supplement  237 mL Oral BID BM   [START ON 01/23/2022] levothyroxine  84 mcg Intravenous Daily   multivitamin with minerals  1 tablet Oral Daily      Subjective:   Bethany Blake was seen and examined today.  No acute issues overnight, BP now stable.  No fevers or chills.  No active nausea vomiting or diarrhea  Objective:   Vitals:   01/15/22 0923 01/15/22 1611 01/15/22 2041 01/16/22 0522  BP:  (!) 152/69 (!) 152/94 (!) 167/63  Pulse: 68 73 66 65  Resp: 16 14 18 18   Temp:  98 F (36.7 C) 98.2 F (36.8 C) 97.9 F (36.6 C)  TempSrc:  Oral Oral Oral  SpO2:  94% 95% 97%  Weight:      Height:        Intake/Output Summary (Last 24 hours) at 01/16/2022 1205 Last data filed at 01/16/2022 0531 Gross per 24 hour  Intake 833.88 ml  Output 200 ml  Net 633.88 ml     Wt Readings from Last 3 Encounters:  01/14/22 42.7 kg  06/20/21 58.8 kg  01/25/20 48.8 kg   Physical Exam General: Alert and oriented x 3,  NAD Cardiovascular: S1 S2 clear, RRR.  Respiratory: dec BS at bases  Gastrointestinal: Soft, nontender, nondistended, NBS Ext: no pedal edema bilaterally Neuro: no new deficits Psych: Normal affect     Data Reviewed:  I have personally reviewed following labs    CBC Lab Results  Component Value Date  WBC 4.3 01/16/2022   RBC 3.46 (L) 01/16/2022   HGB 8.7 (L) 01/16/2022   HCT 28.1 (L) 01/16/2022   MCV 81.2 01/16/2022   MCH 25.1 (L) 01/16/2022   PLT 389 01/16/2022   MCHC 31.0 01/16/2022   RDW 16.9 (H) 01/16/2022   LYMPHSABS 0.6 (L) 01/16/2022   MONOABS 0.1 01/16/2022   EOSABS 0.0 01/16/2022   BASOSABS 0.0 31/51/7616     Last metabolic panel Lab Results  Component Value Date   NA 136 01/16/2022   K 4.4 01/16/2022   CL 103 01/16/2022   CO2 22 01/16/2022   BUN 17 01/16/2022   CREATININE 0.78 01/16/2022   GLUCOSE 129 (H) 01/16/2022   GFRNONAA >60 01/16/2022   GFRAA >60 01/03/2020   CALCIUM 8.5 (L) 01/16/2022   PHOS 2.8 01/16/2022   PROT 5.0 (L) 01/16/2022   ALBUMIN 2.3 (L) 01/16/2022   BILITOT 0.3 01/16/2022   ALKPHOS 38 01/16/2022   AST 13 (L) 01/16/2022   ALT 11 01/16/2022   ANIONGAP 11 01/16/2022    CBG (last 3)  Recent Labs    01/14/22 1143  GLUCAP 89      Coagulation Profile: No results for input(s): "INR", "PROTIME" in the last 168 hours.   Radiology Studies: I have personally reviewed the imaging studies  DG Chest 1 View  Result Date: 01/15/2022 CLINICAL DATA:  [id number]. Pneumonia follow-up. Question pulmonary edema or pleural effusion.  IMPRESSION: Worsening right infrahilar opacity worrisome for pneumonia or aspiration. Increased small right pleural effusion. Electronically Signed   By: Telford Nab M.D.   On: 01/15/2022 06:18   DG Elbow 2 Views Left  Result Date: 01/14/2022 IMPRESSION: Degenerative changes LEFT elbow joint. 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 Electronically Signed   By: Lavonia Dana  M.D.   On: 01/14/2022 12:52   DG Hip Unilat W or Wo Pelvis 2-3 Views Left  Result Date: 01/14/2022  IMPRESSION: No acute fracture or dislocation identified about the left hip or pelvis. If occult hip fracture is suspected or if the patient is unable to weightbear, MRI is the preferred modality for further evaluation. Electronically Signed   By: Genevie Ann M.D.   On: 01/14/2022 11:27   DG Humerus Left  Result Date: 01/14/2022 IMPRESSION: 1. Osteopenia. No definite acute fracture or dislocation identified about the left humerus. 2. Suspect chronic resorption of bone from the proximal left radius since 2012. Electronically Signed   By: Genevie Ann M.D.   On: 01/14/2022 11:26   DG Chest 2 View  Result Date: 01/14/2022 IMPRESSION: 1. No acute cardiopulmonary abnormality. 2. Cardiomegaly and Aortic Atherosclerosis (ICD10-I70.0). 3. Osteopenia and spinal kyphoscoliosis. Electronically Signed   By: Genevie Ann M.D.   On: 01/14/2022 11:25   CT HEAD WO CONTRAST  Result Date: 01/14/2022 CLINICAL DATA:  Un witnessed fall this morning.IMPRESSION: HEAD CT 1. No acute intracranial abnormalities. CERVICAL CT 1. No fracture or acute finding. Electronically Signed   By: Lajean Manes M.D.   On: 01/14/2022 11:10   CT CERVICAL SPINE WO CONTRAST  Result Date: 01/14/2022 CLINICAL DATA:  Un witnessed fall this morning. IMPRESSION: HEAD CT 1. No acute intracranial abnormalities. CERVICAL CT 1. No fracture or acute finding. Electronically Signed   By: Lajean Manes M.D.   On: 01/14/2022 11:10       Adellyn Capek M.D. [facility] 01/16/2022, 12:05 PM  Available via Epic secure chat 7am-7pm After 7 pm, please refer to night coverage  provider listed on amion.

## 2022-01-16 NOTE — Progress Notes (Signed)
Physical Therapy Treatment Patient Details Name: Bethany Blake MRN: [id number] DOB: May 25, 1921 Today's Date: 01/16/2022   History of Present Illness pt is a 88 y/o female admitted 10/7 for worsening cough and mentation changes.  Daughter reports recent loose bowels/diarrhea plus recent visits from family members found to have COVID, and episodic confusion with general weakness. +COVID  PMHx, CKD, HTN, arthritis    PT Comments    Patient wanting to get up and use her rollator today. Able to get to EOB and then standing with moderate assistance (has posterior bias in sitting and standing). Able to ambulate with min assist with rollator and +1 for IV pole. Due to imbalance in sitting and standing, feel ST-SNF is still the best option for discharge planning.     Recommendations for follow up therapy are one component of a multi-disciplinary discharge planning process, led by the attending physician.  Recommendations may be updated based on patient status, additional functional criteria and insurance authorization.  Follow Up Recommendations  Skilled nursing-short term rehab (<3 hours/day) (pt may refuse and go home, may progress well and be appropriate for home with HHPT) Can patient physically be transported by private vehicle: No   Assistance Recommended at Discharge Intermittent Supervision/Assistance  Patient can return home with the following A little help with walking and/or transfers;A little help with bathing/dressing/bathroom;Assistance with cooking/housework;Assist for transportation;Help with stairs or ramp for entrance;Direct supervision/assist for medications management;Direct supervision/assist for financial management   Equipment Recommendations  None recommended by PT    Recommendations for Other Services       Precautions / Restrictions Precautions Precautions: Fall Restrictions Weight Bearing Restrictions: No     Mobility  Bed Mobility Overal bed mobility: Needs  Assistance Bed Mobility: Supine to Sit     Supine to sit: Mod assist     General bed mobility comments: cues for direction, truncal assist for transition to sitting , padding and truncal assist for scooting assist, pt with tendency to list moderately posteriorly.    Transfers Overall transfer level: Needs assistance Equipment used: Rollator (4 wheels) Transfers: Sit to/from Stand Sit to Stand: Mod assist           General transfer comment: pt with posterior bias that requires incr assist to overcome    Ambulation/Gait Ambulation/Gait assistance: Min assist, +2 safety/equipment Gait Distance (Feet): 40 Feet Assistive device: Rollator (4 wheels) Gait Pattern/deviations: Step-through pattern, Decreased stride length, Trunk flexed       General Gait Details: pt able to maneuver rollator independently in small space around obstacles; min assist for balance during initial steps due to posterior bias   Stairs             Wheelchair Mobility    Modified Rankin (Stroke Patients Only)       Balance Overall balance assessment: Needs assistance Sitting-balance support: Single extremity supported, Bilateral upper extremity supported, Feet supported Sitting balance-Leahy Scale: Poor Sitting balance - Comments: posterior bias, needing external support today, where usually not needed.   Standing balance support: Bilateral upper extremity supported, Reliant on assistive device for balance Standing balance-Leahy Scale: Poor                              Cognition Arousal/Alertness: Awake/alert Behavior During Therapy: Flat affect Overall Cognitive Status: Difficult to assess  General Comments: follows commands when she hears them; requires repetition at times        Exercises      General Comments        Pertinent Vitals/Pain Pain Assessment Pain Assessment: Faces Faces Pain Scale: No hurt    Home  Living                          Prior Function            PT Goals (current goals can now be found in the care plan section) Acute Rehab PT Goals Patient Stated Goal: wanted to use her rollator and walk Time For Goal Achievement: 01/29/22 Potential to Achieve Goals: Good Progress towards PT goals: Progressing toward goals    Frequency    Min 3X/week      PT Plan Current plan remains appropriate    Co-evaluation PT/OT/SLP Co-Evaluation/Treatment: Yes Reason for Co-Treatment: Other (comment);For patient/therapist safety PT goals addressed during session: Mobility/safety with mobility;Proper use of DME        AM-PAC PT "6 Clicks" Mobility   Outcome Measure  Help needed turning from your back to your side while in a flat bed without using bedrails?: A Lot Help needed moving from lying on your back to sitting on the side of a flat bed without using bedrails?: A Lot Help needed moving to and from a bed to a chair (including a wheelchair)?: A Lot Help needed standing up from a chair using your arms (e.g., wheelchair or bedside chair)?: A Lot Help needed to walk in hospital room?: Total Help needed climbing 3-5 steps with a railing? : Total 6 Click Score: 10    End of Session Equipment Utilized During Treatment: Gait belt Activity Tolerance: Patient tolerated treatment well Patient left: with call bell/phone within reach;in chair;with chair alarm set Nurse Communication: Mobility status PT Visit Diagnosis: Muscle weakness (generalized) (M62.81)     Time: 9024-0973 PT Time Calculation (min) (ACUTE ONLY): 24 min  Charges:  $Gait Training: 8-22 mins                      Jerolyn Center, PT Acute Rehabilitation Services  Office [phone]    Zena Amos 01/16/2022, 4:06 PM

## 2022-01-17 ENCOUNTER — Inpatient Hospital Stay (HOSPITAL_COMMUNITY): Payer: Medicare Other

## 2022-01-17 DIAGNOSIS — G9340 Encephalopathy, unspecified: Secondary | ICD-10-CM | POA: Diagnosis not present

## 2022-01-17 DIAGNOSIS — I10 Essential (primary) hypertension: Secondary | ICD-10-CM | POA: Diagnosis not present

## 2022-01-17 DIAGNOSIS — U071 COVID-19: Secondary | ICD-10-CM | POA: Diagnosis not present

## 2022-01-17 LAB — CBC WITH DIFFERENTIAL/PLATELET
Abs Immature Granulocytes: 0.03 10*3/uL (ref 0.00–0.07)
Basophils Absolute: 0 10*3/uL (ref 0.0–0.1)
Basophils Relative: 0 %
Eosinophils Absolute: 0 10*3/uL (ref 0.0–0.5)
Eosinophils Relative: 0 %
HCT: 26.6 % — ABNORMAL LOW (ref 36.0–46.0)
Hemoglobin: 8.8 g/dL — ABNORMAL LOW (ref 12.0–15.0)
Immature Granulocytes: 1 %
Lymphocytes Relative: 15 %
Lymphs Abs: 0.8 10*3/uL (ref 0.7–4.0)
MCH: 26.3 pg (ref 26.0–34.0)
MCHC: 33.1 g/dL (ref 30.0–36.0)
MCV: 79.4 fL — ABNORMAL LOW (ref 80.0–100.0)
Monocytes Absolute: 0.3 10*3/uL (ref 0.1–1.0)
Monocytes Relative: 5 %
Neutro Abs: 4.5 10*3/uL (ref 1.7–7.7)
Neutrophils Relative %: 79 %
Platelets: 394 10*3/uL (ref 150–400)
RBC: 3.35 MIL/uL — ABNORMAL LOW (ref 3.87–5.11)
RDW: 17.1 % — ABNORMAL HIGH (ref 11.5–15.5)
WBC: 5.6 10*3/uL (ref 4.0–10.5)
nRBC: 0 % (ref 0.0–0.2)

## 2022-01-17 LAB — URINE CULTURE: Culture: NO GROWTH

## 2022-01-17 LAB — COMPREHENSIVE METABOLIC PANEL
ALT: 11 U/L (ref 0–44)
AST: 14 U/L — ABNORMAL LOW (ref 15–41)
Albumin: 2.2 g/dL — ABNORMAL LOW (ref 3.5–5.0)
Alkaline Phosphatase: 34 U/L — ABNORMAL LOW (ref 38–126)
Anion gap: 9 (ref 5–15)
BUN: 20 mg/dL (ref 8–23)
CO2: 21 mmol/L — ABNORMAL LOW (ref 22–32)
Calcium: 8.4 mg/dL — ABNORMAL LOW (ref 8.9–10.3)
Chloride: 105 mmol/L (ref 98–111)
Creatinine, Ser: 0.7 mg/dL (ref 0.44–1.00)
GFR, Estimated: >60 mL/min (ref >60)
Glucose, Bld: 129 mg/dL — ABNORMAL HIGH (ref 70–99)
Potassium: 4.5 mmol/L (ref 3.5–5.1)
Sodium: 135 mmol/L (ref 135–145)
Total Bilirubin: 0.5 mg/dL (ref 0.3–1.2)
Total Protein: 4.7 g/dL — ABNORMAL LOW (ref 6.5–8.1)

## 2022-01-17 LAB — D-DIMER, QUANTITATIVE: D-Dimer, Quant: 5.66 ug/mL-FEU — ABNORMAL HIGH (ref 0.00–0.50)

## 2022-01-17 LAB — PHOSPHORUS: Phosphorus: 2.5 mg/dL (ref 2.5–4.6)

## 2022-01-17 LAB — FERRITIN: Ferritin: 15 ng/mL (ref 11–307)

## 2022-01-17 LAB — MAGNESIUM: Magnesium: 2.2 mg/dL (ref 1.7–2.4)

## 2022-01-17 LAB — C-REACTIVE PROTEIN: CRP: 3.1 mg/dL — ABNORMAL HIGH (ref <1.0)

## 2022-01-17 NOTE — Progress Notes (Signed)
[name], is a 88 y.o. female, DOB - 1921-06-01, [place] date - 01/14/2022    Outpatient Primary MD for the patient is Bethany Baton, MD  LOS - 3  days  Chief Complaint  Patient presents with   Fall       Brief summary   Patient is a 87 year old female with HTN, CKD stage II, chronic hyponatremia, hypothyroidism presented with worsening cough and mentation changes.  Patient is from [place], independent living facility and had an unwitnessed fall on the morning of admission, fell on her left side.    Per daughter, a week ago, she had 2 days of diarrhea, no abdominal pain, no fevers.  Over the weekend 2 family members visited the patient and both done positive for COVID on Monday 5 days prior to admission.  Patient without poor intake of food and fluid and developed episodic confusion, generalized weakness and had been sleeping more.  Subsequently started having cough, thick white to sputum, no fevers.  On the morning of admission, she was more confused than before.  COVID-19 test positive.  Trauma scan imaging is negative   Assessment & Plan    Principal Problem: Symptomatic COVID-19 pneumonitis -Presented with symptomatic COVID, generalized weakness, confusion, dehydration, aspirating -SLP evaluation 10/8 recommended dysphagia 1 diet with thin liquids -Placed on IV Decadron x 10days, however not able to give Paxlovid or molnupiravir p.o. as daughter declined NGT  -Started on IV remdesivir, CRP improving (9.4-> 5.7-> 3.1)  Active Problems:   Fall at home, initial encounter -No fractures or dislocation on the imagings.  -PT recommended SNF  Aspiration pneumonia -Chest x-ray showed right-sided infrahilar opacities worrisome for pneumonia/aspiration -SLP evaluation recommended dysphagia 1 pure diet with thin liquids  -continue IV Unasyn  Severe  dehydration -Likely due to poor oral intake, diarrhea -Placed on IV fluid hydration, now KVO  UTI: UA positive for UTI, follow urine culture and sensitivities -cont IV Unasyn    Hypothyroidism -Continue Synthroid, changed to IV  Essential hypertension -BP now stable, placed on IV hydralazine as needed with parameters  Acute encephalopathy -Likely due to COVID-19, severe dehydration, aspiration pneumonia -Improving, palliative medicine following   Normocytic anemia -Baseline hemoglobin 8-10 -Hb 8.8, stable   Hypomagnesemia -Replaced IV, now resolved    Stage II chronic kidney disease (CKD) (HCC) -Creatinine currently at baseline  Protein calorie malnutrition, hypoalbuminemia Estimated body mass index is 16.16 kg/m as calculated from the following:   Height as of this encounter: 5\' 4"  (1.626 m).   Weight as of this encounter: 42.7 kg.  Code Status: DNR DVT Prophylaxis:  enoxaparin (LOVENOX) injection 30 mg Start: 01/14/22 1615   Level of Care: Level of care: Telemetry Medical Family Communication: updated patient's daughter Ms Bethany Blake on 10/9 Disposition Plan:      Remains inpatient appropriate: COVID-19, aspiration pneumonia   Procedures:  Multiple trauma imagings  Consultants:   Palliative medicine  Antimicrobials:   Anti-infectives (From admission, onward)    Start  Dose/Rate Route Frequency Ordered Stop   01/16/22 1000  remdesivir 100 mg in sodium chloride 0.9 % 100 mL IVPB       See Hyperspace for full Linked Orders Report.   100 mg 200 mL/hr over 30 Minutes Intravenous Daily 01/15/22 1223 01/18/22 0959   01/15/22 1315  remdesivir 200 mg in sodium chloride 0.9% 250 mL IVPB       See Hyperspace for full Linked Orders Report.   200 mg 580 mL/hr over 30 Minutes Intravenous Once 01/15/22 1223 01/15/22 1526   01/15/22 1245  Ampicillin-Sulbactam (UNASYN) 3 g in sodium chloride 0.9 % 100 mL IVPB        3 g 200 mL/hr over 30 Minutes Intravenous Every 12  hours 01/15/22 1147     01/15/22 1230  molnupiravir EUA (LAGEVRIO) capsule 800 mg  Status:  Discontinued        4 capsule Oral 2 times daily 01/15/22 1131 01/15/22 1220   01/14/22 2200  nirmatrelvir/ritonavir EUA (PAXLOVID) 3 tablet  Status:  Discontinued        3 tablet Oral 2 times daily 01/14/22 1602 01/15/22 1131   01/14/22 1615  doxycycline (VIBRAMYCIN) 100 mg in sodium chloride 0.9 % 250 mL IVPB  Status:  Discontinued        100 mg 125 mL/hr over 120 Minutes Intravenous Every 12 hours 01/14/22 1602 01/15/22 1113   01/14/22 1515  cefTRIAXone (ROCEPHIN) 1 g in sodium chloride 0.9 % 100 mL IVPB        1 g 200 mL/hr over 30 Minutes Intravenous STAT 01/14/22 1503 01/14/22 1644   01/14/22 1515  azithromycin (ZITHROMAX) 500 mg in sodium chloride 0.9 % 250 mL IVPB        500 mg 250 mL/hr over 60 Minutes Intravenous STAT 01/14/22 1503 01/14/22 1744          Medications  dexamethasone (DECADRON) injection  6 mg Intravenous Q24H   enoxaparin (LOVENOX) injection  30 mg Subcutaneous Q24H   feeding supplement  237 mL Oral BID BM   [START ON 01/20/2022] levothyroxine  84 mcg Intravenous Daily   multivitamin with minerals  1 tablet Oral Daily      Subjective:   Bethany Blake was seen and examined today.  Wants to go home, no acute issues, nausea vomiting, abdominal pain.  No acute shortness of breath.   Objective:   Vitals:   01/16/22 1800 01/16/22 2120 01/17/22 0449 01/17/22 1033  BP: (!) 167/71 135/79 (!) 162/67 (!) 155/71  Pulse: 72 61 62 79  Resp:  20 19 18   Temp: 98.1 F (36.7 C) 98 F (36.7 C) 97.9 F (36.6 C) (!) 97.3 F (36.3 C)  TempSrc: Oral Oral Oral Oral  SpO2: 94% 97% 97% 97%  Weight:      Height:        Intake/Output Summary (Last 24 hours) at 01/17/2022 1108 Last data filed at 01/17/2022 0415 Gross per 24 hour  Intake 685.85 ml  Output 850 ml  Net -164.15 ml     Wt Readings from Last 3 Encounters:  01/14/22 42.7 kg  06/20/21 58.8 kg  01/25/20 48.8 kg    Physical Exam General: Alert and awake, NAD, comfortable Cardiovascular: S1 S2 clear, RRR.  Respiratory: CTAB Gastrointestinal: Soft, nontender, nondistended, NBS Ext: no pedal edema bilaterally Neuro: no new deficits Skin: No rashes    Data Reviewed:  I have personally reviewed following labs    CBC Lab Results  Component Value Date  WBC 5.6 01/17/2022   RBC 3.35 (L) 01/17/2022   HGB 8.8 (L) 01/17/2022   HCT 26.6 (L) 01/17/2022   MCV 79.4 (L) 01/17/2022   MCH 26.3 01/17/2022   PLT 394 01/17/2022   MCHC 33.1 01/17/2022   RDW 17.1 (H) 01/17/2022   LYMPHSABS 0.8 01/17/2022   MONOABS 0.3 01/17/2022   EOSABS 0.0 01/17/2022   BASOSABS 0.0 01/17/2022     Last metabolic panel Lab Results  Component Value Date   NA 135 01/17/2022   K 4.5 01/17/2022   CL 105 01/17/2022   CO2 21 (L) 01/17/2022   BUN 20 01/17/2022   CREATININE 0.70 01/17/2022   GLUCOSE 129 (H) 01/17/2022   GFRNONAA >60 01/17/2022   GFRAA >60 01/03/2020   CALCIUM 8.4 (L) 01/17/2022   PHOS 2.5 01/17/2022   PROT 4.7 (L) 01/17/2022   ALBUMIN 2.2 (L) 01/17/2022   BILITOT 0.5 01/17/2022   ALKPHOS 34 (L) 01/17/2022   AST 14 (L) 01/17/2022   ALT 11 01/17/2022   ANIONGAP 9 01/17/2022    CBG (last 3)  Recent Labs    01/14/22 1143  GLUCAP 89      Coagulation Profile: No results for input(s): "INR", "PROTIME" in the last 168 hours.   Radiology Studies: I have personally reviewed the imaging studies  DG Chest 1 View  Result Date: 01/15/2022 CLINICAL DATA:  [id number]. Pneumonia follow-up. Question pulmonary edema or pleural effusion.  IMPRESSION: Worsening right infrahilar opacity worrisome for pneumonia or aspiration. Increased small right pleural effusion. Electronically Signed   By: Almira Bar M.D.   On: 01/15/2022 06:18   DG Elbow 2 Views Left  Result Date: 01/14/2022 IMPRESSION: Degenerative changes LEFT elbow joint. 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 Electronically Signed   By: Ulyses Southward M.D.   On: 01/14/2022 12:52   DG Hip Unilat W or Wo Pelvis 2-3 Views Left  Result Date: 01/14/2022  IMPRESSION: No acute fracture or dislocation identified about the left hip or pelvis. If occult hip fracture is suspected or if the patient is unable to weightbear, MRI is the preferred modality for further evaluation. Electronically Signed   By: Odessa Fleming M.D.   On: 01/14/2022 11:27   DG Humerus Left  Result Date: 01/14/2022 IMPRESSION: 1. Osteopenia. No definite acute fracture or dislocation identified about the left humerus. 2. Suspect chronic resorption of bone from the proximal left radius since 2012. Electronically Signed   By: Odessa Fleming M.D.   On: 01/14/2022 11:26   DG Chest 2 View  Result Date: 01/14/2022 IMPRESSION: 1. No acute cardiopulmonary abnormality. 2. Cardiomegaly and Aortic Atherosclerosis (ICD10-I70.0). 3. Osteopenia and spinal kyphoscoliosis. Electronically Signed   By: Odessa Fleming M.D.   On: 01/14/2022 11:25   CT HEAD WO CONTRAST  Result Date: 01/14/2022 CLINICAL DATA:  Un witnessed fall this morning.IMPRESSION: HEAD CT 1. No acute intracranial abnormalities. CERVICAL CT 1. No fracture or acute finding. Electronically Signed   By: Amie Portland M.D.   On: 01/14/2022 11:10   CT CERVICAL SPINE WO CONTRAST  Result Date: 01/14/2022 CLINICAL DATA:  Un witnessed fall this morning. IMPRESSION: HEAD CT 1. No acute intracranial abnormalities. CERVICAL CT 1. No fracture or acute finding. Electronically Signed   By: Amie Portland M.D.   On: 01/14/2022 11:10       Treysen Sudbeck M.D. [facility] 01/17/2022, 11:08 AM  Available via Epic secure chat 7am-7pm After 7 pm, please refer to night  coverage provider listed on amion.

## 2022-01-17 NOTE — NC FL2 (Signed)
[place] MEDICAID FL2 LEVEL OF CARE SCREENING TOOL     IDENTIFICATION  Patient Name: Bethany Blake Birthdate: 1922-01-10 Sex: female Admission Date (Current Location): 01/14/2022  [facility] and [place] Number:  Herbalist and Address:  [facility], [place] [address], [place], [place] [address]      Provider Number: [id number]  Attending Physician Name and Address:  Mendel Corning, MD  Relative Name and Phone Number:  Jon Billings (Daughter)   [phone]    Current Level of Care: Hospital Recommended Level of Care: [place] Prior Approval Number:    Date Approved/Denied:   PASRR Number: [id number]  Discharge Plan: SNF    Current Diagnoses: Patient Active Problem List   Diagnosis Date Noted   Aspiration pneumonia ([place]) 01/15/2022   Pneumonia due to COVID-19 virus 01/14/2022   COVID 01/14/2022   Syncope 06/19/2021   Normocytic anemia 06/19/2021   Stage 3a chronic kidney disease (CKD) ([place]) 06/19/2021   Subdural hematoma ([place]) 06/19/2021   Ulcer of heel and midfoot ([place]) 02/27/2020   E-coli UTI    Acute encephalopathy 01/23/2020   Urinary tract infection without hematuria    Bilateral subdural hematomas ([place]) 12/31/2019   Subarachnoid hemorrhage ([place]) 12/31/2019   Hypothyroidism 12/31/2019   Fall at home, initial encounter 12/31/2019   Aneurysm of right internal carotid artery 12/31/2019   Physical deconditioning 09/02/2014   SIADH (syndrome of inappropriate ADH production) ([place]) 08/28/2014   Essential hypertension 08/28/2014   Confusion 08/28/2014   AKI (acute kidney injury) ([place]) 08/28/2014   Hypochloremia 08/28/2014    Orientation RESPIRATION BLADDER Height & Weight     Self  Normal Incontinent Weight: 94 lb 2.2 oz (42.7 kg) Height:  5\' 4"  (162.6 cm)  BEHAVIORAL SYMPTOMS/MOOD NEUROLOGICAL BOWEL NUTRITION STATUS      Incontinent Diet (see d/c summary)  AMBULATORY STATUS COMMUNICATION OF NEEDS Skin    Extensive Assist Verbally Normal                       Personal Care Assistance Level of Assistance  Bathing, Feeding, Dressing Bathing Assistance: Limited assistance Feeding assistance: Limited assistance Dressing Assistance: Limited assistance     Functional Limitations Info  Sight, Hearing, Speech Sight Info: Adequate Hearing Info: Adequate Speech Info: Adequate    SPECIAL CARE FACTORS FREQUENCY  PT (By licensed PT), OT (By licensed OT)     PT Frequency: 5x/ week OT Frequency: 5x/ week            Contractures Contractures Info: Not present    Additional Factors Info  Code Status, Allergies, Isolation Precautions Code Status Info: DNR Allergies Info: Ace Inhibitors   Aldactone (Spironolactone)   Angiotensin Receptor Blockers   Sulfa Antibiotics     Isolation Precautions Info: Air/con precautions-  COVID+     Current Medications (01/17/2022):  This is the current hospital active medication list Current Facility-Administered Medications  Medication Dose Route Frequency Provider Last Rate Last Admin   acetaminophen (TYLENOL) tablet 500 mg  500 mg Oral Daily PRN Lequita Halt, MD       albuterol (VENTOLIN HFA) 108 (90 Base) MCG/ACT inhaler 2 puff  2 puff Inhalation Q6H PRN Rai, Ripudeep K, MD       Ampicillin-Sulbactam (UNASYN) 3 g in sodium chloride 0.9 % 100 mL IVPB  3 g Intravenous Q12H Rai, Ripudeep K, MD 200 mL/hr at 01/17/22 0907 3 g at 01/17/22 0907   dexamethasone (DECADRON) injection 6  mg  6 mg Intravenous Q24H Wynetta Fines T, MD   6 mg at 01/17/22 1616   enoxaparin (LOVENOX) injection 30 mg  30 mg Subcutaneous Q24H Wynetta Fines T, MD   30 mg at 01/17/22 1615   feeding supplement (ENSURE ENLIVE / ENSURE PLUS) liquid 237 mL  237 mL Oral BID BM Rai, Ripudeep K, MD   237 mL at 01/17/22 0916   guaiFENesin-dextromethorphan (ROBITUSSIN DM) 100-10 MG/5ML syrup 10 mL  10 mL Oral Q4H PRN Wynetta Fines T, MD       hydrALAZINE (APRESOLINE) injection 5 mg  5 mg  Intravenous Q6H PRN Wynetta Fines T, MD   5 mg at 01/17/22 0542   [START ON 01/20/2022] levothyroxine (SYNTHROID, LEVOTHROID) injection 84 mcg  84 mcg Intravenous Daily Rai, Ripudeep K, MD       multivitamin with minerals tablet 1 tablet  1 tablet Oral Daily Rai, Ripudeep K, MD   1 tablet at 01/17/22 0907     Discharge Medications: Please see discharge summary for a list of discharge medications.  Relevant Imaging Results:  Relevant Lab Results:   Additional Information SSN: [id number]; From [place], [place]

## 2022-01-17 NOTE — Progress Notes (Addendum)
Modified Barium Swallow Progress Note  Patient Details  Name: Bethany Blake MRN: [id number] Date of Birth: 1921-12-21  Today's Date: 01/17/2022  Modified Barium Swallow completed.  Full report located under Chart Review in the Imaging Section.  Brief recommendations include the following:  Clinical Impression  Pt presents with oropharyngeal dysphagia characterized by impaired posterior propulsion, a pharyngeal delay and reduction in bolus cohesion, tongue base retraction, and pharyngeal constriction. She demonstrated lingual pumping, premature spillage to the valleculae, vallecular residue and posterior pharyngeal wall residue. Residue was improved with a liquid wash. Penetration (PAS 2; considered WNL) was noted with thin liquids via cup and this progressed to PAS 3 with consecutive swallows of thin liquids when straws were used. A single instance of trace aspiration (PAS 7) was noted after penetration (PAS 3) of thin liquids via straw. This triggered subtle coughing which was ineffective, but aspirated material was expelled with prompts for pt to cough more. Transport of the barium tablet was halted at the level of the valleculae and upper esophagus, but movement was facilitated with additional boluses of thin liquids. A dysphagia 2 diet with thin liquids is recommended at this time with strict observance of swallowing precautions. SLP continue to follow pt.   Swallow Evaluation Recommendations       SLP Diet Recommendations: Dysphagia 2 (Fine chop) solids;Thin liquid   Liquid Administration via: Cup;No straw   Medication Administration: Whole meds with puree   Supervision: Full supervision/cueing for compensatory strategies;Staff to assist with self feeding   Compensations: Minimize environmental distractions;Small sips/bites;Follow solids with liquid   Postural Changes: Seated upright at 90 degrees   Oral Care Recommendations: Oral care BID      [name] I. Hardin Negus, [place],  [place] Office number [place] 01/17/2022,4:02 PM

## 2022-01-17 NOTE — TOC Progression Note (Signed)
Transition of Care [facility]) - Initial/Assessment Note    Patient Details  Name: Bethany Blake MRN: [id number] Date of Birth: [birth year]  [facility] CM/SW Contact:    Milinda Antis, [place] Phone Number: 01/17/2022, 11:38 AM  Clinical Narrative:                 CSW called the patient's daughter to discuss discharge planning.  There was no answer.  CSW left a VM requesting a returned call.        Patient Goals and CMS Choice        Expected Discharge Plan and Services                                                Prior Living Arrangements/Services                       Activities of Daily Living   ADL Screening (condition at time of admission) Patient's cognitive ability adequate to safely complete daily activities?: No Is the patient deaf or have difficulty hearing?: Yes Does the patient have difficulty seeing, even when wearing glasses/contacts?: No Does the patient have difficulty concentrating, remembering, or making decisions?: Yes Patient able to express need for assistance with ADLs?: No Does the patient have difficulty dressing or bathing?: Yes Independently performs ADLs?: No Communication: Independent Dressing (OT): Needs assistance Is this a change from baseline?: Pre-admission baseline Grooming: Needs assistance Is this a change from baseline?: Pre-admission baseline Feeding: Needs assistance Is this a change from baseline?: Pre-admission baseline Bathing: Needs assistance Is this a change from baseline?: Pre-admission baseline Toileting: Needs assistance Is this a change from baseline?: Pre-admission baseline In/Out Bed: Needs assistance Is this a change from baseline?: Pre-admission baseline Walks in Home: Independent with device (comment), Needs assistance Is this a change from baseline?: Pre-admission baseline Does the patient have difficulty walking or climbing stairs?: Yes Weakness of Legs: Both Weakness of  Arms/Hands: Both  Permission Sought/Granted                  Emotional Assessment              Admission diagnosis:  Fall, initial encounter [W19.XXXA] Pneumonia of right lung due to infectious organism, unspecified part of lung [J18.9] COVID [U07.1] COVID-19 [U07.1] Patient Active Problem List   Diagnosis Date Noted   Aspiration pneumonia ([place]) 01/15/2022   Pneumonia due to COVID-19 virus 01/14/2022   COVID 01/14/2022   Syncope 06/19/2021   Normocytic anemia 06/19/2021   Stage 3a chronic kidney disease (CKD) ([place]) 06/19/2021   Subdural hematoma ([place]) 06/19/2021   Ulcer of heel and midfoot ([place]) 02/27/2020   E-coli UTI    Acute encephalopathy 01/23/2020   Urinary tract infection without hematuria    Bilateral subdural hematomas ([place]) 12/31/2019   Subarachnoid hemorrhage ([place]) 12/31/2019   Hypothyroidism 12/31/2019   Fall at home, initial encounter 12/31/2019   Aneurysm of right internal carotid artery 12/31/2019   Physical deconditioning 09/02/2014   SIADH (syndrome of inappropriate ADH production) ([place]) 08/28/2014   Essential hypertension 08/28/2014   Confusion 08/28/2014   AKI (acute kidney injury) ([place]) 08/28/2014   Hypochloremia 08/28/2014   PCP:  [name], MD Pharmacy:   [facility], [place] - [address] [place] [place] [address] Phone: [phone] Fax:  [phone]     Social Determinants of Health (SDOH) Interventions    Readmission Risk Interventions     No data to display

## 2022-01-17 NOTE — Progress Notes (Signed)
Speech Language Pathology Treatment: Dysphagia  Patient Details Name: Bethany Blake MRN: [id number] DOB: [birth year] Today's Date: 01/17/2022 Time: 4097-3532 SLP Time Calculation (min) (ACUTE ONLY): 14 min  Assessment / Plan / Recommendation Clinical Impression  Pt was seen for dysphagia treatment with her daughter present. Pt's daughter reported that the pt has not been enjoying the food. Pt demonstrated prolonged mastication and she removed some solids that were extensively masticated, but oral phase was improved compared to when she was seen on 10/8. Pt inconsistently exhibited coughing with thin liquids via straw, but not with nectar thick liquids. Pt does demonstrate potential for some advancement of solids. Considering pt's inconsistent signs of aspiration and the results of the most recent imaging, a modified barium swallow study is recommended to further assess swallow function and it is scheduled for today at 1500.    HPI HPI: Pt is a 88 y.o. female who was sent from facility for evaluation of worsening of cough and mentation changes. CXR 10/7 negative; 10/8: Worsening right infrahilar opacity worrisome for pneumonia or  aspiration. Dx COVID-19, acute metaboloic encephalopathy. PMH: dementia, HTN, CKD stage II, chronic hyponatremia, hypothyroidism.      SLP Plan  Continue with current plan of care      Recommendations for follow up therapy are one component of a multi-disciplinary discharge planning process, led by the attending physician.  Recommendations may be updated based on patient status, additional functional criteria and insurance authorization.    Recommendations  Diet recommendations:  (Deferred until MBS completed) Liquids provided via: Cup;Straw Medication Administration: Whole meds with puree Supervision: Staff to assist with self feeding Compensations: Minimize environmental distractions;Small sips/bites Postural Changes and/or Swallow Maneuvers: Seated upright 90  degrees                Oral Care Recommendations: Oral care BID Follow Up Recommendations:  (TBD) Assistance recommended at discharge: Frequent or constant Supervision/Assistance SLP Visit Diagnosis: Dysphagia, unspecified (R13.10) Plan: Continue with current plan of care         Contrell Ballentine I. Hardin Negus, [place], [place] Office number [phone]  Bethany Blake  01/17/2022, 2:26 PM

## 2022-01-18 DIAGNOSIS — G9340 Encephalopathy, unspecified: Secondary | ICD-10-CM | POA: Diagnosis not present

## 2022-01-18 DIAGNOSIS — I10 Essential (primary) hypertension: Secondary | ICD-10-CM | POA: Diagnosis not present

## 2022-01-18 DIAGNOSIS — U071 COVID-19: Secondary | ICD-10-CM | POA: Diagnosis not present

## 2022-01-18 LAB — COMPREHENSIVE METABOLIC PANEL
ALT: 14 U/L (ref 0–44)
AST: 19 U/L (ref 15–41)
Albumin: 2.4 g/dL — ABNORMAL LOW (ref 3.5–5.0)
Alkaline Phosphatase: 37 U/L — ABNORMAL LOW (ref 38–126)
Anion gap: 7 (ref 5–15)
BUN: 20 mg/dL (ref 8–23)
CO2: 24 mmol/L (ref 22–32)
Calcium: 8.2 mg/dL — ABNORMAL LOW (ref 8.9–10.3)
Chloride: 106 mmol/L (ref 98–111)
Creatinine, Ser: 0.69 mg/dL (ref 0.44–1.00)
GFR, Estimated: >60 mL/min (ref >60)
Glucose, Bld: 96 mg/dL (ref 70–99)
Potassium: 4.3 mmol/L (ref 3.5–5.1)
Sodium: 137 mmol/L (ref 135–145)
Total Bilirubin: 0.3 mg/dL (ref 0.3–1.2)
Total Protein: 4.8 g/dL — ABNORMAL LOW (ref 6.5–8.1)

## 2022-01-18 LAB — CBC WITH DIFFERENTIAL/PLATELET
Abs Immature Granulocytes: 0.02 10*3/uL (ref 0.00–0.07)
Basophils Absolute: 0 10*3/uL (ref 0.0–0.1)
Basophils Relative: 0 %
Eosinophils Absolute: 0 10*3/uL (ref 0.0–0.5)
Eosinophils Relative: 0 %
HCT: 27.8 % — ABNORMAL LOW (ref 36.0–46.0)
Hemoglobin: 8.9 g/dL — ABNORMAL LOW (ref 12.0–15.0)
Immature Granulocytes: 0 %
Lymphocytes Relative: 23 %
Lymphs Abs: 1.2 10*3/uL (ref 0.7–4.0)
MCH: 25.9 pg — ABNORMAL LOW (ref 26.0–34.0)
MCHC: 32 g/dL (ref 30.0–36.0)
MCV: 80.8 fL (ref 80.0–100.0)
Monocytes Absolute: 0.4 10*3/uL (ref 0.1–1.0)
Monocytes Relative: 7 %
Neutro Abs: 3.6 10*3/uL (ref 1.7–7.7)
Neutrophils Relative %: 70 %
Platelets: 446 10*3/uL — ABNORMAL HIGH (ref 150–400)
RBC: 3.44 MIL/uL — ABNORMAL LOW (ref 3.87–5.11)
RDW: 17.2 % — ABNORMAL HIGH (ref 11.5–15.5)
WBC: 5.3 10*3/uL (ref 4.0–10.5)
nRBC: 0 % (ref 0.0–0.2)

## 2022-01-18 LAB — C-REACTIVE PROTEIN: CRP: 2.2 mg/dL — ABNORMAL HIGH (ref <1.0)

## 2022-01-18 LAB — MAGNESIUM: Magnesium: 1.8 mg/dL (ref 1.7–2.4)

## 2022-01-18 LAB — PHOSPHORUS: Phosphorus: 2.6 mg/dL (ref 2.5–4.6)

## 2022-01-18 LAB — FERRITIN: Ferritin: 12 ng/mL (ref 11–307)

## 2022-01-18 LAB — D-DIMER, QUANTITATIVE: D-Dimer, Quant: 5.37 ug/mL-FEU — ABNORMAL HIGH (ref 0.00–0.50)

## 2022-01-18 MED ORDER — LEVOTHYROXINE SODIUM 112 MCG PO TABS
112.0000 ug | ORAL_TABLET | Freq: Every day | ORAL | Status: DC
  Administered 2022-01-19 – 2022-01-25 (×7): 112 ug via ORAL
  Filled 2022-01-18 (×7): qty 1

## 2022-01-18 MED ORDER — AMOXICILLIN-POT CLAVULANATE 500-125 MG PO TABS
1.0000 | ORAL_TABLET | Freq: Two times a day (BID) | ORAL | Status: DC
  Administered 2022-01-18 – 2022-01-21 (×6): 1 via ORAL
  Filled 2022-01-18 (×7): qty 1

## 2022-01-18 MED ORDER — HYDRALAZINE HCL 50 MG PO TABS
50.0000 mg | ORAL_TABLET | Freq: Three times a day (TID) | ORAL | Status: DC
  Administered 2022-01-18 – 2022-01-21 (×9): 50 mg via ORAL
  Filled 2022-01-18 (×11): qty 1

## 2022-01-18 MED ORDER — DEXAMETHASONE 6 MG PO TABS
6.0000 mg | ORAL_TABLET | Freq: Every day | ORAL | Status: AC
  Administered 2022-01-18 – 2022-01-23 (×6): 6 mg via ORAL
  Filled 2022-01-18 (×6): qty 1

## 2022-01-18 NOTE — TOC Progression Note (Signed)
Transition of Care [facility]) - Initial/Assessment Note    Patient Details  Name: Bethany Blake MRN: [id number] Date of Birth: 07/05/1921  Transition of Care [facility]) CM/SW Contact:    Milinda Antis, [place] Phone Number: 01/18/2022, 11:15 AM  Clinical Narrative:                 CSW contacted the patient's daughter to provide SNF bed offers.  There was no answer.  CSW left a VM requesting a returned call.         Patient Goals and CMS Choice        Expected Discharge Plan and Services                                                Prior Living Arrangements/Services                       Activities of Daily Living   ADL Screening (condition at time of admission) Patient's cognitive ability adequate to safely complete daily activities?: No Is the patient deaf or have difficulty hearing?: Yes Does the patient have difficulty seeing, even when wearing glasses/contacts?: No Does the patient have difficulty concentrating, remembering, or making decisions?: Yes Patient able to express need for assistance with ADLs?: No Does the patient have difficulty dressing or bathing?: Yes Independently performs ADLs?: No Communication: Independent Dressing (OT): Needs assistance Is this a change from baseline?: Pre-admission baseline Grooming: Needs assistance Is this a change from baseline?: Pre-admission baseline Feeding: Needs assistance Is this a change from baseline?: Pre-admission baseline Bathing: Needs assistance Is this a change from baseline?: Pre-admission baseline Toileting: Needs assistance Is this a change from baseline?: Pre-admission baseline In/Out Bed: Needs assistance Is this a change from baseline?: Pre-admission baseline Walks in Home: Independent with device (comment), Needs assistance Is this a change from baseline?: Pre-admission baseline Does the patient have difficulty walking or climbing stairs?: Yes Weakness of Legs: Both Weakness of  Arms/Hands: Both  Permission Sought/Granted                  Emotional Assessment              Admission diagnosis:  Fall, initial encounter [W19.XXXA] Pneumonia of right lung due to infectious organism, unspecified part of lung [J18.9] COVID [U07.1] COVID-19 [U07.1] Patient Active Problem List   Diagnosis Date Noted   Aspiration pneumonia ([place]) 01/15/2022   Pneumonia due to COVID-19 virus 01/14/2022   COVID 01/14/2022   Syncope 06/19/2021   Normocytic anemia 06/19/2021   Stage 3a chronic kidney disease (CKD) ([place]) 06/19/2021   Subdural hematoma ([place]) 06/19/2021   Ulcer of heel and midfoot ([place]) 02/27/2020   E-coli UTI    Acute encephalopathy 01/23/2020   Urinary tract infection without hematuria    Bilateral subdural hematomas ([place]) 12/31/2019   Subarachnoid hemorrhage ([place]) 12/31/2019   Hypothyroidism 12/31/2019   Fall at home, initial encounter 12/31/2019   Aneurysm of right internal carotid artery 12/31/2019   Physical deconditioning 09/02/2014   SIADH (syndrome of inappropriate ADH production) ([place]) 08/28/2014   Essential hypertension 08/28/2014   Confusion 08/28/2014   AKI (acute kidney injury) ([place]) 08/28/2014   Hypochloremia 08/28/2014   PCP:  [name], MD Pharmacy:   [place], [place] - [address] [place] [place] [address] Phone:  [phone] Fax: [phone]     Social Determinants of Health (SDOH) Interventions    Readmission Risk Interventions     No data to display

## 2022-01-18 NOTE — Consult Note (Deleted)
Palliative Medicine Inpatient Follow Up Note HPI: Patient is a 88-year-old female with HTN, CKD stage II, chronic hyponatremia, hypothyroidism presented with worsening cough and mentation changes. Patient is from [place], independent living facility and had an unwitnessed fall on the morning of admission, fell on her left side.  Had diarrhea one week ago and have been in contacts with family who have COVID. Patient with covid 19 pneumonia. Palliative care requested for further GOC conversations.    Today's Discussion 01/18/2022   *Please note that this is a verbal dictation therefore any spelling or grammatical errors are due to the "Dragon Medical One" system interpretation.   Chart reviewed inclusive of vital signs, progress notes, laboratory results, and diagnostic images.    I spoke to patient's RN, Samer early this morning.  He shares with me that the patient had some agitation this morning and had refused lab draws.   I met with Johannah at bedside this morning while phlebotomy was present making a second attempt to draw labs.  Clear communication was held with patient, she is quite hard of hearing and she did allow the technician to draw her labs.  Patient is certainly more disoriented this morning asking where she is and why she is here.  She continues to perseverate on the desire to go home. ____________________________________________________________________________________________________   I spoke to patient's daughter, Laurie this morning.  She shares that she is a bit confused by the plan for discharge and she is not sure if her mother will or will not go to a skilled nursing facility. Created space and opportunity for patients daughter to explore thoughts feelings and fears regarding current medical situation.  Laurie expresses that she worries if Elvy goes to a rehab she may not fare as well, she had been through this before with her father and his outcomes after rehabilitation  were exceptionally poor.   We discussed the idea of mobilizing Ronica to see how well she could do on her own.    The physical therapy team had tried to get Tiari up this morning though her daughter was not present and they were unable to do so.  I reached out to them and they shared they were unable to circle back today.   I went to bedside and helped patient's daughter get Sylvester out of bed to the chair.  She was quite rigid and weak.  This was helpful for Laurie to see as she understands that the patient does need a short-term stent at skilled nursing.   A discussion regarding completion of a MOST form was held.  Patient's daughter endorses that she had taken this form home though does plan on completing it in the near future.   Discussed the idea of outpatient palliative support checking in once or twice a month which patient's daughter is in agreement with.  She shares that there are too many people checking into often it tends to cause [place] frustration.    Questions and concerns addressed/Palliative Support Provided.    Objective Assessment: Vital Signs     Vitals:    01/17/22 2100 01/18/22 0533  BP: (!) 151/72 (!) 179/59  Pulse: 67 63  Resp: 20 19  Temp: 98 F (36.7 C) 97.9 F (36.6 C)  SpO2: 98% 96%      Intake/Output Summary (Last 24 hours) at 01/18/2022 0708 Last data filed at 01/18/2022 0533    Gross per 24 hour  Intake 240 ml  Output 800 ml  Net -560 ml      Last Weight  Most recent update: 01/14/2022  6:26 PM      Weight  42.7 kg (94 lb 2.2 oz)                   Gen: Frail elderly Caucasian female in no acute distress HEENT: moist mucous membranes CV: Regular rate and rhythm PULM: On room air breathing is even and nonlabored ABD: soft/nontender EXT: No edema Neuro: Alert and oriented x3 -quite hard of hearing   SUMMARY OF RECOMMENDATIONS   DNAR/DNI   Goal DNR at bedside    Goals remain for improvement   Plan for short-term skilled nursing facility  placement   Patient's daughter does agree to outpatient palliative support   Ongoing incremental palliative care support   Time Spent: 72   Billing based on MDM: High   Problems Addressed: One acute or chronic illness or injury that poses a threat to life or bodily function   Amount and/or Complexity of Data: Category 3:Discussion of management or test interpretation with external physician/other qualified health care professional/appropriate source (not separately reported)   Risks: Decision regarding hospitalization or escalation of hospital care and Decision not to resuscitate or to de-escalate care because of poor prognosis ______________________________________________________________________________________ Irene Mitcham [place] Palliative Medicine Team Team Cell Phone: [phone] Please utilize secure chat with additional questions, if there is no response within 30 minutes please call the above phone number   Palliative Medicine Team providers are available by phone from 7am to 7pm daily and can be reached through the team cell phone.  Should this patient require assistance outside of these hours, please call the patient's attending physician. 

## 2022-01-18 NOTE — TOC Progression Note (Signed)
Transition of Care [facility]) - Initial/Assessment Note    Patient Details  Name: Bethany Blake MRN: [id number] Date of Birth: [birth year]  [facility]) CM/SW Contact:    Milinda Antis, [place] Phone Number: 01/18/2022, 3:57 PM  Clinical Narrative:                 CSW spoke with the patient's daughter and presented bed offers.  The family choose [facility].  CSW contacted [facility] SNF to accept bed offer and inquire about COVID policy.  The facility can accept the patient once she has been quarantined in the hospital for 10 days.  CSW will submit for insurance authorization when closer to the 10 days.    TOC will continue to follow.          Patient Goals and CMS Choice        Expected Discharge Plan and Services                                                Prior Living Arrangements/Services                       Activities of Daily Living   ADL Screening (condition at time of admission) Patient's cognitive ability adequate to safely complete daily activities?: No Is the patient deaf or have difficulty hearing?: Yes Does the patient have difficulty seeing, even when wearing glasses/contacts?: No Does the patient have difficulty concentrating, remembering, or making decisions?: Yes Patient able to express need for assistance with ADLs?: No Does the patient have difficulty dressing or bathing?: Yes Independently performs ADLs?: No Communication: Independent Dressing (OT): Needs assistance Is this a change from baseline?: Pre-admission baseline Grooming: Needs assistance Is this a change from baseline?: Pre-admission baseline Feeding: Needs assistance Is this a change from baseline?: Pre-admission baseline Bathing: Needs assistance Is this a change from baseline?: Pre-admission baseline Toileting: Needs assistance Is this a change from baseline?: Pre-admission baseline In/Out Bed: Needs assistance Is this a change from baseline?:  Pre-admission baseline Walks in Home: Independent with device (comment), Needs assistance Is this a change from baseline?: Pre-admission baseline Does the patient have difficulty walking or climbing stairs?: Yes Weakness of Legs: Both Weakness of Arms/Hands: Both  Permission Sought/Granted                  Emotional Assessment              Admission diagnosis:  Fall, initial encounter [W19.XXXA] Pneumonia of right lung due to infectious organism, unspecified part of lung [J18.9] COVID [U07.1] COVID-19 [U07.1] Patient Active Problem List   Diagnosis Date Noted   Aspiration pneumonia ([place]) 01/15/2022   Pneumonia due to COVID-19 virus 01/14/2022   COVID 01/14/2022   Syncope 06/19/2021   Normocytic anemia 06/19/2021   Stage 3a chronic kidney disease (CKD) ([place]) 06/19/2021   Subdural hematoma ([place]) 06/19/2021   Ulcer of heel and midfoot ([place]) 02/27/2020   E-coli UTI    Acute encephalopathy 01/23/2020   Urinary tract infection without hematuria    Bilateral subdural hematomas ([place]) 12/31/2019   Subarachnoid hemorrhage ([place]) 12/31/2019   Hypothyroidism 12/31/2019   Fall at home, initial encounter 12/31/2019   Aneurysm of right internal carotid artery 12/31/2019   Physical deconditioning 09/02/2014   SIADH (syndrome of inappropriate ADH production) ([place]) 08/28/2014   Essential  hypertension 08/28/2014   Confusion 08/28/2014   AKI (acute kidney injury) ([place]) 08/28/2014   Hypochloremia 08/28/2014   PCP:  [name], MD Pharmacy:   [facility] [address] [place] - [id number] [place] [place] [address] Phone: [phone] Fax: [phone]     Social Determinants of Health (SDOH) Interventions    Readmission Risk Interventions     No data to display

## 2022-01-18 NOTE — Progress Notes (Signed)
[facility] [place], is a 88 y.o. female, DOB - [birth year], [place] date - 01/14/2022    Outpatient Primary MD for the patient is [name], MD  LOS - 4  days  Chief Complaint  Patient presents with   Fall       Brief summary   Patient is a 88 year old female with HTN, CKD stage II, chronic hyponatremia, hypothyroidism presented with worsening cough and mentation changes.  Patient is from [place], independent living facility and had an unwitnessed fall on the morning of admission, fell on her left side.    Per daughter, a week ago, she had 2 days of diarrhea, no abdominal pain, no fevers.  Over the weekend 2 family members visited the patient and both done positive for COVID on Monday 5 days prior to admission.  Patient without poor intake of food and fluid and developed episodic confusion, generalized weakness and had been sleeping more.  Subsequently started having cough, thick white to sputum, no fevers.  On the morning of admission, she was more confused than before.  COVID-19 test positive.  Trauma scan imaging is negative   Assessment & Plan    Principal Problem: Symptomatic COVID-19 pneumonitis -Presented with symptomatic COVID, generalized weakness, confusion, dehydration, aspirating -SLP evaluation 10/8 recommended dysphagia 1 diet with thin liquids -Was started on IV Decadron, will transition to oral for 6 more days to complete course x 10 days.   -not able to give Paxlovid or molnupiravir p.o. as daughter declined NGT (both oral alternatives cannot be crushed) -Started on IV remdesivir, CRP improving (9.4-> 5.7-> 3.1-> 2.2)  Active Problems:   Fall at home, initial encounter -No fractures or dislocation on the imagings.  -PT recommended SNF  Aspiration pneumonia -Chest x-ray showed right-sided infrahilar opacities worrisome for  pneumonia/aspiration -SLP evaluation recommended dysphagia 1 pure diet with thin liquids  -continue IV Unasyn, transition to oral Augmentin on DC  Severe dehydration -Likely due to poor oral intake, diarrhea -Placed on IV fluid hydration, now KVO  Presumed UTI: -Urine culture showed no growth    Hypothyroidism -Continue Synthroid, changed to IV  Essential hypertension -BP elevated, resume hydralazine 50 mg 3 times daily  Acute encephalopathy -Likely due to COVID-19, severe dehydration, aspiration pneumonia -Improving, palliative medicine following   Normocytic anemia -Baseline hemoglobin 8-10 -Stable 8.9  Hypomagnesemia -Replaced IV, resolved    Stage II chronic kidney disease (CKD) (HCC) -Creatinine currently at baseline  Protein calorie malnutrition, hypoalbuminemia Estimated body mass index is 16.16 kg/m as calculated from the following:   Height as of this encounter: 5\' 4"  (1.626 m).   Weight as of this encounter: 42.7 kg.  Code Status: DNR DVT Prophylaxis:  enoxaparin (LOVENOX) injection 30 mg Start: 01/14/22 1615   Level of Care: Level of care: Telemetry Medical Family Communication: updated patient's daughter Ms Lynnette Caffey on 10/9 Disposition Plan:      Remains inpatient appropriate: Possible DC to SNF in a.m.  Procedures:  Multiple trauma imagings  Consultants:  Palliative medicine  Antimicrobials:   Anti-infectives (From admission, onward)    Start     Dose/Rate Route Frequency Ordered Stop   01/16/22 1000  remdesivir 100 mg in sodium chloride 0.9 % 100 mL IVPB       See Hyperspace for full Linked Orders Report.   100 mg 200 mL/hr over 30 Minutes Intravenous Daily 01/15/22 1223 01/17/22 1108   01/15/22 1315  remdesivir 200 mg in sodium chloride 0.9% 250 mL IVPB       See Hyperspace for full Linked Orders Report.   200 mg 580 mL/hr over 30 Minutes Intravenous Once 01/15/22 1223 01/15/22 1526   01/15/22 1245  Ampicillin-Sulbactam (UNASYN) 3 g in  sodium chloride 0.9 % 100 mL IVPB        3 g 200 mL/hr over 30 Minutes Intravenous Every 12 hours 01/15/22 1147     01/15/22 1230  molnupiravir EUA (LAGEVRIO) capsule 800 mg  Status:  Discontinued        4 capsule Oral 2 times daily 01/15/22 1131 01/15/22 1220   01/14/22 2200  nirmatrelvir/ritonavir EUA (PAXLOVID) 3 tablet  Status:  Discontinued        3 tablet Oral 2 times daily 01/14/22 1602 01/15/22 1131   01/14/22 1615  doxycycline (VIBRAMYCIN) 100 mg in sodium chloride 0.9 % 250 mL IVPB  Status:  Discontinued        100 mg 125 mL/hr over 120 Minutes Intravenous Every 12 hours 01/14/22 1602 01/15/22 1113   01/14/22 1515  cefTRIAXone (ROCEPHIN) 1 g in sodium chloride 0.9 % 100 mL IVPB        1 g 200 mL/hr over 30 Minutes Intravenous STAT 01/14/22 1503 01/14/22 1644   01/14/22 1515  azithromycin (ZITHROMAX) 500 mg in sodium chloride 0.9 % 250 mL IVPB        500 mg 250 mL/hr over 60 Minutes Intravenous STAT 01/14/22 1503 01/14/22 1744          Medications  dexamethasone (DECADRON) injection  6 mg Intravenous Q24H   enoxaparin (LOVENOX) injection  30 mg Subcutaneous Q24H   feeding supplement  237 mL Oral BID BM   [START ON 01/20/2022] levothyroxine  84 mcg Intravenous Daily   multivitamin with minerals  1 tablet Oral Daily      Subjective:   Alexsys Popp was seen and examined today.  Wondering when can she go home.  No fevers or chills, no cough, no chest pain.   Objective:   Vitals:   01/17/22 1033 01/17/22 1659 01/17/22 2100 01/18/22 0533  BP: (!) 155/71 (!) 172/59 (!) 151/72 (!) 179/59  Pulse: 79 71 67 63  Resp: 18 17 20 19   Temp: (!) 97.3 F (36.3 C) 98.1 F (36.7 C) 98 F (36.7 C) 97.9 F (36.6 C)  TempSrc: Oral Oral Oral Oral  SpO2: 97% 97% 98% 96%  Weight:      Height:        Intake/Output Summary (Last 24 hours) at 01/18/2022 1244 Last data filed at 01/18/2022 0533 Gross per 24 hour  Intake 240 ml  Output 800 ml  Net -560 ml     Wt Readings from  Last 3 Encounters:  01/14/22 42.7 kg  06/20/21 58.8 kg  01/25/20 48.8 kg   Physical Exam General: Alert and oriented x 3, NAD Cardiovascular: S1 S2 clear, RRR.  Respiratory: CTAB, no wheezing Gastrointestinal: Soft, nontender, nondistended, NBS Ext: no pedal edema bilaterally Psych: Normal affect     Data Reviewed:  I have personally reviewed following labs    CBC Lab Results  Component Value Date   WBC 5.3 01/18/2022   RBC 3.44 (L) 01/18/2022   HGB 8.9 (L) 01/18/2022   HCT 27.8 (L) 01/18/2022   MCV 80.8 01/18/2022   MCH 25.9 (L) 01/18/2022   PLT 446 (H) 01/18/2022   MCHC 32.0 01/18/2022   RDW 17.2 (H) 01/18/2022   LYMPHSABS 1.2 01/18/2022   MONOABS 0.4 01/18/2022   EOSABS 0.0 01/18/2022   BASOSABS 0.0 [id number]     Last metabolic panel Lab Results  Component Value Date   NA 137 01/18/2022   K 4.3 01/18/2022   CL 106 01/18/2022   CO2 24 01/18/2022   BUN 20 01/18/2022   CREATININE 0.69 01/18/2022   GLUCOSE 96 01/18/2022   GFRNONAA >60 01/18/2022   GFRAA >60 01/03/2020   CALCIUM 8.2 (L) 01/18/2022   PHOS 2.6 01/18/2022   PROT 4.8 (L) 01/18/2022   ALBUMIN 2.4 (L) 01/18/2022   BILITOT 0.3 01/18/2022   ALKPHOS 37 (L) 01/18/2022   AST 19 01/18/2022   ALT 14 01/18/2022   ANIONGAP 7 01/18/2022    CBG (last 3)  No results for input(s): "GLUCAP" in the last 72 hours.     Coagulation Profile: No results for input(s): "INR", "PROTIME" in the last 168 hours.   Radiology Studies: I have personally reviewed the imaging studies  DG Chest 1 View  Result Date: 01/15/2022 CLINICAL DATA:  [id number]. Pneumonia follow-up. Question pulmonary edema or pleural effusion.  IMPRESSION: Worsening right infrahilar opacity worrisome for pneumonia or aspiration. Increased small right pleural effusion. Electronically Signed   By: Telford Nab M.D.   On: 01/15/2022 06:18   DG Elbow 2 Views Left  Result Date: 01/14/2022 IMPRESSION: Degenerative changes LEFT elbow joint.  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 Electronically Signed   By: Lavonia Dana M.D.   On: 01/14/2022 12:52   DG Hip Unilat W or Wo Pelvis 2-3 Views Left  Result Date: 01/14/2022  IMPRESSION: No acute fracture or dislocation identified about the left hip or pelvis. If occult hip fracture is suspected or if the patient is unable to weightbear, MRI is the preferred modality for further evaluation. Electronically Signed   By: Genevie Ann M.D.   On: 01/14/2022 11:27   DG Humerus Left  Result Date: 01/14/2022 IMPRESSION: 1. Osteopenia. No definite acute fracture or dislocation identified about the left humerus. 2. Suspect chronic resorption of bone from the proximal left radius since 2012. Electronically Signed   By: Genevie Ann M.D.   On: 01/14/2022 11:26   DG Chest 2 View  Result Date: 01/14/2022 IMPRESSION: 1. No acute cardiopulmonary abnormality. 2. Cardiomegaly and Aortic Atherosclerosis (ICD10-I70.0). 3. Osteopenia and spinal kyphoscoliosis. Electronically Signed   By: Genevie Ann M.D.   On: 01/14/2022 11:25   CT HEAD WO CONTRAST  Result Date: 01/14/2022 CLINICAL DATA:  Un witnessed fall this morning.IMPRESSION: HEAD CT 1. No acute intracranial abnormalities. CERVICAL CT 1. No fracture or acute finding. Electronically Signed   By: Lajean Manes M.D.   On: 01/14/2022 11:10   CT CERVICAL SPINE WO CONTRAST  Result Date: 01/14/2022 CLINICAL DATA:  Un witnessed fall this morning. IMPRESSION: HEAD CT 1. No acute intracranial abnormalities. CERVICAL CT 1. No fracture or acute finding. Electronically Signed   By: Lajean Manes M.D.   On: 01/14/2022 11:10       Atavia Poppe M.D. [facility] 01/18/2022, 12:44  PM  Available via Epic secure chat 7am-7pm After 7 pm, please refer to night coverage provider listed on amion.

## 2022-01-18 NOTE — Progress Notes (Addendum)
Palliative Medicine Inpatient Follow Up Note HPI: Patient is a 88 year old female with HTN, CKD stage II, chronic hyponatremia, hypothyroidism presented with worsening cough and mentation changes. Patient is from [place], independent living facility and had an unwitnessed fall on the morning of admission, fell on her left side.  Had diarrhea one week ago and have been in contacts with family who have [place]. Patient with covid 19 pneumonia. Palliative care requested for further [place] conversations.    Today's Discussion 01/18/2022   *Please note that this is a verbal dictation therefore any spelling or grammatical errors are due to the "[employer]" system interpretation.   Chart reviewed inclusive of vital signs, progress notes, laboratory results, and diagnostic images.    I spoke to patient's RN, Samer early this morning.  He shares with me that the patient had some agitation this morning and had refused lab draws.   I met with Kristien at bedside this morning while phlebotomy was present making a second attempt to draw labs.  Clear communication was held with patient, she is quite hard of hearing and she did allow the technician to draw her labs.  Patient is certainly more disoriented this morning asking where she is and why she is here.  She continues to perseverate on the desire to go home. ____________________________________________________________________________________________________   I spoke to patient's daughter, Bethany Blake this morning.  She shares that she is a bit confused by the plan for discharge and she is not sure if her mother will or will not go to a skilled nursing facility. Created space and opportunity for patients daughter to explore thoughts feelings and fears regarding current medical situation.  Bethany Blake expresses that she worries if Bethany Blake goes to a rehab she may not fare as well, she had been through this before with her father and his outcomes after rehabilitation  were exceptionally poor.   We discussed the idea of mobilizing [name] to see how well she could do on her own.    The physical therapy team had tried to get [name] up this morning though her daughter was not present and they were unable to do so.  I reached out to them and they shared they were unable to circle back today.   I went to bedside and helped patient's daughter get [name] out of bed to the chair.  She was quite rigid and weak.  This was helpful for Bethany Blake to see as she understands that the patient does need a short-term stent at skilled nursing.   A discussion regarding completion of a MOST form was held.  Patient's daughter endorses that she had taken this form home though does plan on completing it in the near future.   Discussed the idea of outpatient palliative support checking in once or twice a month which patient's daughter is in agreement with.  She shares that there are too many people checking into often it tends to cause [name] frustration.    Questions and concerns addressed/Palliative Support Provided.    Objective Assessment: Vital Signs     Vitals:    01/17/22 2100 01/18/22 0533  BP: (!) 151/72 (!) 179/59  Pulse: 67 63  Resp: 20 19  Temp: 98 F (36.7 C) 97.9 F (36.6 C)  SpO2: 98% 96%      Intake/Output Summary (Last 24 hours) at 01/18/2022 0708 Last data filed at 01/18/2022 0533    Gross per 24 hour  Intake 240 ml  Output 800 ml  Net -560 ml  Last Weight  Most recent update: 01/14/2022  6:26 PM      Weight  42.7 kg (94 lb 2.2 oz)                   Gen: Frail elderly Caucasian female in no acute distress HEENT: moist mucous membranes CV: Regular rate and rhythm PULM: On room air breathing is even and nonlabored ABD: soft/nontender EXT: No edema Neuro: Alert and oriented x3 -quite hard of hearing   SUMMARY OF RECOMMENDATIONS   DNAR/DNI   Goal DNR at bedside    Goals remain for improvement   Plan for short-term skilled nursing facility  placement   Patient's daughter does agree to outpatient palliative support   Ongoing incremental palliative care support   Time Spent: 95   Billing based on MDM: High   Problems Addressed: One acute or chronic illness or injury that poses a threat to life or bodily function   Amount and/or Complexity of Data: Category 3:Discussion of management or test interpretation with external physician/other qualified health care professional/appropriate source (not separately reported)   Risks: Decision regarding hospitalization or escalation of hospital care and Decision not to resuscitate or to de-escalate care because of poor prognosis ______________________________________________________________________________________ [facility] Team Team Cell Phone: [phone] Please utilize secure chat with additional questions, if there is no response within 30 minutes please call the above phone number   Palliative Medicine Team providers are available by phone from 7am to 7pm daily and can be reached through the team cell phone.  Should this patient require assistance outside of these hours, please call the patient's attending physician.

## 2022-01-18 NOTE — Progress Notes (Signed)
BP elevated and hydralazine given this am.   01/18/22 0533  Assess: MEWS Score  Temp 97.9 F (36.6 C)  BP (!) 179/59  MAP (mmHg) 92  Pulse Rate 63  ECG Heart Rate 63  Resp 19  SpO2 96 %  Assess: MEWS Score  MEWS Temp 0  MEWS Systolic 0  MEWS Pulse 0  MEWS RR 0  MEWS LOC 0  MEWS Score 0  MEWS Score Color Green  Assess: SIRS CRITERIA  SIRS Temperature  0  SIRS Pulse 0  SIRS Respirations  0  SIRS WBC 1  SIRS Score Sum  1

## 2022-01-18 NOTE — Progress Notes (Signed)
Physical Therapy Treatment Patient Details Name: Bethany Blake MRN: [id number] DOB: 1921-09-12 Today's Date: 01/18/2022   History of Present Illness pt is a 88 y/o female admitted 10/7 for worsening cough and mentation changes.  Daughter reports recent loose bowels/diarrhea plus recent visits from family members found to have COVID, and episodic confusion with general weakness. +COVID  PMHx, CKD, HTN, arthritis    PT Comments    Patient refused breakfast this a.m. Initiated session with LE exercises to allow pt to remain covered with blankets due to reports of being cold. Patient cooperative, but required AAROM for increased ROM. Once completed, attempted to persuade pt to mobilize to EOB and she repeatedly pulled her covers back over her body and refused. Explained risks of inactivity and pt responded, "I don't care!" Depressed affect reported to RN.     Recommendations for follow up therapy are one component of a multi-disciplinary discharge planning process, led by the attending physician.  Recommendations may be updated based on patient status, additional functional criteria and insurance authorization.  Follow Up Recommendations  Skilled nursing-short term rehab (<3 hours/day) Can patient physically be transported by private vehicle: No   Assistance Recommended at Discharge Intermittent Supervision/Assistance  Patient can return home with the following A little help with walking and/or transfers;A little help with bathing/dressing/bathroom;Assistance with cooking/housework;Assist for transportation;Help with stairs or ramp for entrance;Direct supervision/assist for medications management;Direct supervision/assist for financial management   Equipment Recommendations  None recommended by PT    Recommendations for Other Services       Precautions / Restrictions Precautions Precautions: Fall Restrictions Weight Bearing Restrictions: No     Mobility  Bed Mobility                General bed mobility comments: Refused all mobility due to being cold. Could not persuade to allow covers to be removed to get to EOB or OOB    Transfers                        Ambulation/Gait                   Stairs             Wheelchair Mobility    Modified Rankin (Stroke Patients Only)       Balance                                            Cognition Arousal/Alertness: Awake/alert Behavior During Therapy: Flat affect Overall Cognitive Status: No family/caregiver present to determine baseline cognitive functioning                                 General Comments: Pt oriented to person and place.  Not able to state reason for hospitalization. Folowing one step commands        Exercises Low Level/ICU Exercises Short Arc Quad: AAROM, Both, 10 reps Hip ABduction/ADduction: AAROM, Both, 10 reps Heel Slides: AAROM, Strengthening, Both, 10 reps (assisted flexion, resisted extension) Stabilized Bridging: 10 reps    General Comments General comments (skin integrity, edema, etc.): Per RN, pt refused to eat breakfast. Refusing to mobilize despite explanation of risks of inactivity. "I don't care!" Discussed with RN that this is a change in attitude from 10/09 evaluation  Pertinent Vitals/Pain Pain Assessment Pain Assessment: No/denies pain Faces Pain Scale: No hurt    Home Living                          Prior Function            PT Goals (current goals can now be found in the care plan section) Acute Rehab PT Goals Patient Stated Goal: wanted to get warm Time For Goal Achievement: 01/29/22 Potential to Achieve Goals: Good Progress towards PT goals: Not progressing toward goals - comment    Frequency    Min 2X/week      PT Plan Current plan remains appropriate;Frequency needs to be updated    Co-evaluation              AM-PAC PT "6 Clicks" Mobility   Outcome Measure   Help needed turning from your back to your side while in a flat bed without using bedrails?: A Lot Help needed moving from lying on your back to sitting on the side of a flat bed without using bedrails?: A Lot Help needed moving to and from a bed to a chair (including a wheelchair)?: A Lot Help needed standing up from a chair using your arms (e.g., wheelchair or bedside chair)?: A Lot Help needed to walk in hospital room?: Total Help needed climbing 3-5 steps with a railing? : Total 6 Click Score: 10    End of Session   Activity Tolerance: Patient tolerated treatment well Patient left: with call bell/phone within reach;in bed;with bed alarm set Nurse Communication: Other (comment) (depressed affect with refusal to get OOB or even EOB) PT Visit Diagnosis: Muscle weakness (generalized) (M62.81)     Time: 8144-8185 PT Time Calculation (min) (ACUTE ONLY): 19 min  Charges:  $Therapeutic Exercise: 8-22 mins                      [facility]  Office [phone]    Rexanne Mano 01/18/2022, 8:50 AM

## 2022-01-18 NOTE — Plan of Care (Signed)
Pt alert and oriented x 1. Med compliant. 1 prn given hydralazine for htn. Pt reluctant with turns. Foam on sacrum.. Problem: Education: Goal: Knowledge of risk factors and measures for prevention of condition will improve Outcome: Progressing   Problem: Coping: Goal: Psychosocial and spiritual needs will be supported Outcome: Progressing   Problem: Respiratory: Goal: Will maintain a patent airway Outcome: Progressing Goal: Complications related to the disease process, condition or treatment will be avoided or minimized Outcome: Progressing   Problem: Education: Goal: Knowledge of General Education information will improve Description: Including pain rating scale, medication(s)/side effects and non-pharmacologic comfort measures Outcome: Progressing   Problem: Health Behavior/Discharge Planning: Goal: Ability to manage health-related needs will improve Outcome: Progressing   Problem: Clinical Measurements: Goal: Ability to maintain clinical measurements within normal limits will improve Outcome: Progressing Goal: Will remain free from infection Outcome: Progressing Goal: Diagnostic test results will improve Outcome: Progressing Goal: Respiratory complications will improve Outcome: Progressing Goal: Cardiovascular complication will be avoided Outcome: Progressing   Problem: Activity: Goal: Risk for activity intolerance will decrease Outcome: Progressing   Problem: Nutrition: Goal: Adequate nutrition will be maintained Outcome: Progressing   Problem: Coping: Goal: Level of anxiety will decrease Outcome: Progressing   Problem: Elimination: Goal: Will not experience complications related to bowel motility Outcome: Progressing Goal: Will not experience complications related to urinary retention Outcome: Progressing   Problem: Pain Managment: Goal: General experience of comfort will improve Outcome: Progressing   Problem: Safety: Goal: Ability to remain free from  injury will improve Outcome: Progressing   Problem: Skin Integrity: Goal: Risk for impaired skin integrity will decrease Outcome: Progressing

## 2022-01-19 DIAGNOSIS — U071 COVID-19: Secondary | ICD-10-CM | POA: Diagnosis not present

## 2022-01-19 LAB — CBC WITH DIFFERENTIAL/PLATELET
Abs Immature Granulocytes: 0.03 10*3/uL (ref 0.00–0.07)
Basophils Absolute: 0 10*3/uL (ref 0.0–0.1)
Basophils Relative: 0 %
Eosinophils Absolute: 0 10*3/uL (ref 0.0–0.5)
Eosinophils Relative: 0 %
HCT: 24.7 % — ABNORMAL LOW (ref 36.0–46.0)
Hemoglobin: 8.2 g/dL — ABNORMAL LOW (ref 12.0–15.0)
Immature Granulocytes: 1 %
Lymphocytes Relative: 20 %
Lymphs Abs: 1 10*3/uL (ref 0.7–4.0)
MCH: 26.4 pg (ref 26.0–34.0)
MCHC: 33.2 g/dL (ref 30.0–36.0)
MCV: 79.4 fL — ABNORMAL LOW (ref 80.0–100.0)
Monocytes Absolute: 0.3 10*3/uL (ref 0.1–1.0)
Monocytes Relative: 7 %
Neutro Abs: 3.7 10*3/uL (ref 1.7–7.7)
Neutrophils Relative %: 72 %
Platelets: 393 10*3/uL (ref 150–400)
RBC: 3.11 MIL/uL — ABNORMAL LOW (ref 3.87–5.11)
RDW: 17.5 % — ABNORMAL HIGH (ref 11.5–15.5)
WBC: 5.1 10*3/uL (ref 4.0–10.5)
nRBC: 0 % (ref 0.0–0.2)

## 2022-01-19 LAB — COMPREHENSIVE METABOLIC PANEL
ALT: 14 U/L (ref 0–44)
AST: 21 U/L (ref 15–41)
Albumin: 2.2 g/dL — ABNORMAL LOW (ref 3.5–5.0)
Alkaline Phosphatase: 37 U/L — ABNORMAL LOW (ref 38–126)
Anion gap: 11 (ref 5–15)
BUN: 23 mg/dL (ref 8–23)
CO2: 23 mmol/L (ref 22–32)
Calcium: 8.8 mg/dL — ABNORMAL LOW (ref 8.9–10.3)
Chloride: 103 mmol/L (ref 98–111)
Creatinine, Ser: 0.85 mg/dL (ref 0.44–1.00)
GFR, Estimated: >60 mL/min (ref >60)
Glucose, Bld: 117 mg/dL — ABNORMAL HIGH (ref 70–99)
Potassium: 4.5 mmol/L (ref 3.5–5.1)
Sodium: 137 mmol/L (ref 135–145)
Total Bilirubin: 0.6 mg/dL (ref 0.3–1.2)
Total Protein: 4.4 g/dL — ABNORMAL LOW (ref 6.5–8.1)

## 2022-01-19 LAB — PHOSPHORUS: Phosphorus: 2.9 mg/dL (ref 2.5–4.6)

## 2022-01-19 LAB — FERRITIN: Ferritin: 12 ng/mL (ref 11–307)

## 2022-01-19 LAB — MAGNESIUM: Magnesium: 1.8 mg/dL (ref 1.7–2.4)

## 2022-01-19 LAB — C-REACTIVE PROTEIN: CRP: 1.6 mg/dL — ABNORMAL HIGH (ref <1.0)

## 2022-01-19 LAB — D-DIMER, QUANTITATIVE: D-Dimer, Quant: 3.56 ug/mL-FEU — ABNORMAL HIGH (ref 0.00–0.50)

## 2022-01-19 NOTE — Progress Notes (Signed)
Mobility Specialist Progress Note:   01/19/22 1120  Mobility  Activity Transferred from bed to chair  Level of Assistance Dependent, patient does less than 25% (+2)  Assistive Device Four wheel walker  Activity Response Tolerated fair  Mobility Referral Yes  $Mobility charge 1 Mobility   Pt agreeable to mobility session however guarded d/t previous falls. Required modA to get EOB. Pt soiled in BM, pericare performed. Pt unable to fully come to standing at this time, transferred to chair with total assist +2. Left with all needs met, daughter present.   Nelta Numbers Acute Rehab Secure Chat or Office Phone: [phone]

## 2022-01-19 NOTE — Progress Notes (Signed)
Mobility Specialist Progress Note:   01/19/22 1600  Mobility  Activity Transferred from chair to bed  Level of Assistance Dependent, patient does less than 25% (+2)  Activity Response Tolerated fair  $Mobility charge 1 Mobility   Pt requesting to transfer back to bed. Required totalA+2 to stand and pivot to bed. Left with all needs met, bed alarm on.  Nelta Numbers Acute Rehab Secure Chat or Office Phone: [phone]

## 2022-01-19 NOTE — Progress Notes (Signed)
Speech Language Pathology Treatment: Dysphagia  Patient Details Name: Bethany Blake MRN: [id number] DOB: 06/24/1921 Today's Date: 01/19/2022 Time: 1535-1550 SLP Time Calculation (min) (ACUTE ONLY): 15 min  Assessment / Plan / Recommendation Clinical Impression  Patient seen by SLP for skilled treatment focused on dysphagia goals. SLP updated patient's swallow safety signs to Dys 2 (minced) solids as SLP who conducted initial evaluation informed that her diet order in chart was mistakenly not changed from Dys 1(puree). Patient sitting in recliner and c/o back hurting. She did not want any PO's but was willing to drink several sips of water. One incident of mild, delayed cough but otherwise, patient appears to be tolerating diet well. RN reported that she took her medications without difficulty. SLP will continue to follow patient for diet toleration.    HPI HPI: Pt is a 88 y.o. female who was sent from facility for evaluation of worsening of cough and mentation changes. CXR negative. Dx COVID-19, acute metaboloic encephalopathy. PMH: dementia, HTN, CKD stage II, chronic hyponatremia, hypothyroidism.      SLP Plan  Continue with current plan of care      Recommendations for follow up therapy are one component of a multi-disciplinary discharge planning process, led by the attending physician.  Recommendations may be updated based on patient status, additional functional criteria and insurance authorization.    Recommendations  Diet recommendations: Dysphagia 2 (fine chop);Thin liquid Liquids provided via: Cup;Straw Medication Administration: Whole meds with puree Supervision: Staff to assist with self feeding Compensations: Minimize environmental distractions;Small sips/bites;Follow solids with liquid Postural Changes and/or Swallow Maneuvers: Seated upright 90 degrees                Oral Care Recommendations: Oral care BID Follow Up Recommendations: Skilled nursing-short term rehab  (<3 hours/day) Assistance recommended at discharge: Frequent or constant Supervision/Assistance SLP Visit Diagnosis: Dysphagia, oropharyngeal phase (R13.12) Plan: Continue with current plan of care           Sonia Baller, MA, CCC-SLP Speech Therapy

## 2022-01-19 NOTE — Progress Notes (Signed)
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  Right-sided pneumonia hip x-ray no fracture x-ray arm no radial fracture COVID-positive Admitted with COVID-placed on Decadron, remdesivir  Hospital-Problem based course  COVID-19 IV Decadron given initially transition to oral-stop date 10/16 Remdesivir 3 days completed on 10/10 All inflammatory markers have decreased except for D-dimer but that also is now lower than on admission-do not feel need to scan to rule out PE as not tachycardic not hypoxic  Likely aspiration pneumonia CXR right side 10/8 suggestive pneumonia placed on Unasyn and transition today to oral to complete on 10/13 Dysphagia 1 diet-needs reassessment as may be able to graduate diet as is not eating  Fall-no fractures on imaging Therapy is seen they recommend skilled  Toxic encephalopathy on admission probably from aspiration Palliative is seen and is DNR  No acute kidney injury CKD 2 Was on fluids initially-not sure if eating or drinking enough-check labs today May need to start fluids given slight rise in BUN in a.m.  Asymptomatic bacteriuria-no treatment at this time  Hypomagnesemia-resolved with replacement  Adult failure to thrive, protein energy malnutrition with BMI of 16 Expected given age   DVT prophylaxis: Lovenox Code Status: DNR Family Communication: None  present at the bedside Disposition:  Status is: Inpatient Remains inpatient appropriate because:  Patient requires isolation until skilled facility can accept-otherwise seems to have stabilized for discharge and this is an unavoidable barrier   Consultants:  None  Procedures: No  Antimicrobials: Current Augmentin   Subjective: She is a little hard of hearing-she is asking to get up-she has not really touched her food  Objective: Vitals:   01/17/22 2100 01/18/22 0533 01/18/22 2225 01/19/22 0617  BP: (!) 151/72 (!) 179/59 (!) 127/58 (!) 185/81  Pulse: 67 63 73 75  Resp: 20 19 20 18   Temp: 98 F (36.7 C) 97.9 F (36.6 C) 98.1 F (36.7 C) 98.6 F (37 C)  TempSrc: Oral Oral Oral Oral  SpO2: 98% 96% 96% 98%  Weight:    47.6 kg  Height:        Intake/Output Summary (Last 24 hours) at 01/19/2022 0741 Last data filed at 01/18/2022 1431 Gross per 24 hour  Intake 320 ml  Output 500 ml  Net -180 ml   Filed Weights   01/14/22 1809 01/19/22 0617  Weight: 42.7 kg 47.6 kg    Examination:  EOMI NCAT no focal deficit Chest is clear Abdomen is soft No lower extremity edema S1-S2 no murmur-NSR/PVC on monitors   Data Reviewed: personally reviewed   CBC    Component Value Date/Time   WBC 5.1 01/19/2022 0352   RBC 3.11 (L) 01/19/2022 0352   HGB 8.2 (L) 01/19/2022 0352   HCT 24.7 (L) 01/19/2022 0352   PLT 393 01/19/2022 0352   MCV 79.4 (L) 01/19/2022 0352   MCH 26.4 01/19/2022 0352   MCHC 33.2 01/19/2022 0352   RDW 17.5 (H) 01/19/2022 0352   LYMPHSABS 1.0 01/19/2022 0352  MONOABS 0.3 01/19/2022 0352   EOSABS 0.0 01/19/2022 0352   BASOSABS 0.0 01/19/2022 0352      Latest Ref Rng & Units 01/19/2022    3:52 AM 01/18/2022    8:08 AM 01/17/2022    4:00 AM  CMP  Glucose 70 - 99 mg/dL 117  96  129   BUN 8 - 23 mg/dL 23  20  20    Creatinine 0.44 - 1.00 mg/dL 0.85  0.69  0.70   Sodium 135 - 145 mmol/L 137  137  135   Potassium 3.5 - 5.1 mmol/L 4.5  4.3  4.5    Chloride 98 - 111 mmol/L 103  106  105   CO2 22 - 32 mmol/L 23  24  21    Calcium 8.9 - 10.3 mg/dL 8.8  8.2  8.4   Total Protein 6.5 - 8.1 g/dL 4.4  4.8  4.7   Total Bilirubin 0.3 - 1.2 mg/dL 0.6  0.3  0.5   Alkaline Phos 38 - 126 U/L 37  37  34   AST 15 - 41 U/L 21  19  14    ALT 0 - 44 U/L 14  14  11       Radiology Studies: DG Swallowing Func-Speech Pathology  Result Date: 01/17/2022 Table formatting from the original result was not included. Images from the original result were not included. Objective Swallowing Evaluation: Type of Study: MBS-Modified Barium Swallow Study  Patient Details Name: Bethany Blake MRN: [id number] Date of Birth: 02-27-1922 Today's Date: 01/17/2022 Time: SLP Start Time (ACUTE ONLY): 1527 -SLP Stop Time (ACUTE ONLY): 1543 SLP Time Calculation (min) (ACUTE ONLY): 16 min Past Medical History: Past Medical History: Diagnosis Date  Anxiety   Arthritis   Chronic kidney disease   Hypertension   Hypokalemia   Hyponatremia   Hypothyroidism  Past Surgical History: Past Surgical History: Procedure Laterality Date  SHOULDER SURGERY    WRIST SURGERY   HPI: Pt is a 88 y.o. female who was sent from facility for evaluation of worsening of cough and mentation changes. CXR negative. Dx COVID-19, acute metaboloic encephalopathy. PMH: dementia, HTN, CKD stage II, chronic hyponatremia, hypothyroidism.  No data recorded  Recommendations for follow up therapy are one component of a multi-disciplinary discharge planning process, led by the attending physician.  Recommendations may be updated based on patient status, additional functional criteria and insurance authorization. Assessment / Plan / Recommendation   01/17/2022   3:11 PM Clinical Impressions Clinical Impression Pt presents with oropharyngeal dysphagia characterized by impaired posterior propulsion, a pharyngeal delay and reduction in bolus cohesion, tongue base retraction, and pharyngeal constriction. She demonstrated lingual pumping,  premature spillage to the valleculae, vallecular residue and posterior pharyngeal wall residue. Residue was improved with a liquid wash. Penetration (PAS 2; considered WNL) was noted with thin liquids via cup and this progressed to PAS 3 with consecutive swallows of thin liquids when straws were used. A single instance of trace aspiration (PAS 7) was noted after penetration (PAS 3) of thin liquids via straw. This triggered subtle coughing which was ineffective, but aspirated material was expelled with prompts for pt to cough more. Transport of the barium tablet was halted at the level of the valleculae and upper esophagus, but movement was facilitated with additional boluses of thin liquids. A dysphagia 2 diet with thin liquids is recommended at this time with strict observance of swallowing precautions. SLP continue to follow pt. SLP Visit Diagnosis Dysphagia, oropharyngeal phase (R13.12) Impact on safety and  function Mild aspiration risk     01/17/2022   3:11 PM Treatment Recommendations Treatment Recommendations Therapy as outlined in treatment plan below     01/17/2022   3:11 PM Prognosis Prognosis for Safe Diet Advancement Good Barriers to Reach Goals Cognitive deficits   01/17/2022   3:11 PM Diet Recommendations SLP Diet Recommendations Dysphagia 2 (Fine chop) solids;Thin liquid Liquid Administration via Cup;No straw Medication Administration Whole meds with puree Compensations Minimize environmental distractions;Small sips/bites;Follow solids with liquid Postural Changes Seated upright at 90 degrees     01/17/2022   3:11 PM Other Recommendations Oral Care Recommendations Oral care BID Follow Up Recommendations Skilled nursing-short term rehab (<3 hours/day) Assistance recommended at discharge Frequent or constant Supervision/Assistance Functional Status Assessment Patient has had a recent decline in their functional status and demonstrates the ability to make significant improvements in function in a  reasonable and predictable amount of time.   01/17/2022   3:11 PM Frequency and Duration  Speech Therapy Frequency (ACUTE ONLY) min 2x/week Treatment Duration 2 weeks     01/17/2022   3:11 PM Oral Phase Oral Phase Impaired Oral - Nectar Straw Decreased bolus cohesion;Premature spillage;Reduced posterior propulsion Oral - Thin Cup Decreased bolus cohesion;Premature spillage;Reduced posterior propulsion Oral - Thin Straw Decreased bolus cohesion;Premature spillage;Reduced posterior propulsion Oral - Puree Delayed oral transit;Decreased bolus cohesion;Premature spillage;Reduced posterior propulsion;Lingual pumping Oral - Mech Soft Delayed oral transit;Decreased bolus cohesion;Premature spillage;Reduced posterior propulsion;Lingual pumping;Impaired mastication Oral - Pill Delayed oral transit;Decreased bolus cohesion;Premature spillage;Reduced posterior propulsion;Lingual pumping    01/17/2022   3:11 PM Pharyngeal Phase Pharyngeal Phase Impaired Pharyngeal- Nectar Straw Reduced tongue base retraction;Reduced pharyngeal peristalsis;Delayed swallow initiation-vallecula;Pharyngeal residue - valleculae;Pharyngeal residue - posterior pharnyx Pharyngeal- Thin Cup Reduced tongue base retraction;Reduced pharyngeal peristalsis;Delayed swallow initiation-vallecula;Pharyngeal residue - valleculae;Pharyngeal residue - posterior pharnyx;Penetration/Aspiration during swallow Pharyngeal Material enters airway, remains ABOVE vocal cords then ejected out Pharyngeal- Thin Straw Reduced tongue base retraction;Reduced pharyngeal peristalsis;Delayed swallow initiation-vallecula;Pharyngeal residue - valleculae;Pharyngeal residue - posterior pharnyx Pharyngeal Material enters airway, remains ABOVE vocal cords and not ejected out Pharyngeal- Puree Reduced tongue base retraction;Reduced pharyngeal peristalsis;Delayed swallow initiation-vallecula;Pharyngeal residue - valleculae;Pharyngeal residue - posterior pharnyx Pharyngeal- Mechanical Soft  Reduced tongue base retraction;Reduced pharyngeal peristalsis;Delayed swallow initiation-vallecula;Pharyngeal residue - valleculae;Pharyngeal residue - posterior pharnyx Pharyngeal- Pill Reduced tongue base retraction;Reduced pharyngeal peristalsis;Delayed swallow initiation-vallecula;Pharyngeal residue - valleculae;Pharyngeal residue - posterior pharnyx     No data to display    Shanika I. Hardin Negus, MS, [place] Office number [phone] Horton Marshall 01/17/2022, 4:05 PM                       Scheduled Meds:  amoxicillin-clavulanate  1 tablet Oral Q12H   dexamethasone  6 mg Oral Daily   enoxaparin (LOVENOX) injection  30 mg Subcutaneous Q24H   feeding supplement  237 mL Oral BID BM   hydrALAZINE  50 mg Oral TID WC   levothyroxine  112 mcg Oral Q0600   multivitamin with minerals  1 tablet Oral Daily   Continuous Infusions:   LOS: 5 days   Time spent: [place], MD [facility] To contact the attending provider between 7A-7P or the covering provider during after hours 7P-7A, please log into the web site [email or web address] and access using universal [place] password for that web site. If you do not have the password, please call the hospital operator.  01/19/2022, 7:41 AM

## 2022-01-20 DIAGNOSIS — Z515 Encounter for palliative care: Secondary | ICD-10-CM | POA: Diagnosis not present

## 2022-01-20 DIAGNOSIS — Z7189 Other specified counseling: Secondary | ICD-10-CM | POA: Diagnosis not present

## 2022-01-20 NOTE — Progress Notes (Signed)
Palliative Medicine Inpatient Follow Up Note HPI: Patient is a 88 year old female with HTN, CKD stage II, chronic hyponatremia, hypothyroidism presented with worsening cough and mentation changes. Patient is from [place], independent living facility and had an unwitnessed fall on the morning of admission, fell on her left side.  Had diarrhea one week ago and have been in contacts with family who have [place]. Patient with covid 19 pneumonia. Palliative care requested for further [place] conversations.    Today's Discussion 01/18/2022   *Please note that this is a verbal dictation therefore any spelling or grammatical errors are due to the "[place] [facility]" system interpretation.   Chart reviewed inclusive of vital signs, progress notes, laboratory results, and diagnostic images.    I met with Lasonja and her daughter, Margarita Grizzle at bedside. I was joined by patients RN, [employer]. We were able to get Aleia up and out of bed. She needed a fair amount of assistance. She was able to cognitively recognize moving her walker and pivoting to the chair. She followed prompting well.   A MOST for was completed with patients daughter as below:  Cardiopulmonary Resuscitation: Do Not Attempt Resuscitation (DNR/No CPR)  Medical Interventions: Limited Additional Interventions: Use medical treatment, IV fluids and cardiac monitoring as indicated, DO NOT USE intubation or mechanical ventilation. May consider use of less invasive airway support such as BiPAP or CPAP. Also provide comfort measures. Transfer to the hospital if indicated. Avoid intensive care.   Antibiotics: Antibiotics if indicated  IV Fluids: IV fluids if indicated  Feeding Tube: No feeding tube     Questions and concerns addressed/Palliative Support Provided.    Objective Assessment: Vital Signs     Vitals:    01/17/22 2100 01/18/22 0533  BP: (!) 151/72 (!) 179/59  Pulse: 67 63  Resp: 20 19  Temp: 98 F (36.7 C) 97.9 F (36.6 C)  SpO2:  98% 96%      Intake/Output Summary (Last 24 hours) at 01/18/2022 0708 Last data filed at 01/18/2022 0533    Gross per 24 hour  Intake 240 ml  Output 800 ml  Net -560 ml    Last Weight  Most recent update: 01/14/2022  6:26 PM      Weight  42.7 kg (94 lb 2.2 oz)                   Gen: Frail elderly Caucasian female in no acute distress HEENT: moist mucous membranes CV: Regular rate and rhythm PULM: On room air breathing is even and nonlabored ABD: soft/nontender EXT: No edema Neuro: Alert and oriented x2 - quite hard of hearing   SUMMARY OF RECOMMENDATIONS   DNAR/DNI  MOST Completed, paper copy placed onto the chart electric copy can be found in [place]  DNR Form Completed, paper copy placed onto the chart electric copy can be found in [place]    Goals remain for improvement   Plan for short-term skilled nursing facility placement at [facility]   Patient's daughter does agree to outpatient palliative support   Ongoing incremental palliative care support   Time Spent: 62   Billing based on MDM: High   Problems Addressed: One acute or chronic illness or injury that poses a threat to life or bodily function   Amount and/or Complexity of Data: Category 3:Discussion of management or test interpretation with external physician/other qualified health care professional/appropriate source (not separately reported)   Risks: Decision regarding hospitalization or escalation of hospital care and Decision not to resuscitate  or to de-escalate care because of poor prognosis ______________________________________________________________________________________ [place] Team Team Cell Phone: [phone] Please utilize secure chat with additional questions, if there is no response within 30 minutes please call the above phone number   Palliative Medicine Team providers are available by phone from 7am to 7pm daily and can be reached through the  team cell phone.  Should this patient require assistance outside of these hours, please call the patient's attending physician.

## 2022-01-20 NOTE — Progress Notes (Signed)
Occupational Therapy Treatment Patient Details Name: Bethany Blake MRN: [id number] DOB: Oct 04, 1921 Today's Date: 01/20/2022   History of present illness pt is a 88 y/o female admitted 10/7 for worsening cough and mentation changes.  Daughter reports recent loose bowels/diarrhea plus recent visits from family members found to have COVID, and episodic confusion with general weakness. +COVID  PMHx, CKD, HTN, arthritis   OT comments  Pt currently needing total +2 for supine to sit secondary to decreased willingness to participate.  Once on EOB she needed mod +2 for sit to stand and standing in order to take steps up toward the top of the bed.  Pt currently less participatory with decreased orientation compared on initial evaluation.  Recommend continued acute care OT to help increase balance and independence with basic selfcare tasks and toileting.  Will need 24 hr assist post discharge.     Recommendations for follow up therapy are one component of a multi-disciplinary discharge planning process, led by the attending physician.  Recommendations may be updated based on patient status, additional functional criteria and insurance authorization.    Follow Up Recommendations  Skilled nursing-short term rehab (<3 hours/day)    Assistance Recommended at Discharge Frequent or constant Supervision/Assistance  Patient can return home with the following  A lot of help with bathing/dressing/bathroom;Assistance with cooking/housework;Assist for transportation;Direct supervision/assist for medications management;Direct supervision/assist for financial management;A lot of help with walking and/or transfers   Equipment Recommendations  None recommended by OT       Precautions / Restrictions Precautions Precautions: Fall Restrictions Weight Bearing Restrictions: No       Mobility Bed Mobility Overal bed mobility: Needs Assistance Bed Mobility: Supine to Sit, Sit to Supine     Supine to sit: Max  assist, +2 for physical assistance Sit to supine: Mod assist   General bed mobility comments: Therapist had to provide total assist +2 for supine to sit secondary to pt not wanting to initiate task.  When getting back in the bed she needed mod assist for lifting LEs and for straightening her trunk.    Transfers Overall transfer level: Needs assistance Equipment used: Rollator (4 wheels) Transfers: Sit to/from Stand Sit to Stand: Mod assist, +2 physical assistance           General transfer comment: Increased posterior lean in standing with flexted trunk and knees.     Balance Overall balance assessment: Needs assistance Sitting-balance support: Feet supported, Bilateral upper extremity supported Sitting balance-Leahy Scale: Poor Sitting balance - Comments: Posterior lean initially which improved over time while donning her slip on shoes.   Standing balance support: Reliant on assistive device for balance Standing balance-Leahy Scale: Poor Standing balance comment: Pt needs support from the rollator as well as therapist assist secondary to posterior lean                           ADL either performed or assessed with clinical judgement   ADL Overall ADL's : Needs assistance/impaired     Grooming: Wash/dry face;Minimal assistance;Bed level               Lower Body Dressing: Maximal assistance Lower Body Dressing Details (indicate cue type and reason): donning slip on shoes sitting EOB Toilet Transfer: Moderate assistance;+2 for physical assistance Toilet Transfer Details (indicate cue type and reason): simulated, pt declined transfer to the bathroom         Functional mobility during ADLs: Rollator (4 wheels) (to stand  and take a couple steps up toward the top of the bed) General ADL Comments: Pt not as oriented and participatory this session compared to last OT session.  Max demonstrational cueing to transfer to the EOB with max persuading to attempt  standing to take a few steps up toward the top of the bed.  Increased posterior lean noted in standing with flexed trunk.      Cognition Arousal/Alertness: Awake/alert Behavior During Therapy: Flat affect, Agitated Overall Cognitive Status: No family/caregiver present to determine baseline cognitive functioning                                 General Comments: Pt was not oriented to place, time, or situation.  Decreased awareness of situation with increased agitation when therapist and mobility specialist attempted to work with her "I'm not moving".                   Pertinent Vitals/ Pain       Pain Assessment Pain Assessment: No/denies pain Faces Pain Scale: No hurt         Frequency  Min 2X/week        Progress Toward Goals  OT Goals(current goals can now be found in the care plan section)  Progress towards OT goals: Progressing toward goals  Acute Rehab OT Goals Potential to Achieve Goals: Good  Plan Discharge plan remains appropriate    Co-evaluation    PT/OT/SLP Co-Evaluation/Treatment: Yes            AM-PAC OT "6 Clicks" Daily Activity     Outcome Measure   Help from another person eating meals?: None Help from another person taking care of personal grooming?: A Little Help from another person toileting, which includes using toliet, bedpan, or urinal?: A Lot Help from another person bathing (including washing, rinsing, drying)?: A Lot Help from another person to put on and taking off regular upper body clothing?: A Lot Help from another person to put on and taking off regular lower body clothing?: A Lot 6 Click Score: 15    End of Session Equipment Utilized During Treatment: Rollator (4 wheels)  OT Visit Diagnosis: Unsteadiness on feet (R26.81);Muscle weakness (generalized) (M62.81);Other symptoms and signs involving cognitive function;Other abnormalities of gait and mobility (R26.89)   Activity Tolerance Treatment limited  secondary to agitation   Patient Left in bed;with call bell/phone within reach;with bed alarm set   Nurse Communication Mobility status        Time: 1437-1500 OT Time Calculation (min): 23 min  Charges: OT General Charges $OT Visit: 1 Visit OT Treatments $Self Care/Home Management : 23-37 mins  Kelsey Durflinger OTR/L 01/20/2022, 4:13 PM

## 2022-01-20 NOTE — TOC Progression Note (Signed)
Transition of Care [facility]) - Initial/Assessment Note    Patient Details  Name: Bethany Blake MRN: [id number] Date of Birth: 13-Feb-1922  Transition of Care [facility]) CM/SW Contact:    Milinda Antis, [place] Phone Number: 01/20/2022, 10:32 AM  Clinical Narrative:                 Patient can d/c to [facility] on 10/17 after 10 days of quarantine.    TOC will continue to follow and request insurance auth. closer to the above date.         Patient Goals and CMS Choice        Expected Discharge Plan and Services                                                Prior Living Arrangements/Services                       Activities of Daily Living   ADL Screening (condition at time of admission) Patient's cognitive ability adequate to safely complete daily activities?: No Is the patient deaf or have difficulty hearing?: Yes Does the patient have difficulty seeing, even when wearing glasses/contacts?: No Does the patient have difficulty concentrating, remembering, or making decisions?: Yes Patient able to express need for assistance with ADLs?: No Does the patient have difficulty dressing or bathing?: Yes Independently performs ADLs?: No Communication: Independent Dressing (OT): Needs assistance Is this a change from baseline?: Pre-admission baseline Grooming: Needs assistance Is this a change from baseline?: Pre-admission baseline Feeding: Needs assistance Is this a change from baseline?: Pre-admission baseline Bathing: Needs assistance Is this a change from baseline?: Pre-admission baseline Toileting: Needs assistance Is this a change from baseline?: Pre-admission baseline In/Out Bed: Needs assistance Is this a change from baseline?: Pre-admission baseline Walks in Home: Independent with device (comment), Needs assistance Is this a change from baseline?: Pre-admission baseline Does the patient have difficulty walking or climbing stairs?: Yes Weakness  of Legs: Both Weakness of Arms/Hands: Both  Permission Sought/Granted                  Emotional Assessment              Admission diagnosis:  Fall, initial encounter [W19.XXXA] Pneumonia of right lung due to infectious organism, unspecified part of lung [J18.9] COVID [U07.1] COVID-19 [U07.1] Patient Active Problem List   Diagnosis Date Noted   Aspiration pneumonia ([place]) 01/15/2022   Pneumonia due to COVID-19 virus 01/14/2022   COVID 01/14/2022   Syncope 06/19/2021   Normocytic anemia 06/19/2021   Stage 3a chronic kidney disease (CKD) ([place]) 06/19/2021   Subdural hematoma ([place]) 06/19/2021   Ulcer of heel and midfoot ([place]) 02/27/2020   E-coli UTI    Acute encephalopathy 01/23/2020   Urinary tract infection without hematuria    Bilateral subdural hematomas ([place]) 12/31/2019   Subarachnoid hemorrhage ([place]) 12/31/2019   Hypothyroidism 12/31/2019   Fall at home, initial encounter 12/31/2019   Aneurysm of right internal carotid artery 12/31/2019   Physical deconditioning 09/02/2014   SIADH (syndrome of inappropriate ADH production) ([place]) 08/28/2014   Essential hypertension 08/28/2014   Confusion 08/28/2014   AKI (acute kidney injury) ([place]) 08/28/2014   Hypochloremia 08/28/2014   PCP:  [name], MD Pharmacy:   [place], [place] - [address] [place] [address] Phone: [phone] Fax: [phone]     Social Determinants of Health (SDOH) Interventions    Readmission Risk Interventions     No data to display

## 2022-01-20 NOTE — Plan of Care (Signed)
  Problem: Respiratory: Goal: Will maintain a patent airway Outcome: Progressing   Problem: Coping: Goal: Level of anxiety will decrease Outcome: Progressing   Problem: Elimination: Goal: Will not experience complications related to bowel motility Outcome: Progressing   Problem: Activity: Goal: Risk for activity intolerance will decrease Outcome: Not Progressing   Problem: Nutrition: Goal: Adequate nutrition will be maintained Outcome: Not Progressing

## 2022-01-20 NOTE — Progress Notes (Addendum)
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  Right-sided pneumonia hip x-ray no fracture x-ray arm no radial fracture COVID-positive Admitted with COVID-placed on Decadron, remdesivir  Hospital-Problem based course  COVID-19 IV Decadron given initially ---on orals-stop date 10/16 Remdesivir 3 days completed on 10/10 Continues to improve without tachycardia or hypoxia--no CT at this time, despite elevated dimer  Likely aspiration pneumonia CXR right side 10/8 suggestive pneumonia Completed abx 10/13 SLP graduatedto Dys -2  Fall-no fractures on imaging Therapy is seen they recommend skilled  Toxic encephalopathy on admission probably from aspiration Palliative is seen and is DNR Her mental status has improved  No acute kidney injury CKD 2 Was on fluids initially-not sure if eating or drinking enough-check labs today May need to start fluids given slight rise in BUN in a.m.  Asymptomatic bacteriuria-no treatment at this time  Hypomagnesemia-resolved with replacement  Adult failure to thrive, protein energy malnutrition with BMI of 16 Expected given age   DVT prophylaxis: Lovenox Code Status: DNR Family Communication: discussed with daughter Laurie--[phone] Disposition:  Status is: Inpatient Remains inpatient appropriate because:  Patient requires isolation until skilled facility  can accept-otherwise seems to have stabilized for discharge and this is an unavoidable barrier   Consultants:  None  Procedures: No  Antimicrobials: Current Augmentin   Subjective:  Sleepy but rousable No distress Ate some No family + No pain fever chills  Objective: Vitals:   01/20/22 0511 01/20/22 0806 01/20/22 1203 01/20/22 1213  BP: (!) 174/60 (!) 161/56 (!) 99/53 117/77  Pulse: 62 60 91   Resp: 16 20 18    Temp: (!) 97.4 F (36.3 C) 97.8 F (36.6 C)    TempSrc: Oral Oral    SpO2: 96% 98% 97%   Weight:      Height:        Intake/Output Summary (Last 24 hours) at 01/20/2022 1512 Last data filed at 01/20/2022 1300 Gross per 24 hour  Intake 700 ml  Output 400 ml  Net 300 ml    Filed Weights   01/14/22 1809 01/19/22 0617  Weight: 42.7 kg 47.6 kg    Examination:  EOMI NCAT no focal deficit Chest without rales rhonchi Abdomen is soft-slight distended nor ebound No lower extremity edema S1-S2 no murmur-NSR/PVC on monitors   Data Reviewed: personally reviewed   CBC    Component Value Date/Time   WBC 5.1 01/19/2022 0352   RBC 3.11 (L) 01/19/2022 0352   HGB 8.2 (L) 01/19/2022 0352   HCT 24.7 (L) 01/19/2022 0352   PLT 393 01/19/2022 0352   MCV 79.4 (L) 01/19/2022 0352   MCH 26.4 01/19/2022 0352   MCHC 33.2 01/19/2022 0352   RDW 17.5 (H) 01/19/2022 0352   LYMPHSABS 1.0 01/19/2022 0352   MONOABS 0.3 01/19/2022 0352   EOSABS 0.0 01/19/2022 0352   BASOSABS 0.0 01/19/2022 0352      Latest Ref Rng & Units 01/19/2022    3:52 AM 01/18/2022    8:08 AM  01/17/2022    4:00 AM  CMP  Glucose 70 - 99 mg/dL 117  96  129   BUN 8 - 23 mg/dL 23  20  20    Creatinine 0.44 - 1.00 mg/dL 0.85  0.69  0.70   Sodium 135 - 145 mmol/L 137  137  135   Potassium 3.5 - 5.1 mmol/L 4.5  4.3  4.5   Chloride 98 - 111 mmol/L 103  106  105   CO2 22 - 32 mmol/L 23  24  21    Calcium 8.9 - 10.3 mg/dL 8.8  8.2  8.4   Total Protein 6.5 - 8.1 g/dL 4.4  4.8  4.7   Total Bilirubin  0.3 - 1.2 mg/dL 0.6  0.3  0.5   Alkaline Phos 38 - 126 U/L 37  37  34   AST 15 - 41 U/L 21  19  14    ALT 0 - 44 U/L 14  14  11       Radiology Studies: No results found.   Scheduled Meds:  amoxicillin-clavulanate  1 tablet Oral Q12H   dexamethasone  6 mg Oral Daily   enoxaparin (LOVENOX) injection  30 mg Subcutaneous Q24H   feeding supplement  237 mL Oral BID BM   hydrALAZINE  50 mg Oral TID WC   levothyroxine  112 mcg Oral Q0600   multivitamin with minerals  1 tablet Oral Daily   Continuous Infusions:   LOS: 6 days   Time spent: 24  Nita Sells, MD [facility] To contact the attending provider between 7A-7P or the covering provider during after hours 7P-7A, please log into the web site [email or web address] and access using universal [place] password for that web site. If you do not have the password, please call the hospital operator.  01/20/2022, 3:12 PM

## 2022-01-21 LAB — GLUCOSE, CAPILLARY
Glucose-Capillary: 81 mg/dL (ref 70–99)
Glucose-Capillary: 97 mg/dL (ref 70–99)
Glucose-Capillary: 139 mg/dL — ABNORMAL HIGH (ref 70–99)

## 2022-01-21 MED ORDER — PREGABALIN 50 MG PO CAPS
50.0000 mg | ORAL_CAPSULE | Freq: Two times a day (BID) | ORAL | Status: DC
  Administered 2022-01-21 – 2022-01-23 (×4): 50 mg via ORAL
  Filled 2022-01-21 (×4): qty 1

## 2022-01-21 NOTE — Progress Notes (Addendum)
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  Right-sided pneumonia hip x-ray no fracture x-ray arm no radial fracture COVID-positive Admitted with COVID-placed on Decadron, remdesivir  Hospital-Problem based course  COVID-19 IV Decadron given initially ---on orals-stop date 10/16 Remdesivir 3 days completed on 10/10 Continues to improve without tachycardia or hypoxia--no CT needed at this time, despite elevated dimer  Likely aspiration pneumonia CXR right side 10/8 suggestive pneumonia Completed abx 10/13 SLP graduated to Dys -2--tolerating fair  Fall-no fractures on imaging Therapy is seen they recommend skilled  Toxic encephalopathy on admission probably from aspiration Palliative following DNR confirmed Her mental status has improved  No acute kidney injury CKD 2 Stable at this time-periodic labs  Asymptomatic bacteriuria-no treatment at this time  Hypomagnesemia-resolved with replacement  Adult failure to thrive, protein energy malnutrition with BMI of 16 Expected given age   DVT prophylaxis: Lovenox Code Status: DNR Family Communication: discussed with daughter Laurie--[phone] Disposition:  Status is: Inpatient Remains inpatient appropriate because:  Patient requires isolation until skilled facility can accept-otherwise seems to have stabilized for discharge and this is  an unavoidable barrier   Consultants:  None  Procedures: No  Antimicrobials: Current Augmentin   Subjective:  Well Very HOH Doesnt know the day foll command--not on oxygen no resp symp  Objective: Vitals:   01/20/22 2250 01/21/22 0450 01/21/22 0953 01/21/22 1305  BP: 130/65 (!) 176/63 (!) 159/83 (!) 104/47  Pulse: 87 66 75 83  Resp:   18 18  Temp: 98.7 F (37.1 C) 98.3 F (36.8 C) 97.9 F (36.6 C) 98.4 F (36.9 C)  TempSrc: Oral Oral Oral Oral  SpO2: 96% 96% 99% 97%  Weight:      Height:        Intake/Output Summary (Last 24 hours) at 01/21/2022 1558 Last data filed at 01/21/2022 0823 Gross per 24 hour  Intake 360 ml  Output 700 ml  Net -340 ml    Filed Weights   01/14/22 1809 01/19/22 0617  Weight: 42.7 kg 47.6 kg    Examination:  EOMI NCAT no focal deficit Chest clear no wheeze Abdomen is soft-mild distension No lower extremity edema S1-S2 no murmur-Non tele   Data Reviewed: personally reviewed   CBC    Component Value Date/Time   WBC 5.1 01/19/2022 0352   RBC 3.11 (L) 01/19/2022 0352   HGB 8.2 (L) 01/19/2022 0352   HCT 24.7 (L) 01/19/2022 0352   PLT 393 01/19/2022 0352   MCV 79.4 (L) 01/19/2022 0352   MCH 26.4 01/19/2022 0352   MCHC 33.2 01/19/2022 0352   RDW 17.5 (H) 01/19/2022 0352   LYMPHSABS 1.0 01/19/2022 0352   MONOABS 0.3 01/19/2022 0352   EOSABS 0.0 01/19/2022 0352   BASOSABS 0.0 01/19/2022 0352      Latest Ref Rng & Units 01/19/2022    3:52 AM 01/18/2022    8:08 AM 01/17/2022    4:00 AM  CMP  Glucose 70 - 99 mg/dL 117  96  129   BUN 8 - 23 mg/dL 23  20  20    Creatinine 0.44 - 1.00 mg/dL  8.41  6.60   Sodium 135 - 145 mmol/L 137  137  135   Potassium 3.5 - 5.1 mmol/L 4.5  4.3  4.5   Chloride 98 - 111 mmol/L 103  106  105   CO2 22 - 32 mmol/L 23  24  21    Calcium 8.9 - 10.3 mg/dL 8.8  8.2  8.4   Total Protein 6.5 - 8.1 g/dL 4.4  4.8  4.7   Total Bilirubin 0.3 - 1.2 mg/dL 0.6  0.3  0.5   Alkaline Phos 38 - 126 U/L  37  37  34   AST 15 - 41 U/L 21  19  14    ALT 0 - 44 U/L 14  14  11       Radiology Studies: No results found.   Scheduled Meds:  amoxicillin-clavulanate  1 tablet Oral Q12H   dexamethasone  6 mg Oral Daily   enoxaparin (LOVENOX) injection  30 mg Subcutaneous Q24H   feeding supplement  237 mL Oral BID BM   hydrALAZINE  50 mg Oral TID WC   levothyroxine  112 mcg Oral Q0600   multivitamin with minerals  1 tablet Oral Daily   Continuous Infusions:   LOS: 7 days   Time spent: 14  6.30, MD [facility] To contact the attending provider between 7A-7P or the covering provider during after hours 7P-7A, please log into the web site [email or web address] and access using universal [place] password for that web site. If you do not have the password, please call the hospital operator.  01/21/2022, 3:58 PM

## 2022-01-21 NOTE — Progress Notes (Signed)
Mobility Specialist Progress Note:   01/21/22 1341  Mobility  Activity Ambulated with assistance in room  Level of Assistance +2 (takes two people) (MaxA)  Assistive Device Front wheel walker  Distance Ambulated (ft) 2 ft  Activity Response Tolerated well  Mobility Referral Yes  $Mobility charge 1 Mobility   Pt requesting transfer from chair to bed. Required +2 with MaxA to stand and ambulate to bed. Left with all needs met and call bell in reach.    Mobility Specialist-Acute Rehab Secure Chat only  

## 2022-01-22 LAB — GLUCOSE, CAPILLARY
Glucose-Capillary: 75 mg/dL (ref 70–99)
Glucose-Capillary: 104 mg/dL — ABNORMAL HIGH (ref 70–99)
Glucose-Capillary: 110 mg/dL — ABNORMAL HIGH (ref 70–99)

## 2022-01-22 MED ORDER — DEXAMETHASONE 6 MG PO TABS: 6.0000 mg | ORAL_TABLET | Freq: Every day | ORAL | 0 refills | Status: DC

## 2022-01-22 MED ORDER — GUAIFENESIN-DM 100-10 MG/5ML PO SYRP: 10.0000 mL | ORAL_SOLUTION | ORAL | 0 refills | Status: DC | PRN

## 2022-01-22 MED ORDER — PREGABALIN 50 MG PO CAPS: 50.0000 mg | ORAL_CAPSULE | Freq: Two times a day (BID) | ORAL | 0 refills | Status: DC

## 2022-01-22 NOTE — Discharge Summary (Signed)
Physician Discharge Summary  Bethany Blake KZS:[id number] DOB: [birth year] DOA: 01/14/2022  PCP: [name], MD  Admit date: 01/14/2022 Discharge date: 01/22/2022  Time spent: 27 minutes  Recommendations for Outpatient Follow-up:  Note dosage changes of medications discontinued salt tablets gabapentin changed to Lyrica, will need 1 more day of Decadron on discharge Requires CBC Chem-12 in about 1 week at facility  Discharge Diagnoses:  MAIN problem for hospitalization   COVID-19 pneumonia on admission Superimposed aspiration pneumonia completing therapy CKD 2 Hypomagnesemia  Please see below for itemized issues addressed in [facility]- refer to other progress notes for clarity if needed  Discharge Condition:   Fair  Diet recommendation:   Dysphagia 2  Filed Weights   01/14/22 1809 01/19/22 0617  Weight: 42.7 kg 47.6 kg    History of present illness:  88 year old independent living [place]] female 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  Right-sided pneumonia hip x-ray no fracture x-ray arm no radial fracture COVID-positive Admitted with COVID-placed on Decadron, remdesivir  Hospital Course:  COVID-19 IV Decadron given initially ---on orals-stop date 10/16 Remdesivir 3 days completed on 10/10 Continues to improve without tachycardia or hypoxia--no CT needed at this time, despite elevated dimer   Likely aspiration pneumonia CXR right side 10/8 suggestive pneumonia Completed abx 10/13 SLP graduated to Dys -2--tolerating fair   Fall-no  fractures on imaging Therapy is seen they recommend skilled--- she is minimally ambulatory but was able to get to the chair with some assistance   Toxic encephalopathy on admission probably from aspiration Palliative following DNR confirmed Her mental status has improved   No acute kidney injury CKD 2 Stable at this time-periodic labs   Chronic indwelling Foley and Asymptomatic bacteriuria-no treatment at this time   Hypomagnesemia-resolved with replacement   Adult failure to thrive, protein energy malnutrition with BMI of 16 Expected given age   Discharge Exam: Vitals:   01/22/22 0638 01/22/22 0857  BP: (!) 170/48 (!) 121/49  Pulse: (!) 57 72  Resp: 18 15  Temp: (!) 97.5 F (36.4 C) 98.5 F (36.9 C)  SpO2: 96% 97%    Subj on day of d/c   Awake coherent no distress quite hard of hearing, looks well sitting in chair no fevers no chills Daughter is at the bedside  General Exam on discharge  Frail elderly white female no distress S1-S2 no murmur no rub no gallop ROM intact no focal deficit Abdomen is soft no rebound no guarding No lower extremity edema  Discharge Instructions    Allergies as of 01/22/2022       Reactions   Ace Inhibitors Other (See Comments)   Severe hyponatremia   Aldactone [spironolactone] Other (See Comments)   Severe hyponatremia   Angiotensin Receptor Blockers Other (See Comments)   Severe hyponatremia   Sulfa Antibiotics Rash        Medication List     STOP taking these medications    carvedilol 3.125 MG tablet Commonly known as: COREG   hydrALAZINE 50 MG tablet Commonly known as: APRESOLINE   levETIRAcetam 500 MG tablet Commonly known as: KEPPRA  TAKE these medications    acetaminophen 500 MG tablet Commonly known as: TYLENOL Take 500 mg by mouth daily as needed (pain).   cholecalciferol 25 MCG (1000 UNIT) tablet Commonly known as: VITAMIN D3 Take 1,000 Units by mouth daily with lunch.   dexamethasone  6 MG tablet Commonly known as: DECADRON Take 1 tablet (6 mg total) by mouth daily. Start taking on: January 23, 2022   guaiFENesin-dextromethorphan 100-10 MG/5ML syrup Commonly known as: ROBITUSSIN DM Take 10 mLs by mouth every 4 (four) hours as needed for cough.   levothyroxine 112 MCG tablet Commonly known as: SYNTHROID Take 1 tablet (112 mcg total) by mouth daily at 6 (six) AM. What changed: when to take this   pregabalin 50 MG capsule Commonly known as: LYRICA Take 1 capsule (50 mg total) by mouth 2 (two) times daily.       Allergies  Allergen Reactions   Ace Inhibitors Other (See Comments)    Severe hyponatremia   Aldactone [Spironolactone] Other (See Comments)    Severe hyponatremia   Angiotensin Receptor Blockers Other (See Comments)    Severe hyponatremia   Sulfa Antibiotics Rash      The results of significant diagnostics from this hospitalization (including imaging, microbiology, ancillary and laboratory) are listed below for reference.    Significant Diagnostic Studies: DG Swallowing Func-Speech Pathology  Result Date: 01/17/2022 Table formatting from the original result was not included. Images from the original result were not included. Objective Swallowing Evaluation: Type of Study: MBS-Modified Barium Swallow Study  Patient Details Name: Bethany Blake MRN: [id number] Date of Birth: Dec 15, 1921 Today's Date: 01/17/2022 Time: SLP Start Time (ACUTE ONLY): 1527 -SLP Stop Time (ACUTE ONLY): 1543 SLP Time Calculation (min) (ACUTE ONLY): 16 min Past Medical History: Past Medical History: Diagnosis Date  Anxiety   Arthritis   Chronic kidney disease   Hypertension   Hypokalemia   Hyponatremia   Hypothyroidism  Past Surgical History: Past Surgical History: Procedure Laterality Date  SHOULDER SURGERY    WRIST SURGERY   HPI: Pt is a 88 y.o. female who was sent from facility for evaluation of worsening of cough and mentation changes. CXR negative. Dx COVID-19, acute metaboloic  encephalopathy. PMH: dementia, HTN, CKD stage II, chronic hyponatremia, hypothyroidism.  No data recorded  Recommendations for follow up therapy are one component of a multi-disciplinary discharge planning process, led by the attending physician.  Recommendations may be updated based on patient status, additional functional criteria and insurance authorization. Assessment / Plan / Recommendation   01/17/2022   3:11 PM Clinical Impressions Clinical Impression Pt presents with oropharyngeal dysphagia characterized by impaired posterior propulsion, a pharyngeal delay and reduction in bolus cohesion, tongue base retraction, and pharyngeal constriction. She demonstrated lingual pumping, premature spillage to the valleculae, vallecular residue and posterior pharyngeal wall residue. Residue was improved with a liquid wash. Penetration (PAS 2; considered WNL) was noted with thin liquids via cup and this progressed to PAS 3 with consecutive swallows of thin liquids when straws were used. A single instance of trace aspiration (PAS 7) was noted after penetration (PAS 3) of thin liquids via straw. This triggered subtle coughing which was ineffective, but aspirated material was expelled with prompts for pt to cough more. Transport of the barium tablet was halted at the level of the valleculae and upper esophagus, but movement was facilitated with additional boluses of thin liquids. A dysphagia 2 diet with thin liquids is recommended at this time with strict observance of swallowing precautions. SLP  continue to follow pt. SLP Visit Diagnosis Dysphagia, oropharyngeal phase (R13.12) Impact on safety and function Mild aspiration risk     01/17/2022   3:11 PM Treatment Recommendations Treatment Recommendations Therapy as outlined in treatment plan below     01/17/2022   3:11 PM Prognosis Prognosis for Safe Diet Advancement Good Barriers to Reach Goals Cognitive deficits   01/17/2022   3:11 PM Diet Recommendations SLP Diet  Recommendations Dysphagia 2 (Fine chop) solids;Thin liquid Liquid Administration via Cup;No straw Medication Administration Whole meds with puree Compensations Minimize environmental distractions;Small sips/bites;Follow solids with liquid Postural Changes Seated upright at 90 degrees     01/17/2022   3:11 PM Other Recommendations Oral Care Recommendations Oral care BID Follow Up Recommendations Skilled nursing-short term rehab (<3 hours/day) Assistance recommended at discharge Frequent or constant Supervision/Assistance Functional Status Assessment Patient has had a recent decline in their functional status and demonstrates the ability to make significant improvements in function in a reasonable and predictable amount of time.   01/17/2022   3:11 PM Frequency and Duration  Speech Therapy Frequency (ACUTE ONLY) min 2x/week Treatment Duration 2 weeks     01/17/2022   3:11 PM Oral Phase Oral Phase Impaired Oral - Nectar Straw Decreased bolus cohesion;Premature spillage;Reduced posterior propulsion Oral - Thin Cup Decreased bolus cohesion;Premature spillage;Reduced posterior propulsion Oral - Thin Straw Decreased bolus cohesion;Premature spillage;Reduced posterior propulsion Oral - Puree Delayed oral transit;Decreased bolus cohesion;Premature spillage;Reduced posterior propulsion;Lingual pumping Oral - Mech Soft Delayed oral transit;Decreased bolus cohesion;Premature spillage;Reduced posterior propulsion;Lingual pumping;Impaired mastication Oral - Pill Delayed oral transit;Decreased bolus cohesion;Premature spillage;Reduced posterior propulsion;Lingual pumping    01/17/2022   3:11 PM Pharyngeal Phase Pharyngeal Phase Impaired Pharyngeal- Nectar Straw Reduced tongue base retraction;Reduced pharyngeal peristalsis;Delayed swallow initiation-vallecula;Pharyngeal residue - valleculae;Pharyngeal residue - posterior pharnyx Pharyngeal- Thin Cup Reduced tongue base retraction;Reduced pharyngeal peristalsis;Delayed swallow  initiation-vallecula;Pharyngeal residue - valleculae;Pharyngeal residue - posterior pharnyx;Penetration/Aspiration during swallow Pharyngeal Material enters airway, remains ABOVE vocal cords then ejected out Pharyngeal- Thin Straw Reduced tongue base retraction;Reduced pharyngeal peristalsis;Delayed swallow initiation-vallecula;Pharyngeal residue - valleculae;Pharyngeal residue - posterior pharnyx Pharyngeal Material enters airway, remains ABOVE vocal cords and not ejected out Pharyngeal- Puree Reduced tongue base retraction;Reduced pharyngeal peristalsis;Delayed swallow initiation-vallecula;Pharyngeal residue - valleculae;Pharyngeal residue - posterior pharnyx Pharyngeal- Mechanical Soft Reduced tongue base retraction;Reduced pharyngeal peristalsis;Delayed swallow initiation-vallecula;Pharyngeal residue - valleculae;Pharyngeal residue - posterior pharnyx Pharyngeal- Pill Reduced tongue base retraction;Reduced pharyngeal peristalsis;Delayed swallow initiation-vallecula;Pharyngeal residue - valleculae;Pharyngeal residue - posterior pharnyx     No data to display    Shanika I. Hardin Negus, [place], [place] Office number [phone] Horton Marshall 01/17/2022, 4:05 PM                     DG Chest 1 View  Result Date: 01/15/2022 CLINICAL DATA:  [id number]. Pneumonia follow-up. Question pulmonary edema or pleural effusion. EXAM: CHEST  1 VIEW COMPARISON:  AP Lat chest yesterday at 11:05 a.m. FINDINGS: 4:51 a.m. There is increased right infrahilar streaky opacity concerning for pneumonia or aspiration with increased small right pleural effusion. Remaining lungs remain clear with COPD change. The heart moderately enlarged with normal caliber central vessels. The aorta is tortuous and calcified with stable mediastinum. Osteopenia and advanced chronic rotator cuff arthropathy are chronically noted, with chronic superior subluxation and medial proximal shaft remodeling of the right humerus.  IMPRESSION: Worsening right infrahilar opacity worrisome for pneumonia or aspiration. Increased small right pleural effusion. Electronically Signed   By: Telford Nab M.D.   On: 01/15/2022  06:18   DG Elbow 2 Views Left  Result Date: 01/14/2022 CLINICAL DATA:  Elbow abnormality on humeral radiographs EXAM: LEFT ELBOW - 2 VIEW COMPARISON:  LEFT humeral radiographs 01/14/2022 FINDINGS: Osseous demineralization. Tiny olecranon spur. 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 Associated joint effusion. No acute fracture or dislocation identified Degenerative changes at elbow joint. IMPRESSION: Degenerative changes LEFT elbow joint. 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 Electronically Signed   By: Lavonia Dana M.D.   On: 01/14/2022 12:52   DG Hip Unilat W or Wo Pelvis 2-3 Views Left  Result Date: 01/14/2022 CLINICAL DATA:  88 year old female with fall out of bed. Cough and congestion. EXAM: DG HIP (WITH OR WITHOUT PELVIS) 2-3V LEFT COMPARISON:  Pelvis radiographs 11/06/2018. FINDINGS: Bone mineralization is within normal limits for age. Femoral heads are normally located. Evidence of chronic pubic rami fractures. No definite acute pelvis fracture or dislocation. Grossly intact proximal right femur. Proximal left femur appears intact. Nonobstructed visible bowel gas pattern with retained stool in the rectum. Aortoiliac calcified atherosclerosis. IMPRESSION: No acute fracture or dislocation identified about the left hip or pelvis. If occult hip fracture is suspected or if the patient is unable to weightbear, MRI is the preferred modality for further evaluation. Electronically Signed   By: Genevie Ann M.D.   On: 01/14/2022 11:27   DG Humerus Left  Result Date: 01/14/2022 CLINICAL DATA:  88 year old female with fall out of bed. Cough and congestion. EXAM: LEFT HUMERUS - 2+ VIEW COMPARISON:  Left elbow series 01/03/2011. FINDINGS: Osteopenia.  Left humerus appears intact, but there is evidence of chronic bone resorption of the proximal radius which was fractured in 2012. Grossly maintained other alignment at the left elbow and shoulder. Negative visible left ribs and chest. IMPRESSION: 1. Osteopenia. No definite acute fracture or dislocation identified about the left humerus. 2. Suspect chronic resorption of bone from the proximal left radius since 2012. Electronically Signed   By: Genevie Ann M.D.   On: 01/14/2022 11:26   DG Chest 2 View  Result Date: 01/14/2022 CLINICAL DATA:  88 year old female with fall out of bed. Cough and congestion. EXAM: CHEST - 2 VIEW COMPARISON:  Portable chest 01/23/2020 and earlier. FINDINGS: Supine AP and lateral views of the chest at 1111 hours. Cardiomegaly. Tortuous and calcified thoracic aorta. Mediastinal contours appear stable since 2021. Visualized tracheal air column is within normal limits. No pneumothorax, pulmonary edema, pleural effusion, or consolidation identified. Osteopenia and chronic kyphoscoliosis. Chronic deformity proximal right humerus appears stable. No acute osseous abnormality identified. Negative visible bowel gas. IMPRESSION: 1. No acute cardiopulmonary abnormality. 2. Cardiomegaly and Aortic Atherosclerosis (ICD10-I70.0). 3. Osteopenia and spinal kyphoscoliosis. Electronically Signed   By: Genevie Ann M.D.   On: 01/14/2022 11:25   CT HEAD WO CONTRAST  Result Date: 01/14/2022 CLINICAL DATA:  Un witnessed fall this morning. EXAM: CT HEAD WITHOUT CONTRAST CT CERVICAL SPINE WITHOUT CONTRAST TECHNIQUE: Multidetector CT imaging of the head and cervical spine was performed following the standard protocol without intravenous contrast. Multiplanar CT image reconstructions of the cervical spine were also generated. RADIATION DOSE REDUCTION: This exam was performed according to the departmental dose-optimization program which includes automated exposure control, adjustment of the mA and/or kV according to  patient size and/or use of iterative reconstruction technique. COMPARISON:  06/19/2021. FINDINGS: CT HEAD FINDINGS Brain: No evidence of acute infarction, hemorrhage, hydrocephalus, extra-axial collection or mass lesion/mass effect. 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 No hypervascular skull base vessel. Skull: Normal. Negative for fracture or focal lesion. Sinuses/Orbits: Globes and orbits are unremarkable. Sinuses essentially clear. Other: None. CT CERVICAL SPINE FINDINGS Alignment: Kyphosis, apex at C4. Slight anterolisthesis of C2 on C3 and C3 on C4, degenerative. These findings are stable. Skull base and vertebrae: No acute fracture. No primary bone lesion or focal pathologic process. Soft tissues and spinal canal: No prevertebral fluid or swelling. No visible canal hematoma. Disc levels: Mild loss of disc height at C2-C3 and C3-C4. Marked loss of disc height at C4-C5 and C5-C6. Moderate loss of disc height at C6-C7. Bilateral facet degenerative changes. No convincing disc herniation. Upper chest: No acute findings. Other: None. IMPRESSION: HEAD CT 1. No acute intracranial abnormalities. CERVICAL CT 1. No fracture or acute finding. Electronically Signed   By: Lajean Manes M.D.   On: 01/14/2022 11:10   CT CERVICAL SPINE WO CONTRAST  Result Date: 01/14/2022 CLINICAL DATA:  Un witnessed fall this morning. EXAM: CT HEAD WITHOUT CONTRAST CT CERVICAL SPINE WITHOUT CONTRAST TECHNIQUE: Multidetector CT imaging of the head and cervical spine was performed following the standard protocol without intravenous contrast. Multiplanar CT image reconstructions of the cervical spine were also generated. RADIATION DOSE REDUCTION: This exam was performed according to the departmental dose-optimization program which includes automated exposure control, adjustment of the mA and/or kV according to patient size and/or use of iterative reconstruction technique. COMPARISON:  06/19/2021. FINDINGS:  CT HEAD FINDINGS Brain: No evidence of acute infarction, hemorrhage, hydrocephalus, extra-axial collection or mass lesion/mass effect. 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 No hypervascular skull base vessel. Skull: Normal. Negative for fracture or focal lesion. Sinuses/Orbits: Globes and orbits are unremarkable. Sinuses essentially clear. Other: None. CT CERVICAL SPINE FINDINGS Alignment: Kyphosis, apex at C4. Slight anterolisthesis of C2 on C3 and C3 on C4, degenerative. These findings are stable. Skull base and vertebrae: No acute fracture. No primary bone lesion or focal pathologic process. Soft tissues and spinal canal: No prevertebral fluid or swelling. No visible canal hematoma. Disc levels: Mild loss of disc height at C2-C3 and C3-C4. Marked loss of disc height at C4-C5 and C5-C6. Moderate loss of disc height at C6-C7. Bilateral facet degenerative changes. No convincing disc herniation. Upper chest: No acute findings. Other: None. IMPRESSION: HEAD CT 1. No acute intracranial abnormalities. CERVICAL CT 1. No fracture or acute finding. Electronically Signed   By: Lajean Manes M.D.   On: 01/14/2022 11:10    Microbiology: Recent Results (from the past 240 hour(s))  Resp Panel by RT-PCR (Flu A&B, Covid) Anterior Nasal Swab     Status: Abnormal   Collection Time: 01/14/22 10:34 AM   Specimen: Anterior Nasal Swab  Result Value Ref Range Status   SARS Coronavirus 2 by RT PCR POSITIVE (A) NEGATIVE Final    Comment: (NOTE) SARS-CoV-2 target nucleic acids are DETECTED.  The SARS-CoV-2 RNA is generally detectable in upper respiratory specimens during the acute phase of infection. Positive results are indicative of the presence of the identified virus, but do not rule out bacterial infection or co-infection with other pathogens not detected by the test. Clinical correlation with patient history and other diagnostic information is necessary to determine  patient infection status. The expected result is Negative.  Fact Sheet for Patients: EntrepreneurPulse.com.au  Fact Sheet for Healthcare Providers: IncredibleEmployment.be  This test is not yet approved or cleared by the [place] FDA and  has been authorized for detection and/or diagnosis of  SARS-CoV-2 by FDA under an Emergency Use Authorization (EUA).  This EUA will remain in effect (meaning this test can be used) for the duration of  the COVID-19 declaration under Section 564(b)(1) of the A ct, 21 U.S.C. section 360bbb-3(b)(1), unless the authorization is terminated or revoked sooner.     Influenza A by PCR NEGATIVE NEGATIVE Final   Influenza B by PCR NEGATIVE NEGATIVE Final    Comment: (NOTE) The Xpert Xpress SARS-CoV-2/FLU/RSV plus assay is intended as an aid in the diagnosis of influenza from Nasopharyngeal swab specimens and should not be used as a sole basis for treatment. Nasal washings and aspirates are unacceptable for Xpert Xpress SARS-CoV-2/FLU/RSV testing.  Fact Sheet for Patients: EntrepreneurPulse.com.au  Fact Sheet for Healthcare Providers: IncredibleEmployment.be  This test is not yet approved or cleared by the [place] FDA and has been authorized for detection and/or diagnosis of SARS-CoV-2 by FDA under an Emergency Use Authorization (EUA). This EUA will remain in effect (meaning this test can be used) for the duration of the COVID-19 declaration under Section 564(b)(1) of the Act, 21 U.S.C. section 360bbb-3(b)(1), unless the authorization is terminated or revoked.  Performed at [facility], [place] [address] [place], [place] [address]   Urine Culture     Status: None   Collection Time: 01/15/22  1:00 PM   Specimen: Urine, Clean Catch  Result Value Ref Range Status   Specimen Description URINE, CLEAN CATCH  Final   Special Requests NONE  Final   Culture   Final     NO GROWTH Performed at [facility], [place] [address] [place], [place] [address]    Report Status 01/17/2022 FINAL  Final     Labs: Basic Metabolic Panel: Recent Labs  Lab 01/16/22 0326 01/17/22 0400 01/18/22 0808 01/19/22 0352  NA 136 135 137 137  K 4.4 4.5 4.3 4.5  CL 103 105 106 103  CO2 22 21* 24 23  GLUCOSE 129* 129* 96 117*  BUN 17 20 20 23   CREATININE 0.78 0.70 0.69 0.85  CALCIUM 8.5* 8.4* 8.2* 8.8*  MG 1.6* 2.2 1.8 1.8  PHOS 2.8 2.5 2.6 2.9   Liver Function Tests: Recent Labs  Lab 01/16/22 0326 01/17/22 0400 01/18/22 0808 01/19/22 0352  AST 13* 14* 19 21  ALT 11 11 14 14   ALKPHOS 38 34* 37* 37*  BILITOT 0.3 0.5 0.3 0.6  PROT 5.0* 4.7* 4.8* 4.4*  ALBUMIN 2.3* 2.2* 2.4* 2.2*   No results for input(s): "LIPASE", "AMYLASE" in the last 168 hours. No results for input(s): "AMMONIA" in the last 168 hours. CBC: Recent Labs  Lab 01/16/22 0326 01/17/22 0400 01/18/22 0808 01/19/22 0352  WBC 4.3 5.6 5.3 5.1  NEUTROABS 3.5 4.5 3.6 3.7  HGB 8.7* 8.8* 8.9* 8.2*  HCT 28.1* 26.6* 27.8* 24.7*  MCV 81.2 79.4* 80.8 79.4*  PLT 389 394 446* 393   Cardiac Enzymes: No results for input(s): "CKTOTAL", "CKMB", "CKMBINDEX", "TROPONINI" in the last 168 hours. BNP: BNP (last 3 results) No results for input(s): "BNP" in the last 8760 hours.  ProBNP (last 3 results) No results for input(s): "PROBNP" in the last 8760 hours.  CBG: Recent Labs  Lab 01/21/22 0724 01/21/22 1123 01/21/22 1623 01/22/22 0731  GLUCAP 81 97 139* 75       Signed:  Nita Sells MD   [facility] 01/22/2022, 10:20 AM

## 2022-01-22 NOTE — Progress Notes (Addendum)
Patient is stable for discharge does not require inpatient hospital stay-I discussed with daughter-hopeful for skilled placement on 10/16 and we will update today's discharge summary tomorrow once I reassess her in the morning

## 2022-01-23 LAB — BASIC METABOLIC PANEL
Anion gap: 7 (ref 5–15)
BUN: 31 mg/dL — ABNORMAL HIGH (ref 8–23)
CO2: 27 mmol/L (ref 22–32)
Calcium: 8.4 mg/dL — ABNORMAL LOW (ref 8.9–10.3)
Chloride: 100 mmol/L (ref 98–111)
Creatinine, Ser: 0.84 mg/dL (ref 0.44–1.00)
GFR, Estimated: >60 mL/min (ref >60)
Glucose, Bld: 100 mg/dL — ABNORMAL HIGH (ref 70–99)
Potassium: 5 mmol/L (ref 3.5–5.1)
Sodium: 134 mmol/L — ABNORMAL LOW (ref 135–145)

## 2022-01-23 NOTE — TOC Progression Note (Signed)
Transition of Care [facility]) - Initial/Assessment Note    Patient Details  Name: Bethany Blake MRN: [id number] Date of Birth: 10-Dec-1921  Transition of Care [facility]) CM/SW Contact:    Milinda Antis, [place] Phone Number: 01/23/2022, 10:26 AM  Clinical Narrative:                 LCSW spoke with Abigail Butts and [facility] and was informed that the patient should be able to discharge to the facility tomorrow.  LCSW was informed that the admissions director, Narda Rutherford, will be back tomorrow and to follow up with her.    CSW requested the [facility] request insurance authorization.          Patient Goals and CMS Choice        Expected Discharge Plan and Services                                                Prior Living Arrangements/Services                       Activities of Daily Living   ADL Screening (condition at time of admission) Patient's cognitive ability adequate to safely complete daily activities?: No Is the patient deaf or have difficulty hearing?: Yes Does the patient have difficulty seeing, even when wearing glasses/contacts?: No Does the patient have difficulty concentrating, remembering, or making decisions?: Yes Patient able to express need for assistance with ADLs?: No Does the patient have difficulty dressing or bathing?: Yes Independently performs ADLs?: No Communication: Independent Dressing (OT): Needs assistance Is this a change from baseline?: Pre-admission baseline Grooming: Needs assistance Is this a change from baseline?: Pre-admission baseline Feeding: Needs assistance Is this a change from baseline?: Pre-admission baseline Bathing: Needs assistance Is this a change from baseline?: Pre-admission baseline Toileting: Needs assistance Is this a change from baseline?: Pre-admission baseline In/Out Bed: Needs assistance Is this a change from baseline?: Pre-admission baseline Walks in Home: Independent with device (comment), Needs  assistance Is this a change from baseline?: Pre-admission baseline Does the patient have difficulty walking or climbing stairs?: Yes Weakness of Legs: Both Weakness of Arms/Hands: Both  Permission Sought/Granted                  Emotional Assessment              Admission diagnosis:  Fall, initial encounter [W19.XXXA] Pneumonia of right lung due to infectious organism, unspecified part of lung [J18.9] COVID [U07.1] COVID-19 [U07.1] Patient Active Problem List   Diagnosis Date Noted   Aspiration pneumonia ([place]) 01/15/2022   Pneumonia due to COVID-19 virus 01/14/2022   COVID 01/14/2022   Syncope 06/19/2021   Normocytic anemia 06/19/2021   Stage 3a chronic kidney disease (CKD) ([place]) 06/19/2021   Subdural hematoma ([place]) 06/19/2021   Ulcer of heel and midfoot ([place]) 02/27/2020   E-coli UTI    Acute encephalopathy 01/23/2020   Urinary tract infection without hematuria    Bilateral subdural hematomas ([place]) 12/31/2019   Subarachnoid hemorrhage ([place]) 12/31/2019   Hypothyroidism 12/31/2019   Fall at home, initial encounter 12/31/2019   Aneurysm of right internal carotid artery 12/31/2019   Physical deconditioning 09/02/2014   SIADH (syndrome of inappropriate ADH production) ([place]) 08/28/2014   Essential hypertension 08/28/2014   Confusion 08/28/2014   AKI (acute kidney injury) ([place]) 08/28/2014   Hypochloremia  08/28/2014   PCP:  [name], MD Pharmacy:   [place], [place] - [address] [place] [place] [address] Phone: [phone] Fax: [phone]     Social Determinants of Health (SDOH) Interventions    Readmission Risk Interventions     No data to display

## 2022-01-23 NOTE — Plan of Care (Signed)
  Problem: Respiratory: Goal: Will maintain a patent airway Outcome: Progressing Goal: Complications related to the disease process, condition or treatment will be avoided or minimized Outcome: Progressing   Problem: Clinical Measurements: Goal: Respiratory complications will improve Outcome: Progressing   Problem: Nutrition: Goal: Adequate nutrition will be maintained Outcome: Progressing   Problem: Elimination: Goal: Will not experience complications related to urinary retention Outcome: Progressing

## 2022-01-23 NOTE — Progress Notes (Signed)
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  Right-sided pneumonia hip x-ray no fracture x-ray arm no radial fracture COVID-positive Admitted with COVID-placed on Decadron, remdesivir  Hospital-Problem based course  COVID-19 IV Decadron given initially ---on orals-stop date 10/16 Remdesivir 3 days completed on 10/10 stable  Likely aspiration pneumonia CXR right side 10/8 suggestive pneumonia Completed abx 10/13 SLP graduated to Dys -2--tolerating fair  Fall-no fractures on imaging Therapy recs SNF  Toxic encephalopathy on admission probably from aspiration Palliative following DNR confirmed Her mental status has improved  No acute kidney injury CKD 2 Stable at this time-periodic labs  Asymptomatic bacteriuria-no treatment at this time  Hypomagnesemia-resolved with replacement  Adult failure to thrive, protein energy malnutrition with BMI of 16 Expected given age   DVT prophylaxis: Lovenox Code Status: DNR Family Communication: discussed with daughter Laurie--[phone] on 10/15 Disposition:  Status is: Inpatient Remains inpatient appropriate because:  Patient requires isolation until skilled facility can accept-otherwise seems to have stabilized for discharge and this is an unavoidable barrier   Consultants:  None  Procedures: No  Antimicrobials: Current  Augmentin   Subjective:  No distress sitting up some  No fever chills  Objective: Vitals:   01/22/22 0857 01/22/22 1718 01/22/22 2216 01/23/22 0623  BP: (!) 121/49 (!) 106/55 (!) 115/59 (!) 116/54  Pulse: 72 77 67 (!) 57  Resp: 15 15 16 18   Temp: 98.5 F (36.9 C) 97.6 F (36.4 C) 99.5 F (37.5 C) 98.6 F (37 C)  TempSrc: Oral Oral Oral Oral  SpO2: 97% 96% 96% 95%  Weight:      Height:        Intake/Output Summary (Last 24 hours) at 01/23/2022 1416 Last data filed at 01/23/2022 1000 Gross per 24 hour  Intake 460 ml  Output --  Net 460 ml    Filed Weights   01/14/22 1809 01/19/22 0617  Weight: 42.7 kg 47.6 kg    Examination:  EOMI NCAT no focal deficit Chest clear no wheeze Abdomen is soft-mild distension    Data Reviewed: personally reviewed   CBC    Component Value Date/Time   WBC 5.1 01/19/2022 0352   RBC 3.11 (L) 01/19/2022 0352   HGB 8.2 (L) 01/19/2022 0352   HCT 24.7 (L) 01/19/2022 0352   PLT 393 01/19/2022 0352   MCV 79.4 (L) 01/19/2022 0352   MCH 26.4 01/19/2022 0352   MCHC 33.2 01/19/2022 0352   RDW 17.5 (H) 01/19/2022 0352   LYMPHSABS 1.0 01/19/2022 0352   MONOABS 0.3 01/19/2022 0352   EOSABS 0.0 01/19/2022 0352   BASOSABS 0.0 01/19/2022 0352      Latest Ref Rng & Units 01/23/2022    3:40 AM 01/19/2022    3:52 AM 01/18/2022    8:08 AM  CMP  Glucose 70 - 99 mg/dL 100  117  96   BUN 8 - 23 mg/dL 31  23  20    Creatinine 0.44 - 1.00 mg/dL 0.84  0.85  0.69   Sodium 135 - 145 mmol/L 134  137  137   Potassium 3.5 - 5.1 mmol/L 5.0  4.5  4.3   Chloride 98 - 111 mmol/L 100  103  106   CO2 22 - 32 mmol/L 27  23  24    Calcium 8.9 - 10.3 mg/dL 8.4  8.8  8.2   Total Protein 6.5 - 8.1 g/dL  4.4  4.8   Total Bilirubin 0.3 - 1.2 mg/dL  0.6  0.3   Alkaline Phos 38 - 126 U/L  37  37   AST 15 - 41 U/L  21  19   ALT 0 - 44 U/L  14  14      Radiology Studies: No results found.   Scheduled Meds:  enoxaparin (LOVENOX) injection  30 mg  Subcutaneous Q24H   feeding supplement  237 mL Oral BID BM   levothyroxine  112 mcg Oral Q0600   multivitamin with minerals  1 tablet Oral Daily   pregabalin  50 mg Oral BID   Continuous Infusions:   LOS: 9 days   Time spent: 14  , MD [facility] To contact the attending provider between 7A-7P or the covering provider during after hours 7P-7A, please log into the web site [email or web address] and access using universal [place] password for that web site. If you do not have the password, please call the hospital operator.  01/23/2022, 2:16 PM

## 2022-01-23 NOTE — Progress Notes (Signed)
Physical Therapy Treatment Patient Details Name: Bethany Blake MRN: [id number] DOB: 1921-06-20 Today's Date: 01/23/2022   History of Present Illness pt is a 88 y/o female admitted 10/7 for worsening cough and mentation changes.  Daughter reports recent loose bowels/diarrhea plus recent visits from family members found to have COVID, and episodic confusion with general weakness. +COVID  PMHx, CKD, HTN, arthritis    PT Comments    Patient agreeable to LE exercise program, however when attempted to persuade her to mobilize to EOB or OOB she refused. Able to assist pt with scooting up toward [facility] and then placed in chair position in bed for improved pulmonary function and BP challenge. Patient remains confused. Can benefit from continued therapy as she was ambulatory on evaluation. Will continue efforts.     Recommendations for follow up therapy are one component of a multi-disciplinary discharge planning process, led by the attending physician.  Recommendations may be updated based on patient status, additional functional criteria and insurance authorization.  Follow Up Recommendations  Skilled nursing-short term rehab (<3 hours/day) Can patient physically be transported by private vehicle: No   Assistance Recommended at Discharge Intermittent Supervision/Assistance  Patient can return home with the following A little help with walking and/or transfers;A little help with bathing/dressing/bathroom;Assistance with cooking/housework;Assist for transportation;Help with stairs or ramp for entrance;Direct supervision/assist for medications management;Direct supervision/assist for financial management   Equipment Recommendations  None recommended by PT    Recommendations for Other Services       Precautions / Restrictions Precautions Precautions: Fall Restrictions Weight Bearing Restrictions: No     Mobility  Bed Mobility Overal bed mobility: Needs Assistance             General bed  mobility comments: Patient assisted to scoot up toward [facility] with mod assist and trendelenburg. Placed in chair position for pulmonary hygiene and BP challenge (pt refused to sit on EOB--"we'll do that tomorrow")    Transfers                        Ambulation/Gait                   Stairs             Wheelchair Mobility    Modified Rankin (Stroke Patients Only)       Balance                                            Cognition Arousal/Alertness: Awake/alert Behavior During Therapy: Flat affect Overall Cognitive Status: No family/caregiver present to determine baseline cognitive functioning                                 General Comments: Pt was not oriented to place, time, or situation.        Exercises Low Level/ICU Exercises Ankle Circles/Pumps: AROM, Both, 10 reps, Supine Short Arc Quad: AAROM, Both, 10 reps Hip ABduction/ADduction: AAROM, Both, 10 reps Heel Slides: AAROM, Strengthening, Both, 10 reps (assisted flexion, resisted extension) Other Exercises Other Exercises: pt agreeable to exercises in supine    General Comments        Pertinent Vitals/Pain Pain Assessment Pain Assessment: No/denies pain    Home Living  Prior Function            PT Goals (current goals can now be found in the care plan section) Acute Rehab PT Goals Patient Stated Goal: wanted to get warm Time For Goal Achievement: 01/29/22 Potential to Achieve Goals: Good Progress towards PT goals: Not progressing toward goals - comment    Frequency    Min 2X/week      PT Plan Current plan remains appropriate;Frequency needs to be updated    Co-evaluation              AM-PAC PT "6 Clicks" Mobility   Outcome Measure  Help needed turning from your back to your side while in a flat bed without using bedrails?: A Lot Help needed moving from lying on your back to sitting on the side  of a flat bed without using bedrails?: A Lot Help needed moving to and from a bed to a chair (including a wheelchair)?: A Lot Help needed standing up from a chair using your arms (e.g., wheelchair or bedside chair)?: A Lot Help needed to walk in hospital room?: Total Help needed climbing 3-5 steps with a railing? : Total 6 Click Score: 10    End of Session   Activity Tolerance: Patient tolerated treatment well Patient left: with call bell/phone within reach;in bed;with bed alarm set Nurse Communication: Other (comment) (refusing OOB or EOB) PT Visit Diagnosis: Muscle weakness (generalized) (M62.81)     Time: 6270-3500 PT Time Calculation (min) (ACUTE ONLY): 18 min  Charges:  $Therapeutic Exercise: 8-22 mins                      Jerolyn Center, PT Acute Rehabilitation Services  Office [phone]    Zena Amos 01/23/2022, 2:54 PM

## 2022-01-24 NOTE — Progress Notes (Signed)
Occupational Therapy Treatment Patient Details Name: Bethany Blake MRN: [id number] DOB: 09/10/1921 Today's Date: 01/24/2022   History of present illness pt is a 88 y/o female admitted 10/7 for worsening cough and mentation changes.  Daughter reports recent loose bowels/diarrhea plus recent visits from family members found to have COVID, and episodic confusion with general weakness. +COVID  PMHx, CKD, HTN, arthritis   OT comments  Pt is progressing well toward her OT goals with significant improvement in her ADL transfers this session. She used a RW to complete 15 ft of functional mobility around the room with min A overall, great improvement in initiation of sit > stand. Mod cueing for RW management and safety cueing. Continue to recommend SNF at d/c to maximize return to PLOF.    Recommendations for follow up therapy are one component of a multi-disciplinary discharge planning process, led by the attending physician.  Recommendations may be updated based on patient status, additional functional criteria and insurance authorization.    Follow Up Recommendations  Skilled nursing-short term rehab (<3 hours/day)    Assistance Recommended at Discharge Frequent or constant Supervision/Assistance  Patient can return home with the following  A lot of help with bathing/dressing/bathroom;Assistance with cooking/housework;Assist for transportation;Direct supervision/assist for medications management;Direct supervision/assist for financial management;A lot of help with walking and/or transfers   Equipment Recommendations  None recommended by OT    Recommendations for Other Services      Precautions / Restrictions Precautions Precautions: Fall Restrictions Weight Bearing Restrictions: No       Mobility Bed Mobility Overal bed mobility: Needs Assistance Bed Mobility: Supine to Sit     Supine to sit: Mod assist Sit to supine: Mod assist   General bed mobility comments: Pt required use of  the bedrail and HOB elevated, as well as external support    Transfers Overall transfer level: Needs assistance Equipment used: Rolling walker (2 wheels) Transfers: Sit to/from Stand, Bed to chair/wheelchair/BSC Sit to Stand: Min assist     Step pivot transfers: Min assist     General transfer comment: Increased posterior lean initiially in standing with flexed knees.     Balance Overall balance assessment: Needs assistance Sitting-balance support: Feet supported, No upper extremity supported Sitting balance-Leahy Scale: Fair Sitting balance - Comments: once feet supported on floor with shoes on   Standing balance support: Reliant on assistive device for balance Standing balance-Leahy Scale: Poor Standing balance comment: Pt needs support from the RW as well as therapist assist secondary to posterior lean                           ADL either performed or assessed with clinical judgement   ADL                                              Extremity/Trunk Assessment Upper Extremity Assessment Upper Extremity Assessment: Generalized weakness   Lower Extremity Assessment Lower Extremity Assessment: Defer to PT evaluation         Cognition Arousal/Alertness: Awake/alert Behavior During Therapy: Flat affect Overall Cognitive Status: History of cognitive impairments - at baseline                                 General Comments: Pt was able to follow  all directions with increased cueing              General Comments Daughter present and supportive    Pertinent Vitals/ Pain       Pain Assessment Pain Assessment: No/denies pain   Frequency  Min 2X/week        Progress Toward Goals  OT Goals(current goals can now be found in the care plan section)  Progress towards OT goals: Progressing toward goals  Acute Rehab OT Goals Patient Stated Goal: None stated OT Goal Formulation: With patient Time For Goal  Achievement: 01/30/22 Potential to Achieve Goals: Good ADL Goals Pt Will Transfer to Toilet: with supervision Pt Will Perform Toileting - Clothing Manipulation and hygiene: with supervision  Plan Discharge plan remains appropriate    Co-evaluation                 AM-PAC OT "6 Clicks" Daily Activity     Outcome Measure   Help from another person eating meals?: None Help from another person taking care of personal grooming?: A Little Help from another person toileting, which includes using toliet, bedpan, or urinal?: A Lot Help from another person bathing (including washing, rinsing, drying)?: A Lot Help from another person to put on and taking off regular upper body clothing?: A Lot Help from another person to put on and taking off regular lower body clothing?: A Lot 6 Click Score: 15    End of Session Equipment Utilized During Treatment: Rolling walker (2 wheels);Gait belt  OT Visit Diagnosis: Unsteadiness on feet (R26.81);Muscle weakness (generalized) (M62.81);Other symptoms and signs involving cognitive function;Other abnormalities of gait and mobility (R26.89)   Activity Tolerance Patient tolerated treatment well   Patient Left in bed;with call bell/phone within reach;with bed alarm set   Nurse Communication Mobility status        Time: 7741-2878 OT Time Calculation (min): 18 min  Charges: OT General Charges $OT Visit: 1 Visit OT Treatments $Therapeutic Activity: 8-22 mins  Laverle Hobby, OTR/L, CBIS Acute Rehab Office: [place] 01/24/2022, 3:15 PM

## 2022-01-24 NOTE — Progress Notes (Signed)
Nutrition Follow-up  DOCUMENTATION CODES:   Underweight  INTERVENTION:  - continue Ensure Enlive po BID, each supplement provides 350 kcal and 20 grams of protein.  NUTRITION DIAGNOSIS:   Increased nutrient needs related to acute illness (COVID) as evidenced by estimated needs.  GOAL:   Patient will meet greater than or equal to 90% of their needs - Ongoing   MONITOR:   PO intake, Supplement acceptance, Labs, Weight trends  REASON FOR ASSESSMENT:   Consult Assessment of nutrition requirement/status  ASSESSMENT:   88 y.o. female presented to the ED with cough and mentation changes. PMH includes CKD IIIA, SDH, and HTN. Pt admitted with acute metabolic encephalopathy, COVID, and severe dehydration.  Meds includes:  MVI. Labs reviewed: Na low.   Pt is oriented x1 and planning to discharge to SNF today. Pt ate 50% of both of her meals today. Per record, intakes vary over past 7 days, 65% of her meals the patient was eating 50% or greater. Will plan to continue Ensure shakes and monitor POC.   Diet Order:   Diet Order             DIET DYS 2 Room service appropriate? Yes; Fluid consistency: Thin  Diet effective now                   EDUCATION NEEDS:   No education needs have been identified at this time  Skin:  Skin Assessment: Reviewed RN Assessment  Last BM:  Unknown  Height:   Ht Readings from Last 1 Encounters:  01/14/22 5\' 4"  (1.626 m)    Weight:   Wt Readings from Last 1 Encounters:  01/19/22 47.6 kg    Ideal Body Weight:  54.6 kg  BMI:  Body mass index is 18.01 kg/m.  Estimated Nutritional Needs:   Kcal:  1400-1600  Protein:  70-85 grams  Fluid:  >/= 1.5 L  Thalia Bloodgood, RD, LDN, CNSC

## 2022-01-24 NOTE — Progress Notes (Signed)
Physical Therapy Treatment Patient Details Name: Bethany Blake MRN: [id number] DOB: March 26, 1922 Today's Date: 01/24/2022   History of Present Illness pt is a 88 y/o female admitted 10/7 for worsening cough and mentation changes.  Daughter reports recent loose bowels/diarrhea plus recent visits from family members found to have COVID, and episodic confusion with general weakness. +COVID  PMHx, CKD, HTN, arthritis    PT Comments    Patient more alert today and willing to work on mobility. Progressed to needing only min assist to come to sit upright, but mod assist to fully scoot forward and get feet on the floor in prep for standing. Patient feels more secure with her shoes on and despite posterior bias as coming to stand, her feet do not slide forwards. Manual and verbal cues to get pt balancing over her feet and able to progress to ambulation with min assist for safety x 40 ft with rollator. Returned to sitting up in recliner with daughter present, all needs met, and chair alarm in place.     Recommendations for follow up therapy are one component of a multi-disciplinary discharge planning process, led by the attending physician.  Recommendations may be updated based on patient status, additional functional criteria and insurance authorization.  Follow Up Recommendations  Skilled nursing-short term rehab (<3 hours/day) Can patient physically be transported by private vehicle: No   Assistance Recommended at Discharge Intermittent Supervision/Assistance  Patient can return home with the following A little help with walking and/or transfers;A little help with bathing/dressing/bathroom;Assistance with cooking/housework;Assist for transportation;Help with stairs or ramp for entrance;Direct supervision/assist for medications management;Direct supervision/assist for financial management   Equipment Recommendations  None recommended by PT    Recommendations for Other Services       Precautions /  Restrictions Precautions Precautions: Fall Restrictions Weight Bearing Restrictions: No     Mobility  Bed Mobility Overal bed mobility: Needs Assistance Bed Mobility: Supine to Sit     Supine to sit: Min assist, HOB elevated, Mod assist     General bed mobility comments: able to pull to sitting with rail and min assist; required mod assist to scoot out to EOB to get feet on the floor    Transfers Overall transfer level: Needs assistance Equipment used: Rollator (4 wheels) Transfers: Sit to/from Stand Sit to Stand: Min assist           General transfer comment: Increased posterior lean initiially in standing with flexed knees.    Ambulation/Gait Ambulation/Gait assistance: Min assist Gait Distance (Feet): 40 Feet Assistive device: Rollator (4 wheels) Gait Pattern/deviations: Step-through pattern, Decreased stride length, Trunk flexed       General Gait Details: pt able to maneuver rollator independently in small space around obstacles; min assist for balance during initial steps due to posterior bias   Stairs             Wheelchair Mobility    Modified Rankin (Stroke Patients Only)       Balance Overall balance assessment: Needs assistance Sitting-balance support: Feet supported, No upper extremity supported Sitting balance-Leahy Scale: Fair Sitting balance - Comments: once feet supported on floor with shoes on   Standing balance support: Reliant on assistive device for balance Standing balance-Leahy Scale: Poor Standing balance comment: Pt needs support from the rollator as well as therapist assist secondary to posterior lean  Cognition Arousal/Alertness: Awake/alert Behavior During Therapy: Flat affect                                   General Comments: Pt was not oriented to place, time, or situation.        Exercises      General Comments General comments (skin integrity, edema,  etc.): Daughter present; very encouraging      Pertinent Vitals/Pain Pain Assessment Pain Assessment: No/denies pain    Home Living                          Prior Function            PT Goals (current goals can now be found in the care plan section) Acute Rehab PT Goals Patient Stated Goal: agreed to get up and walk PT Goal Formulation: With patient/family Time For Goal Achievement: 01/29/22 Potential to Achieve Goals: Good Progress towards PT goals: Progressing toward goals    Frequency    Min 2X/week      PT Plan Current plan remains appropriate;Frequency needs to be updated    Co-evaluation              AM-PAC PT "6 Clicks" Mobility   Outcome Measure  Help needed turning from your back to your side while in a flat bed without using bedrails?: A Little Help needed moving from lying on your back to sitting on the side of a flat bed without using bedrails?: A Little Help needed moving to and from a bed to a chair (including a wheelchair)?: A Little Help needed standing up from a chair using your arms (e.g., wheelchair or bedside chair)?: A Little Help needed to walk in hospital room?: A Little Help needed climbing 3-5 steps with a railing? : Total 6 Click Score: 16    End of Session Equipment Utilized During Treatment: Gait belt Activity Tolerance: Patient tolerated treatment well Patient left: with call bell/phone within reach;in chair;with chair alarm set;with family/visitor present Nurse Communication: Mobility status PT Visit Diagnosis: Muscle weakness (generalized) (M62.81)     Time: 5945-8592 PT Time Calculation (min) (ACUTE ONLY): 20 min  Charges:  $Gait Training: 8-22 mins                      [place]  Office [phone]    Rexanne Mano 01/24/2022, 11:14 AM

## 2022-01-24 NOTE — TOC Progression Note (Addendum)
Transition of Care [facility]) - Initial/Assessment Note    Patient Details  Name: Bethany Blake MRN: [id number] Date of Birth: Apr 18, 1921  Transition of Care [facility]) CM/SW Contact:    Milinda Antis, [place] Phone Number: 01/24/2022, 10:24 AM  Clinical Narrative:                 09:15-  CSW was informed that the insurance company is now requesting updated therapy notes.  CSW informed care  team and requested that an imminent for discharge PT order be placed.    11:50-  CSW contacted [facility] to follow up on transfer information should insurance auth be approved.  There was no answer. CSW left a secure VM requesting a returned call.   14:08- Insurance Josem Kaufmann has still not been received.  An updated PT note has been submitted.  CSW updated the facility that now reports that they can accept the patient tomorrow, pending insurance auth.   TOC will continue to follow.   Patient Goals and CMS Choice        Expected Discharge Plan and Services                                                Prior Living Arrangements/Services                       Activities of Daily Living   ADL Screening (condition at time of admission) Patient's cognitive ability adequate to safely complete daily activities?: No Is the patient deaf or have difficulty hearing?: Yes Does the patient have difficulty seeing, even when wearing glasses/contacts?: No Does the patient have difficulty concentrating, remembering, or making decisions?: Yes Patient able to express need for assistance with ADLs?: No Does the patient have difficulty dressing or bathing?: Yes Independently performs ADLs?: No Communication: Independent Dressing (OT): Needs assistance Is this a change from baseline?: Pre-admission baseline Grooming: Needs assistance Is this a change from baseline?: Pre-admission baseline Feeding: Needs assistance Is this a change from baseline?: Pre-admission baseline Bathing: Needs  assistance Is this a change from baseline?: Pre-admission baseline Toileting: Needs assistance Is this a change from baseline?: Pre-admission baseline In/Out Bed: Needs assistance Is this a change from baseline?: Pre-admission baseline Walks in Home: Independent with device (comment), Needs assistance Is this a change from baseline?: Pre-admission baseline Does the patient have difficulty walking or climbing stairs?: Yes Weakness of Legs: Both Weakness of Arms/Hands: Both  Permission Sought/Granted                  Emotional Assessment              Admission diagnosis:  Fall, initial encounter [W19.XXXA] Pneumonia of right lung due to infectious organism, unspecified part of lung [J18.9] COVID [U07.1] COVID-19 [U07.1] Patient Active Problem List   Diagnosis Date Noted   Aspiration pneumonia ([place]) 01/15/2022   Pneumonia due to COVID-19 virus 01/14/2022   COVID 01/14/2022   Syncope 06/19/2021   Normocytic anemia 06/19/2021   Stage 3a chronic kidney disease (CKD) ([place]) 06/19/2021   Subdural hematoma ([place]) 06/19/2021   Ulcer of heel and midfoot ([place]) 02/27/2020   E-coli UTI    Acute encephalopathy 01/23/2020   Urinary tract infection without hematuria    Bilateral subdural hematomas ([place]) 12/31/2019   Subarachnoid hemorrhage ([place]) 12/31/2019   Hypothyroidism 12/31/2019  Fall at home, initial encounter 12/31/2019   Aneurysm of right internal carotid artery 12/31/2019   Physical deconditioning 09/02/2014   SIADH (syndrome of inappropriate ADH production) ([place]) 08/28/2014   Essential hypertension 08/28/2014   Confusion 08/28/2014   AKI (acute kidney injury) ([place]) 08/28/2014   Hypochloremia 08/28/2014   PCP:  [name], MD Pharmacy:   [place], [place] - [address] [place] [place] [address] Phone: [phone] Fax: [phone]     Social Determinants of Health (SDOH) Interventions    Readmission  Risk Interventions     No data to display

## 2022-01-24 NOTE — Discharge Summary (Addendum)
Physician Discharge Summary  Cheyney Veleta Briles [id number] DOB: 08/05/1921 DOA: 01/14/2022  PCP: [name], MD  Admit date: 01/14/2022 Discharge date: 01/25/2022  No changes from prior discharge summary-not on Lyrica Can go to skilled rehab when bed available  Time spent: 27 minutes  Recommendations for Outpatient Follow-up:  Requires CBC Chem-12 in about 1 week at facility  Would force fluids up to about 1-1 and a 1/2 L daily orally needs TSH in 3 to 4 weeks  Addendum on 01/25/2022 Patient is waiting to be discharged to SNF once bed is available.  Patient seen and examined at bedside.  Currently medically stable for discharge.   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  Right-sided pneumonia hip x-ray no fracture x-ray arm no radial fracture COVID-positive Admitted with COVID-placed on Decadron, remdesivir  Hospital Course:  COVID-19 IV Decadron given initially ---on orals-stop date 10/16 Remdesivir 3 days completed on 10/10 Continues to improve without  tachycardia or hypoxia--no CT needed at this time, despite elevated dimer   Likely aspiration pneumonia CXR right side 10/8 suggestive pneumonia Completed abx 10/13 SLP graduated to Dys -2--tolerating fair   Fall-no fractures on imaging Therapy is seen they recommend skilled--- she is minimally ambulatory but was able to get to the chair with some assistance   Toxic encephalopathy on admission probably from aspiration Palliative following DNR confirmed Her mental status has improved   Mild prerenal azotemia CKD 2 Mild hyperkalemia Would recommend forcing fluids and hydrating--can get labs in a week coherent more awake no distress   Chronic indwelling Foley and Asymptomatic bacteriuria-no treatment at this time   Hypomagnesemia-resolved with replacement   Adult failure to thrive, protein energy malnutrition with BMI of 16 Expected given age   Discharge Exam: Vitals:   01/23/22 2256 01/24/22 0545  BP: (!) 115/54 (!) 141/62  Pulse: 70 69  Resp: 16 18  Temp: 98.8 F (37.1 C) 98.9 F (37.2 C)  SpO2: 98% 97%    Subj on day of d/c   Coherent more awake in no distress Sitting up in bed no issues  General Exam on discharge  Frail elderly white female no distress S1-S2 no murmur no rub no gallop Chest is clear no rales no rhonchi no wheeze ROM intact no focal deficit Abdomen is soft no rebound no guarding  Discharge Instructions    Allergies as of 01/24/2022       Reactions   Ace Inhibitors Other (See Comments)   Severe hyponatremia   Aldactone [spironolactone] Other (See Comments)   Severe hyponatremia   Angiotensin Receptor Blockers Other (See Comments)   Severe hyponatremia   Sulfa Antibiotics  Rash        Medication List     STOP taking these medications    carvedilol 3.125 MG tablet Commonly known as: COREG   hydrALAZINE 50 MG tablet Commonly known as: APRESOLINE   levETIRAcetam 500 MG tablet Commonly known as: KEPPRA       TAKE these  medications    acetaminophen 500 MG tablet Commonly known as: TYLENOL Take 500 mg by mouth daily as needed (pain).   cholecalciferol 25 MCG (1000 UNIT) tablet Commonly known as: VITAMIN D3 Take 1,000 Units by mouth daily with lunch.   dexamethasone 6 MG tablet Commonly known as: DECADRON Take 1 tablet (6 mg total) by mouth daily.   guaiFENesin-dextromethorphan 100-10 MG/5ML syrup Commonly known as: ROBITUSSIN DM Take 10 mLs by mouth every 4 (four) hours as needed for cough.   levothyroxine 112 MCG tablet Commonly known as: SYNTHROID Take 1 tablet (112 mcg total) by mouth daily at 6 (six) AM. What changed: when to take this       Allergies  Allergen Reactions   Ace Inhibitors Other (See Comments)    Severe hyponatremia   Aldactone [Spironolactone] Other (See Comments)    Severe hyponatremia   Angiotensin Receptor Blockers Other (See Comments)    Severe hyponatremia   Sulfa Antibiotics Rash    Contact information for after-discharge care     Destination     [facility] .   Service: Skilled Nursing Contact information: [place] [phone]                      The results of significant diagnostics from this hospitalization (including imaging, microbiology, ancillary and laboratory) are listed below for reference.    Significant Diagnostic Studies: DG Swallowing Func-Speech Pathology  Result Date: 01/17/2022 Table formatting from the original result was not included. Images from the original result were not included. Objective Swallowing Evaluation: Type of Study: MBS-Modified Barium Swallow Study  Patient Details Name: SHADE HARDING MRN: [id number] Date of Birth: 1921-09-05 Today's Date: 01/17/2022 Time: SLP Start Time (ACUTE ONLY): 1527 -SLP Stop Time (ACUTE ONLY): 1543 SLP Time Calculation (min) (ACUTE ONLY): 16 min Past Medical History: Past Medical History: Diagnosis Date   Anxiety   Arthritis   Chronic kidney disease   Hypertension   Hypokalemia   Hyponatremia   Hypothyroidism  Past Surgical History: Past Surgical History: Procedure Laterality Date  SHOULDER SURGERY    WRIST SURGERY   HPI: Pt is a 88 y.o. female who was sent from facility for evaluation of worsening of cough and mentation changes. CXR negative. Dx COVID-19, acute metaboloic encephalopathy. PMH: dementia, HTN, CKD stage II, chronic hyponatremia, hypothyroidism.  No data recorded  Recommendations for follow up therapy are one component of a multi-disciplinary discharge planning process, led by the attending physician.  Recommendations may be updated based on patient status, additional functional criteria and insurance authorization. Assessment / Plan / Recommendation   01/17/2022   3:11 PM Clinical Impressions Clinical Impression Pt presents with oropharyngeal dysphagia characterized by impaired posterior propulsion, a pharyngeal delay and reduction in bolus cohesion, tongue base retraction, and pharyngeal constriction. She demonstrated lingual pumping, premature spillage to the valleculae, vallecular residue and posterior pharyngeal wall residue. Residue was improved with a liquid wash. Penetration (PAS 2; considered WNL) was noted with thin liquids via cup and this progressed to PAS 3 with consecutive swallows of thin liquids when straws were used. A single  instance of trace aspiration (PAS 7) was noted after penetration (PAS 3) of thin liquids via straw. This triggered subtle coughing which was ineffective, but aspirated material was expelled with prompts for pt to cough more. Transport of the barium tablet was halted at the level of the valleculae and upper esophagus, but movement was facilitated with additional boluses of thin liquids. A dysphagia 2 diet with thin liquids is recommended at this time with strict observance of swallowing precautions. SLP continue to follow pt. SLP Visit Diagnosis Dysphagia,  oropharyngeal phase (R13.12) Impact on safety and function Mild aspiration risk     01/17/2022   3:11 PM Treatment Recommendations Treatment Recommendations Therapy as outlined in treatment plan below     01/17/2022   3:11 PM Prognosis Prognosis for Safe Diet Advancement Good Barriers to Reach Goals Cognitive deficits   01/17/2022   3:11 PM Diet Recommendations SLP Diet Recommendations Dysphagia 2 (Fine chop) solids;Thin liquid Liquid Administration via Cup;No straw Medication Administration Whole meds with puree Compensations Minimize environmental distractions;Small sips/bites;Follow solids with liquid Postural Changes Seated upright at 90 degrees     01/17/2022   3:11 PM Other Recommendations Oral Care Recommendations Oral care BID Follow Up Recommendations Skilled nursing-short term rehab (<3 hours/day) Assistance recommended at discharge Frequent or constant Supervision/Assistance Functional Status Assessment Patient has had a recent decline in their functional status and demonstrates the ability to make significant improvements in function in a reasonable and predictable amount of time.   01/17/2022   3:11 PM Frequency and Duration  Speech Therapy Frequency (ACUTE ONLY) min 2x/week Treatment Duration 2 weeks     01/17/2022   3:11 PM Oral Phase Oral Phase Impaired Oral - Nectar Straw Decreased bolus cohesion;Premature spillage;Reduced posterior propulsion Oral - Thin Cup Decreased bolus cohesion;Premature spillage;Reduced posterior propulsion Oral - Thin Straw Decreased bolus cohesion;Premature spillage;Reduced posterior propulsion Oral - Puree Delayed oral transit;Decreased bolus cohesion;Premature spillage;Reduced posterior propulsion;Lingual pumping Oral - Mech Soft Delayed oral transit;Decreased bolus cohesion;Premature spillage;Reduced posterior propulsion;Lingual pumping;Impaired mastication Oral - Pill Delayed oral transit;Decreased bolus cohesion;Premature spillage;Reduced posterior propulsion;Lingual  pumping    01/17/2022   3:11 PM Pharyngeal Phase Pharyngeal Phase Impaired Pharyngeal- Nectar Straw Reduced tongue base retraction;Reduced pharyngeal peristalsis;Delayed swallow initiation-vallecula;Pharyngeal residue - valleculae;Pharyngeal residue - posterior pharnyx Pharyngeal- Thin Cup Reduced tongue base retraction;Reduced pharyngeal peristalsis;Delayed swallow initiation-vallecula;Pharyngeal residue - valleculae;Pharyngeal residue - posterior pharnyx;Penetration/Aspiration during swallow Pharyngeal Material enters airway, remains ABOVE vocal cords then ejected out Pharyngeal- Thin Straw Reduced tongue base retraction;Reduced pharyngeal peristalsis;Delayed swallow initiation-vallecula;Pharyngeal residue - valleculae;Pharyngeal residue - posterior pharnyx Pharyngeal Material enters airway, remains ABOVE vocal cords and not ejected out Pharyngeal- Puree Reduced tongue base retraction;Reduced pharyngeal peristalsis;Delayed swallow initiation-vallecula;Pharyngeal residue - valleculae;Pharyngeal residue - posterior pharnyx Pharyngeal- Mechanical Soft Reduced tongue base retraction;Reduced pharyngeal peristalsis;Delayed swallow initiation-vallecula;Pharyngeal residue - valleculae;Pharyngeal residue - posterior pharnyx Pharyngeal- Pill Reduced tongue base retraction;Reduced pharyngeal peristalsis;Delayed swallow initiation-vallecula;Pharyngeal residue - valleculae;Pharyngeal residue - posterior pharnyx     No data to display    Shanika I. Hardin Negus, [place], [place] Office number [phone] Horton Marshall 01/17/2022, 4:05 PM                     DG Chest 1 View  Result Date: 01/15/2022 CLINICAL DATA:  [id number]. Pneumonia follow-up. Question pulmonary edema or pleural effusion. EXAM: CHEST  1 VIEW COMPARISON:  AP Lat chest yesterday at 11:05 a.m. FINDINGS: 4:51 a.m. There is increased right infrahilar streaky opacity concerning for pneumonia or aspiration with increased  small right  pleural effusion. Remaining lungs remain clear with COPD change. The heart moderately enlarged with normal caliber central vessels. The aorta is tortuous and calcified with stable mediastinum. Osteopenia and advanced chronic rotator cuff arthropathy are chronically noted, with chronic superior subluxation and medial proximal shaft remodeling of the right humerus. IMPRESSION: Worsening right infrahilar opacity worrisome for pneumonia or aspiration. Increased small right pleural effusion. Electronically Signed   By: Telford Nab M.D.   On: 01/15/2022 06:18   DG Elbow 2 Views Left  Result Date: 01/14/2022 CLINICAL DATA:  Elbow abnormality on humeral radiographs EXAM: LEFT ELBOW - 2 VIEW COMPARISON:  LEFT humeral radiographs 01/14/2022 FINDINGS: Osseous demineralization. Tiny olecranon spur. 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 Associated joint effusion. No acute fracture or dislocation identified Degenerative changes at elbow joint. IMPRESSION: Degenerative changes LEFT elbow joint. 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 Electronically Signed   By: Lavonia Dana M.D.   On: 01/14/2022 12:52   DG Hip Unilat W or Wo Pelvis 2-3 Views Left  Result Date: 01/14/2022 CLINICAL DATA:  88 year old female with fall out of bed. Cough and congestion. EXAM: DG HIP (WITH OR WITHOUT PELVIS) 2-3V LEFT COMPARISON:  Pelvis radiographs 11/06/2018. FINDINGS: Bone mineralization is within normal limits for age. Femoral heads are normally located. Evidence of chronic pubic rami fractures. No definite acute pelvis fracture or dislocation. Grossly intact proximal right femur. Proximal left femur appears intact. Nonobstructed visible bowel gas pattern with retained stool in the rectum. Aortoiliac calcified atherosclerosis. IMPRESSION: No acute fracture or dislocation identified about the left hip or pelvis. If occult hip fracture is suspected or if the  patient is unable to weightbear, MRI is the preferred modality for further evaluation. Electronically Signed   By: Genevie Ann M.D.   On: 01/14/2022 11:27   DG Humerus Left  Result Date: 01/14/2022 CLINICAL DATA:  88 year old female with fall out of bed. Cough and congestion. EXAM: LEFT HUMERUS - 2+ VIEW COMPARISON:  Left elbow series 01/03/2011. FINDINGS: Osteopenia. Left humerus appears intact, but there is evidence of chronic bone resorption of the proximal radius which was fractured in 2012. Grossly maintained other alignment at the left elbow and shoulder. Negative visible left ribs and chest. IMPRESSION: 1. Osteopenia. No definite acute fracture or dislocation identified about the left humerus. 2. Suspect chronic resorption of bone from the proximal left radius since 2012. Electronically Signed   By: Genevie Ann M.D.   On: 01/14/2022 11:26   DG Chest 2 View  Result Date: 01/14/2022 CLINICAL DATA:  88 year old female with fall out of bed. Cough and congestion. EXAM: CHEST - 2 VIEW COMPARISON:  Portable chest 01/23/2020 and earlier. FINDINGS: Supine AP and lateral views of the chest at 1111 hours. Cardiomegaly. Tortuous and calcified thoracic aorta. Mediastinal contours appear stable since 2021. Visualized tracheal air column is within normal limits. No pneumothorax, pulmonary edema, pleural effusion, or consolidation identified. Osteopenia and chronic kyphoscoliosis. Chronic deformity proximal right humerus appears stable. No acute osseous abnormality identified. Negative visible bowel gas. IMPRESSION: 1. No acute cardiopulmonary abnormality. 2. Cardiomegaly and Aortic Atherosclerosis (ICD10-I70.0). 3. Osteopenia and spinal kyphoscoliosis. Electronically Signed   By: Genevie Ann M.D.   On: 01/14/2022 11:25   CT HEAD WO CONTRAST  Result Date: 01/14/2022 CLINICAL DATA:  Un witnessed fall this morning. EXAM: CT HEAD WITHOUT CONTRAST CT CERVICAL SPINE WITHOUT CONTRAST TECHNIQUE: Multidetector CT imaging of the head  and cervical  spine was performed following the standard protocol without intravenous contrast. Multiplanar CT image reconstructions of the cervical spine were also generated. RADIATION DOSE REDUCTION: This exam was performed according to the departmental dose-optimization program which includes automated exposure control, adjustment of the mA and/or kV according to patient size and/or use of iterative reconstruction technique. COMPARISON:  06/19/2021. FINDINGS: CT HEAD FINDINGS Brain: No evidence of acute infarction, hemorrhage, hydrocephalus, extra-axial collection or mass lesion/mass effect. 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 No hypervascular skull base vessel. Skull: Normal. Negative for fracture or focal lesion. Sinuses/Orbits: Globes and orbits are unremarkable. Sinuses essentially clear. Other: None. CT CERVICAL SPINE FINDINGS Alignment: Kyphosis, apex at C4. Slight anterolisthesis of C2 on C3 and C3 on C4, degenerative. These findings are stable. Skull base and vertebrae: No acute fracture. No primary bone lesion or focal pathologic process. Soft tissues and spinal canal: No prevertebral fluid or swelling. No visible canal hematoma. Disc levels: Mild loss of disc height at C2-C3 and C3-C4. Marked loss of disc height at C4-C5 and C5-C6. Moderate loss of disc height at C6-C7. Bilateral facet degenerative changes. No convincing disc herniation. Upper chest: No acute findings. Other: None. IMPRESSION: HEAD CT 1. No acute intracranial abnormalities. CERVICAL CT 1. No fracture or acute finding. Electronically Signed   By: Lajean Manes M.D.   On: 01/14/2022 11:10   CT CERVICAL SPINE WO CONTRAST  Result Date: 01/14/2022 CLINICAL DATA:  Un witnessed fall this morning. EXAM: CT HEAD WITHOUT CONTRAST CT CERVICAL SPINE WITHOUT CONTRAST TECHNIQUE: Multidetector CT imaging of the head and cervical spine was performed following the standard protocol without intravenous contrast.  Multiplanar CT image reconstructions of the cervical spine were also generated. RADIATION DOSE REDUCTION: This exam was performed according to the departmental dose-optimization program which includes automated exposure control, adjustment of the mA and/or kV according to patient size and/or use of iterative reconstruction technique. COMPARISON:  06/19/2021. FINDINGS: CT HEAD FINDINGS Brain: No evidence of acute infarction, hemorrhage, hydrocephalus, extra-axial collection or mass lesion/mass effect. 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 No hypervascular skull base vessel. Skull: Normal. Negative for fracture or focal lesion. Sinuses/Orbits: Globes and orbits are unremarkable. Sinuses essentially clear. Other: None. CT CERVICAL SPINE FINDINGS Alignment: Kyphosis, apex at C4. Slight anterolisthesis of C2 on C3 and C3 on C4, degenerative. These findings are stable. Skull base and vertebrae: No acute fracture. No primary bone lesion or focal pathologic process. Soft tissues and spinal canal: No prevertebral fluid or swelling. No visible canal hematoma. Disc levels: Mild loss of disc height at C2-C3 and C3-C4. Marked loss of disc height at C4-C5 and C5-C6. Moderate loss of disc height at C6-C7. Bilateral facet degenerative changes. No convincing disc herniation. Upper chest: No acute findings. Other: None. IMPRESSION: HEAD CT 1. No acute intracranial abnormalities. CERVICAL CT 1. No fracture or acute finding. Electronically Signed   By: Lajean Manes M.D.   On: 01/14/2022 11:10    Microbiology: Recent Results (from the past 240 hour(s))  Resp Panel by RT-PCR (Flu A&B, Covid) Anterior Nasal Swab     Status: Abnormal   Collection Time: 01/14/22 10:34 AM   Specimen: Anterior Nasal Swab  Result Value Ref Range Status   SARS Coronavirus 2 by RT PCR POSITIVE (A) NEGATIVE Final    Comment: (NOTE) SARS-CoV-2 target nucleic acids are DETECTED.  The SARS-CoV-2 RNA is generally  detectable in upper respiratory specimens during the acute phase of infection. Positive results are indicative of  the presence of the identified virus, but do not rule out bacterial infection or co-infection with other pathogens not detected by the test. Clinical correlation with patient history and other diagnostic information is necessary to determine patient infection status. The expected result is Negative.  Fact Sheet for Patients: EntrepreneurPulse.com.au  Fact Sheet for Healthcare Providers: IncredibleEmployment.be  This test is not yet approved or cleared by the [place] FDA and  has been authorized for detection and/or diagnosis of SARS-CoV-2 by FDA under an Emergency Use Authorization (EUA).  This EUA will remain in effect (meaning this test can be used) for the duration of  the COVID-19 declaration under Section 564(b)(1) of the A ct, 21 U.S.C. section 360bbb-3(b)(1), unless the authorization is terminated or revoked sooner.     Influenza A by PCR NEGATIVE NEGATIVE Final   Influenza B by PCR NEGATIVE NEGATIVE Final    Comment: (NOTE) The Xpert Xpress SARS-CoV-2/FLU/RSV plus assay is intended as an aid in the diagnosis of influenza from Nasopharyngeal swab specimens and should not be used as a sole basis for treatment. Nasal washings and aspirates are unacceptable for Xpert Xpress SARS-CoV-2/FLU/RSV testing.  Fact Sheet for Patients: EntrepreneurPulse.com.au  Fact Sheet for Healthcare Providers: IncredibleEmployment.be  This test is not yet approved or cleared by the [place] FDA and has been authorized for detection and/or diagnosis of SARS-CoV-2 by FDA under an Emergency Use Authorization (EUA). This EUA will remain in effect (meaning this test can be used) for the duration of the COVID-19 declaration under Section 564(b)(1) of the Act, 21 U.S.C. section 360bbb-3(b)(1), unless the  authorization is terminated or revoked.  Performed at [facility], [place] [address], [place], [place] [address]   Urine Culture     Status: None   Collection Time: 01/15/22  1:00 PM   Specimen: Urine, Clean Catch  Result Value Ref Range Status   Specimen Description URINE, CLEAN CATCH  Final   Special Requests NONE  Final   Culture   Final    NO GROWTH Performed at [facility], [place] [address], [place], [place] [address]    Report Status 01/17/2022 FINAL  Final     Labs: Basic Metabolic Panel: Recent Labs  Lab 01/18/22 0808 01/19/22 0352 01/23/22 0340  NA 137 137 134*  K 4.3 4.5 5.0  CL 106 103 100  CO2 24 23 27   GLUCOSE 96 117* 100*  BUN 20 23 31*  CREATININE 0.69 0.85 0.84  CALCIUM 8.2* 8.8* 8.4*  MG 1.8 1.8  --   PHOS 2.6 2.9  --     Liver Function Tests: Recent Labs  Lab 01/18/22 0808 01/19/22 0352  AST 19 21  ALT 14 14  ALKPHOS 37* 37*  BILITOT 0.3 0.6  PROT 4.8* 4.4*  ALBUMIN 2.4* 2.2*    No results for input(s): "LIPASE", "AMYLASE" in the last 168 hours. No results for input(s): "AMMONIA" in the last 168 hours. CBC: Recent Labs  Lab 01/18/22 0808 01/19/22 0352  WBC 5.3 5.1  NEUTROABS 3.6 3.7  HGB 8.9* 8.2*  HCT 27.8* 24.7*  MCV 80.8 79.4*  PLT 446* 393    Cardiac Enzymes: No results for input(s): "CKTOTAL", "CKMB", "CKMBINDEX", "TROPONINI" in the last 168 hours. BNP: BNP (last 3 results) No results for input(s): "BNP" in the last 8760 hours.  ProBNP (last 3 results) No results for input(s): "PROBNP" in the last 8760 hours.  CBG: Recent Labs  Lab 01/21/22 1123 01/21/22 1623 01/22/22 0731 01/22/22  1119 01/22/22 1714  GLUCAP 97 139* 75 104* 110*        Signed:  Nita Sells MD   [facility] 01/24/2022, 8:23 AM

## 2022-01-25 NOTE — Progress Notes (Signed)
DISCHARGE NOTE [place] to be discharged Skilled nursing facility per MD order. Discussed prescriptions and follow up appointments with the patient. Report called to Walker Kehr, LPN. Medication list explained in detail.   Skin clean, dry and intact without evidence of skin break down, no evidence of skin tears noted. IV catheter discontinued intact. Site without signs and symptoms of complications. Dressing and pressure applied. Pt denies pain at the site currently. No complaints noted.  Patient free of lines, drains, and wounds.   An After Visit Summary (AVS) was printed and given to PTAR. Patient transported via stretcher with PTAR in stable condition  Anastasio Auerbach, RN

## 2022-01-25 NOTE — Care Management Important Message (Signed)
Important Message  Patient Details  Name: Bethany Blake MRN: [id number] Date of Birth: May 16, 1921   Medicare Important Message Given:  Yes   Patient left prior to IM delivery will mail to the patient home address.  Izabella Marcantel 01/25/2022, 2:15 PM

## 2022-01-25 NOTE — TOC Transition Note (Signed)
Transition of Care [facility]) - CM/SW Discharge Note   Patient Details  Name: Bethany Blake MRN: [id number] Date of Birth: Nov 13, 1921  Transition of Care [facility]) CM/SW Contact:  Milinda Antis, LCSWA Phone Number: 01/25/2022, 10:55 AM   Clinical Narrative:    Patient will DC to:  [facility] Anticipated DC date:  01/25/2022 Family notified:  Yes Transport by:  Corey Harold   Per MD patient ready for DC to SNF. RN to call report prior to discharge [phone] room 3218). RN, patient, patient's family, and facility notified of DC. Discharge Summary sent to facility. DC packet on chart. Ambulance transport requested for patient.   CSW will sign off for now as social work intervention is no longer needed. Please consult [place] again if new needs arise.     Final next level of care: Skilled Nursing Facility Barriers to Discharge: Barriers Resolved   Patient Goals and CMS Choice Patient states their goals for this hospitalization and ongoing recovery are:: To get back to independing living facility   Choice offered to / list presented to : Adult Children  Discharge Placement              Patient chooses bed at:  ([facility]) Patient to be transferred to facility by: [place] Name of family member notified: Jon Billings (Daughter)   [phone] Patient and family notified of of transfer: 01/25/22  Discharge Plan and Services                                     Social Determinants of Health (SDOH) Interventions     Readmission Risk Interventions     No data to display

## 2022-01-25 NOTE — Progress Notes (Signed)
Patient is waiting to be discharged to SNF once bed is available.  Patient seen and examined at bedside.  Currently medically stable for discharge.  Please refer to the full discharge summary done by Dr. Verlon Au on 01/24/2022 for full details.

## 2022-01-25 NOTE — Plan of Care (Signed)
  Problem: Education: Goal: Knowledge of risk factors and measures for prevention of condition will improve Outcome: Completed/Met   Problem: Coping: Goal: Psychosocial and spiritual needs will be supported Outcome: Completed/Met   Problem: Respiratory: Goal: Will maintain a patent airway Outcome: Completed/Met Goal: Complications related to the disease process, condition or treatment will be avoided or minimized Outcome: Completed/Met   Problem: Education: Goal: Knowledge of General Education information will improve Description: Including pain rating scale, medication(s)/side effects and non-pharmacologic comfort measures Outcome: Completed/Met   Problem: Health Behavior/Discharge Planning: Goal: Ability to manage health-related needs will improve Outcome: Completed/Met   Problem: Clinical Measurements: Goal: Ability to maintain clinical measurements within normal limits will improve Outcome: Completed/Met Goal: Will remain free from infection Outcome: Completed/Met Goal: Diagnostic test results will improve Outcome: Completed/Met Goal: Respiratory complications will improve Outcome: Completed/Met Goal: Cardiovascular complication will be avoided Outcome: Completed/Met   Problem: Health Behavior/Discharge Planning: Goal: Ability to manage health-related needs will improve Outcome: Completed/Met   Problem: Activity: Goal: Risk for activity intolerance will decrease Outcome: Completed/Met   Problem: Nutrition: Goal: Adequate nutrition will be maintained Outcome: Completed/Met   Problem: Coping: Goal: Level of anxiety will decrease Outcome: Completed/Met   Problem: Elimination: Goal: Will not experience complications related to bowel motility Outcome: Completed/Met Goal: Will not experience complications related to urinary retention Outcome: Completed/Met   Problem: Pain Managment: Goal: General experience of comfort will improve Outcome: Completed/Met    Problem: Safety: Goal: Ability to remain free from injury will improve Outcome: Completed/Met   Problem: Skin Integrity: Goal: Risk for impaired skin integrity will decrease Outcome: Completed/Met

## 2022-01-25 NOTE — Care Management Important Message (Signed)
Important Message  Patient Details  Name: Bethany Blake MRN: [id number] Date of Birth: 1922-01-27   Medicare Important Message Given:  Yes     Orbie Pyo 01/25/2022, 2:14 PM

## 2022-03-10 ENCOUNTER — Ambulatory Visit: Payer: Medicare Other | Admitting: Podiatry

## 2022-03-13 ENCOUNTER — Ambulatory Visit: Payer: Medicare Other | Admitting: Podiatry

## 2022-06-01 ENCOUNTER — Encounter: Payer: Self-pay | Admitting: Podiatry

## 2022-06-23 ENCOUNTER — Ambulatory Visit: Payer: Medicare Other | Admitting: Podiatry

## 2022-06-26 ENCOUNTER — Ambulatory Visit: Payer: Medicare Other | Admitting: Podiatry

## 2022-06-26 ENCOUNTER — Encounter: Payer: Self-pay | Admitting: Podiatry

## 2022-06-26 DIAGNOSIS — M79676 Pain in unspecified toe(s): Secondary | ICD-10-CM | POA: Diagnosis not present

## 2022-06-26 DIAGNOSIS — N1831 Chronic kidney disease, stage 3a: Secondary | ICD-10-CM | POA: Diagnosis not present

## 2022-06-26 DIAGNOSIS — B351 Tinea unguium: Secondary | ICD-10-CM | POA: Diagnosis not present

## 2022-06-26 NOTE — Progress Notes (Signed)
This patient returns to my office for at risk foot care.  This patient requires this care by a professional since this patient will be at risk due to having kidney injury.    This patient is unable to cut nails herself since the patient cannot reach her nails.These nails are painful walking and wearing shoes.   This patient presents for at risk foot care today.  General Appearance  Alert, conversant and in no acute stress.  Vascular  Dorsalis pedis pulses are  weakly palpable  bilaterally. Posterior tibial pulses are absent  B/L.  Capillary return is within normal limits  bilaterally. Cold feet  B/L.   Bilaterally.  Absent hair both feet.  Neurologic  Senn-Weinstein monofilament wire test within normal limits  bilaterally. Muscle power within normal limits bilaterally.  Nails Thick disfigured discolored nails with subungual debris  from hallux to fifth toes bilaterally. No evidence of bacterial infection or drainage bilaterally.  Orthopedic  No limitations of motion  feet .  No crepitus or effusions noted.  No bony pathology or digital deformities noted.Hammer toes  B/l.  Overlapping 3rd toe right foot.  Skin  normotropic skin with no porokeratosis noted bilaterally.  No signs of infections or ulcers noted.   Porokeratosis sub 2 left and sub 3 right asymptomatic.  Onychomycosis  Pain in right toes  Pain in left toes   Consent was obtained for treatment procedures.   Mechanical debridement of nails 1-5  bilaterally performed with a nail nipper.  Filed with dremel without incident.    Return office visit  3 months                    Told patient to return for periodic foot care and evaluation due to potential at risk complications.   Gardiner Barefoot DPM

## 2022-10-26 ENCOUNTER — Ambulatory Visit: Payer: Medicare Other | Admitting: Podiatry

## 2022-11-10 ENCOUNTER — Ambulatory Visit: Payer: Medicare Other | Admitting: Podiatry

## 2023-01-12 ENCOUNTER — Ambulatory Visit: Payer: Medicare Other | Admitting: Podiatry

## 2023-01-12 ENCOUNTER — Encounter: Payer: Self-pay | Admitting: Podiatry

## 2023-01-12 DIAGNOSIS — N1831 Chronic kidney disease, stage 3a: Secondary | ICD-10-CM

## 2023-01-12 DIAGNOSIS — B351 Tinea unguium: Secondary | ICD-10-CM

## 2023-01-12 DIAGNOSIS — M79676 Pain in unspecified toe(s): Secondary | ICD-10-CM

## 2023-01-12 NOTE — Progress Notes (Signed)
This patient returns to my office for at risk foot care.  This patient requires this care by a professional since this patient will be at risk due to having kidney injury.    This patient is unable to cut nails herself since the patient cannot reach her nails.These nails are painful walking and wearing shoes.   This patient presents for at risk foot care today.  General Appearance  Alert, conversant and in no acute stress.  Vascular  Dorsalis pedis pulses are  weakly palpable  bilaterally. Posterior tibial pulses are absent  B/L.  Capillary return is within normal limits  bilaterally. Cold feet  B/L.   Bilaterally.  Absent hair both feet.  Neurologic  Senn-Weinstein monofilament wire test within normal limits  bilaterally. Muscle power within normal limits bilaterally.  Nails Thick disfigured discolored nails with subungual debris  from hallux to fifth toes bilaterally. No evidence of bacterial infection or drainage bilaterally.  Orthopedic  No limitations of motion  feet .  No crepitus or effusions noted.  No bony pathology or digital deformities noted.Hammer toes  B/l.  Overlapping 3rd toe right foot.  Skin  normotropic skin with no porokeratosis noted bilaterally.  No signs of infections or ulcers noted.   Porokeratosis sub 2 left and sub 3 right asymptomatic.  Onychomycosis  Pain in right toes  Pain in left toes   Consent was obtained for treatment procedures.   Mechanical debridement of nails 1-5  bilaterally performed with a nail nipper.  Filed with dremel without incident.    Return office visit  3 months                    Told patient to return for periodic foot care and evaluation due to potential at risk complications.   Gardiner Barefoot DPM

## 2023-05-14 ENCOUNTER — Ambulatory Visit: Payer: Medicare Other | Admitting: Podiatry

## 2023-05-15 ENCOUNTER — Emergency Department (HOSPITAL_COMMUNITY): Payer: Medicare Other

## 2023-05-15 ENCOUNTER — Encounter (HOSPITAL_COMMUNITY): Payer: Self-pay

## 2023-05-15 ENCOUNTER — Inpatient Hospital Stay (HOSPITAL_COMMUNITY)
Admission: EM | Admit: 2023-05-15 | Discharge: 2023-05-24 | DRG: 154 | Disposition: A | Discharge status: Hospice | Payer: Medicare Other | Attending: Internal Medicine | Admitting: Internal Medicine

## 2023-05-15 ENCOUNTER — Other Ambulatory Visit: Payer: Self-pay

## 2023-05-15 DIAGNOSIS — E039 Hypothyroidism, unspecified: Secondary | ICD-10-CM | POA: Diagnosis present

## 2023-05-15 DIAGNOSIS — I129 Hypertensive chronic kidney disease with stage 1 through stage 4 chronic kidney disease, or unspecified chronic kidney disease: Secondary | ICD-10-CM | POA: Diagnosis present

## 2023-05-15 DIAGNOSIS — Z711 Person with feared health complaint in whom no diagnosis is made: Secondary | ICD-10-CM | POA: Diagnosis not present

## 2023-05-15 DIAGNOSIS — E43 Unspecified severe protein-calorie malnutrition: Secondary | ICD-10-CM | POA: Diagnosis present

## 2023-05-15 DIAGNOSIS — R64 Cachexia: Secondary | ICD-10-CM | POA: Diagnosis present

## 2023-05-15 DIAGNOSIS — R52 Pain, unspecified: Secondary | ICD-10-CM | POA: Diagnosis not present

## 2023-05-15 DIAGNOSIS — E860 Dehydration: Secondary | ICD-10-CM | POA: Diagnosis present

## 2023-05-15 DIAGNOSIS — E871 Hypo-osmolality and hyponatremia: Secondary | ICD-10-CM | POA: Diagnosis present

## 2023-05-15 DIAGNOSIS — R4589 Other symptoms and signs involving emotional state: Secondary | ICD-10-CM

## 2023-05-15 DIAGNOSIS — Z79899 Other long term (current) drug therapy: Secondary | ICD-10-CM

## 2023-05-15 DIAGNOSIS — Z79890 Hormone replacement therapy (postmenopausal): Secondary | ICD-10-CM | POA: Diagnosis not present

## 2023-05-15 DIAGNOSIS — Z87891 Personal history of nicotine dependence: Secondary | ICD-10-CM | POA: Diagnosis not present

## 2023-05-15 DIAGNOSIS — N1831 Chronic kidney disease, stage 3a: Secondary | ICD-10-CM | POA: Diagnosis present

## 2023-05-15 DIAGNOSIS — N179 Acute kidney failure, unspecified: Principal | ICD-10-CM

## 2023-05-15 DIAGNOSIS — Z7189 Other specified counseling: Secondary | ICD-10-CM

## 2023-05-15 DIAGNOSIS — E870 Hyperosmolality and hypernatremia: Secondary | ICD-10-CM | POA: Diagnosis present

## 2023-05-15 DIAGNOSIS — R451 Restlessness and agitation: Secondary | ICD-10-CM | POA: Diagnosis not present

## 2023-05-15 DIAGNOSIS — R1311 Dysphagia, oral phase: Secondary | ICD-10-CM | POA: Diagnosis present

## 2023-05-15 DIAGNOSIS — Z66 Do not resuscitate: Secondary | ICD-10-CM | POA: Diagnosis present

## 2023-05-15 DIAGNOSIS — E876 Hypokalemia: Secondary | ICD-10-CM | POA: Diagnosis present

## 2023-05-15 DIAGNOSIS — F0394 Unspecified dementia, unspecified severity, with anxiety: Secondary | ICD-10-CM | POA: Diagnosis present

## 2023-05-15 DIAGNOSIS — Z681 Body mass index (BMI) 19 or less, adult: Secondary | ICD-10-CM

## 2023-05-15 DIAGNOSIS — D75838 Other thrombocytosis: Secondary | ICD-10-CM | POA: Diagnosis present

## 2023-05-15 DIAGNOSIS — D631 Anemia in chronic kidney disease: Secondary | ICD-10-CM | POA: Diagnosis present

## 2023-05-15 DIAGNOSIS — E611 Iron deficiency: Secondary | ICD-10-CM | POA: Diagnosis present

## 2023-05-15 DIAGNOSIS — Z9181 History of falling: Secondary | ICD-10-CM | POA: Diagnosis not present

## 2023-05-15 DIAGNOSIS — E162 Hypoglycemia, unspecified: Secondary | ICD-10-CM | POA: Diagnosis not present

## 2023-05-15 DIAGNOSIS — K1120 Sialoadenitis, unspecified: Principal | ICD-10-CM

## 2023-05-15 DIAGNOSIS — R627 Adult failure to thrive: Secondary | ICD-10-CM | POA: Diagnosis present

## 2023-05-15 DIAGNOSIS — Z515 Encounter for palliative care: Secondary | ICD-10-CM | POA: Diagnosis not present

## 2023-05-15 DIAGNOSIS — Z8249 Family history of ischemic heart disease and other diseases of the circulatory system: Secondary | ICD-10-CM

## 2023-05-15 DIAGNOSIS — Z888 Allergy status to other drugs, medicaments and biological substances status: Secondary | ICD-10-CM

## 2023-05-15 DIAGNOSIS — Z882 Allergy status to sulfonamides status: Secondary | ICD-10-CM

## 2023-05-15 DIAGNOSIS — R296 Repeated falls: Secondary | ICD-10-CM | POA: Diagnosis present

## 2023-05-15 DIAGNOSIS — R638 Other symptoms and signs concerning food and fluid intake: Secondary | ICD-10-CM | POA: Diagnosis not present

## 2023-05-15 LAB — CBC WITH DIFFERENTIAL/PLATELET
Abs Immature Granulocytes: 0.05 10*3/uL (ref 0.00–0.07)
Basophils Absolute: 0 10*3/uL (ref 0.0–0.1)
Basophils Relative: 0 %
Eosinophils Absolute: 0 10*3/uL (ref 0.0–0.5)
Eosinophils Relative: 0 %
HCT: 39.2 % (ref 36.0–46.0)
Hemoglobin: 11.6 g/dL — ABNORMAL LOW (ref 12.0–15.0)
Immature Granulocytes: 1 %
Lymphocytes Relative: 7 %
Lymphs Abs: 0.8 10*3/uL (ref 0.7–4.0)
MCH: 23.8 pg — ABNORMAL LOW (ref 26.0–34.0)
MCHC: 29.6 g/dL — ABNORMAL LOW (ref 30.0–36.0)
MCV: 80.5 fL (ref 80.0–100.0)
Monocytes Absolute: 0.4 10*3/uL (ref 0.1–1.0)
Monocytes Relative: 4 %
Neutro Abs: 9.2 10*3/uL — ABNORMAL HIGH (ref 1.7–7.7)
Neutrophils Relative %: 88 %
Platelets: 422 10*3/uL — ABNORMAL HIGH (ref 150–400)
RBC: 4.87 MIL/uL (ref 3.87–5.11)
RDW: 17.8 % — ABNORMAL HIGH (ref 11.5–15.5)
WBC: 10.4 10*3/uL (ref 4.0–10.5)
nRBC: 0 % (ref 0.0–0.2)

## 2023-05-15 LAB — BASIC METABOLIC PANEL
Anion gap: 15 (ref 5–15)
BUN: 50 mg/dL — ABNORMAL HIGH (ref 8–23)
CO2: 20 mmol/L — ABNORMAL LOW (ref 22–32)
Calcium: 8.4 mg/dL — ABNORMAL LOW (ref 8.9–10.3)
Chloride: 114 mmol/L — ABNORMAL HIGH (ref 98–111)
Creatinine, Ser: 1.58 mg/dL — ABNORMAL HIGH (ref 0.44–1.00)
GFR, Estimated: 29 mL/min — ABNORMAL LOW (ref >60)
Glucose, Bld: 115 mg/dL — ABNORMAL HIGH (ref 70–99)
Potassium: 4.2 mmol/L (ref 3.5–5.1)
Sodium: 149 mmol/L — ABNORMAL HIGH (ref 135–145)

## 2023-05-15 LAB — COMPREHENSIVE METABOLIC PANEL
ALT: 8 U/L (ref 0–44)
AST: 17 U/L (ref 15–41)
Albumin: 3.3 g/dL — ABNORMAL LOW (ref 3.5–5.0)
Alkaline Phosphatase: 55 U/L (ref 38–126)
Anion gap: 16 — ABNORMAL HIGH (ref 5–15)
BUN: 56 mg/dL — ABNORMAL HIGH (ref 8–23)
CO2: 24 mmol/L (ref 22–32)
Calcium: 9.4 mg/dL (ref 8.9–10.3)
Chloride: 107 mmol/L (ref 98–111)
Creatinine, Ser: 1.78 mg/dL — ABNORMAL HIGH (ref 0.44–1.00)
GFR, Estimated: 25 mL/min — ABNORMAL LOW (ref >60)
Glucose, Bld: 119 mg/dL — ABNORMAL HIGH (ref 70–99)
Potassium: 5.2 mmol/L — ABNORMAL HIGH (ref 3.5–5.1)
Sodium: 147 mmol/L — ABNORMAL HIGH (ref 135–145)
Total Bilirubin: 1.1 mg/dL (ref 0.0–1.2)
Total Protein: 7.8 g/dL (ref 6.5–8.1)

## 2023-05-15 MED ORDER — ACETAMINOPHEN 325 MG PO TABS
650.0000 mg | ORAL_TABLET | Freq: Four times a day (QID) | ORAL | Status: DC | PRN
  Filled 2023-05-15 (×2): qty 2

## 2023-05-15 MED ORDER — NAFCILLIN SODIUM 1 G IJ SOLR: 1.0000 g | Freq: Four times a day (QID) | INTRAMUSCULAR | Status: DC

## 2023-05-15 MED ORDER — HYDRALAZINE HCL 20 MG/ML IJ SOLN
5.0000 mg | Freq: Four times a day (QID) | INTRAMUSCULAR | Status: DC | PRN
  Administered 2023-05-18 (×2): 5 mg via INTRAVENOUS
  Filled 2023-05-15 (×2): qty 1

## 2023-05-15 MED ORDER — SODIUM CHLORIDE 0.9 % IV SOLN
1.0000 g | Freq: Once | INTRAVENOUS | Status: AC
  Administered 2023-05-15: 1 g via INTRAVENOUS
  Filled 2023-05-15: qty 4

## 2023-05-15 MED ORDER — ACETAMINOPHEN 650 MG RE SUPP: 650.0000 mg | Freq: Four times a day (QID) | RECTAL | Status: DC | PRN

## 2023-05-15 MED ORDER — HEPARIN SODIUM (PORCINE) 5000 UNIT/ML IJ SOLN
5000.0000 [IU] | Freq: Three times a day (TID) | INTRAMUSCULAR | Status: DC
  Administered 2023-05-15 – 2023-05-23 (×23): 5000 [IU] via SUBCUTANEOUS
  Filled 2023-05-15 (×23): qty 1

## 2023-05-15 MED ORDER — ONDANSETRON HCL 4 MG/2ML IJ SOLN: 4.0000 mg | Freq: Four times a day (QID) | INTRAMUSCULAR | Status: DC | PRN

## 2023-05-15 MED ORDER — POLYETHYLENE GLYCOL 3350 17 G PO PACK: 17.0000 g | PACK | Freq: Every day | ORAL | Status: DC | PRN

## 2023-05-15 MED ORDER — SODIUM CHLORIDE 0.9 % IV SOLN: INTRAVENOUS | Status: DC

## 2023-05-15 MED ORDER — SODIUM CHLORIDE 0.9 % IV BOLUS
1000.0000 mL | Freq: Once | INTRAVENOUS | Status: AC
  Administered 2023-05-15: 1000 mL via INTRAVENOUS

## 2023-05-15 MED ORDER — DOCUSATE SODIUM 100 MG PO CAPS
100.0000 mg | ORAL_CAPSULE | Freq: Two times a day (BID) | ORAL | Status: DC
  Administered 2023-05-15: 100 mg via ORAL
  Filled 2023-05-15 (×5): qty 1

## 2023-05-15 MED ORDER — ENOXAPARIN SODIUM 30 MG/0.3ML IJ SOSY: 30.0000 mg | PREFILLED_SYRINGE | INTRAMUSCULAR | Status: DC

## 2023-05-15 MED ORDER — ONDANSETRON HCL 4 MG PO TABS: 4.0000 mg | ORAL_TABLET | Freq: Four times a day (QID) | ORAL | Status: DC | PRN

## 2023-05-15 MED ORDER — SODIUM CHLORIDE 0.9 % IV SOLN
3.0000 g | INTRAVENOUS | Status: DC
  Administered 2023-05-15 – 2023-05-16 (×2): 3 g via INTRAVENOUS
  Filled 2023-05-15 (×2): qty 8

## 2023-05-15 MED ORDER — ALBUTEROL SULFATE (2.5 MG/3ML) 0.083% IN NEBU: 2.5000 mg | INHALATION_SOLUTION | RESPIRATORY_TRACT | Status: DC | PRN

## 2023-05-15 MED ORDER — LEVOTHYROXINE SODIUM 112 MCG PO TABS
112.0000 ug | ORAL_TABLET | Freq: Every day | ORAL | Status: DC
  Filled 2023-05-15: qty 1

## 2023-05-15 NOTE — Care Plan (Signed)
Limited admission assessment; pt difficulty speaking. Pt alert and oriented x1 (to self).  Attempted to contact phone list, unsuccessful

## 2023-05-15 NOTE — H&P (Addendum)
 History and Physical  Bethany Blake FMW:[id number] DOB: [birth year] DOA: 05/15/2023  PCP: Onita Rush, MD   Chief Complaint: Right-sided facial swelling  HPI: Bethany Blake is a 89 y.o. female with medical history significant for CKD, hypertension, advanced dementia being admitted to the hospital with sialoadenitis.  She is somewhat limited as the patient is nonverbal, she is a resident of [facility] nursing facility.  Her son and daughter both live out of state, ER provider spoke with her son over the phone.  Workup as detailed below shows evidence of dehydration, hypernatremia, AKI, and sialoadenitis.  Patient has been started on IV antibiotics, given IV fluids and admitted to the hospitalist service.  Review of Systems: Please see HPI for pertinent positives and negatives.  Review of systems could not be done due to the patient's advanced dementia.  Past Medical History:  Diagnosis Date   Anxiety    Arthritis    Chronic kidney disease    Hypertension    Hypokalemia    Hyponatremia    Hypothyroidism    Past Surgical History:  Procedure Laterality Date   SHOULDER SURGERY     WRIST SURGERY     Social History:  reports that she has quit smoking. She has never used smokeless tobacco. She reports that she does not drink alcohol  and does not use drugs.  Allergies  Allergen Reactions   Ace Inhibitors Other (See Comments)    Severe hyponatremia. Allergy not listed on MAR    Aldactone [Spironolactone] Other (See Comments)    Severe hyponatremia. Allergy not listed on MAR    Angiotensin Receptor Blockers Other (See Comments)    Severe hyponatremia. Allergy not listed on MAR    Sulfa Antibiotics Rash    Allergy not listed on MAR     Family History  Problem Relation Age of Onset   Hypertension Other    Colon cancer Neg Hx    Colon polyps Neg Hx    Rectal cancer Neg Hx    Stomach cancer Neg Hx      Prior to Admission medications   Medication Sig Start Date End Date Taking?  Authorizing Provider  acetaminophen  (TYLENOL ) 500 MG tablet Take 500 mg by mouth every 5 (five) hours as needed (pain).   Yes [provider]  cholecalciferol  (VITAMIN D3) 25 MCG (1000 UNIT) tablet Take 1,000 Units by mouth daily.   Yes [provider]  levothyroxine  (SYNTHROID ) 112 MCG tablet Take 1 tablet (112 mcg total) by mouth daily at 6 (six) AM. Patient taking differently: Take 112 mcg by mouth every morning. 01/27/20  Yes Sebastian Toribio GAILS, MD  loperamide (IMODIUM A-D) 2 MG tablet Take 2 mg by mouth every 8 (eight) hours as needed for diarrhea or loose stools.   Yes [provider]  polyethylene glycol (MIRALAX  / GLYCOLAX ) 17 g packet Take 17 g by mouth daily as needed for mild constipation, moderate constipation or severe constipation.   Yes [provider]    Physical Exam: BP (!) 167/81   Pulse 97   Temp 98.5 F (36.9 C) (Oral)   Resp 16   Ht 5' 4 (1.626 m)   Wt 47.6 kg   SpO2 96%   BMI 18.01 kg/m  General: Thin, elderly and emaciated appearing female appears her stated age.  She is asleep with her mouth open on my arrival, with verbal and tactile stimulus, she opens her eyes and tracks.  However does not follow commands, does not respond to  any questions. Neck: supple, with significant swelling in the right lower mandibular area, no crepitus, is tender to palpation.  No oropharyngeal swelling. Cardiovascular: RRR, no murmurs or rubs, no peripheral edema  Respiratory: clear to auscultation bilaterally, no wheezes, no crackles  Abdomen: soft, nontender, nondistended  Skin: dry, no rashes  Musculoskeletal: no joint effusions, normal range of motion           Labs on Admission:  Basic Metabolic Panel: Recent Labs  Lab 05/15/23 1159  NA 147*  K 5.2*  CL 107  CO2 24  GLUCOSE 119*  BUN 56*  CREATININE 1.78*  CALCIUM 9.4   Liver Function Tests: Recent Labs  Lab 05/15/23 1159  AST 17  ALT 8  ALKPHOS 55  BILITOT 1.1  PROT 7.8   ALBUMIN 3.3*   No results for input(s): LIPASE, AMYLASE in the last 168 hours. No results for input(s): AMMONIA in the last 168 hours. CBC: Recent Labs  Lab 05/15/23 1159  WBC 10.4  NEUTROABS 9.2*  HGB 11.6*  HCT 39.2  MCV 80.5  PLT 422*   Cardiac Enzymes: No results for input(s): CKTOTAL, CKMB, CKMBINDEX, TROPONINI in the last 168 hours. BNP (last 3 results) No results for input(s): BNP in the last 8760 hours.  ProBNP (last 3 results) No results for input(s): PROBNP in the last 8760 hours.  CBG: No results for input(s): GLUCAP in the last 168 hours.  Radiological Exams on Admission: CT Head Wo Contrast Result Date: 05/15/2023 CLINICAL DATA:  Polytrauma, blunt; Facial trauma, blunt EXAM: CT HEAD WITHOUT CONTRAST CT MAXILLOFACIAL WITHOUT CONTRAST CT CERVICAL SPINE WITHOUT CONTRAST TECHNIQUE: Multidetector CT imaging of the head, cervical spine, and maxillofacial structures were performed using the standard protocol without intravenous contrast. Multiplanar CT image reconstructions of the cervical spine and maxillofacial structures were also generated. RADIATION DOSE REDUCTION: This exam was performed according to the departmental dose-optimization program which includes automated exposure control, adjustment of the mA and/or kV according to patient size and/or use of iterative reconstruction technique. COMPARISON:  CT Head and C Spine 01/14/22 FINDINGS: CT HEAD FINDINGS Brain: No hemorrhage. No hydrocephalus no extra-axial fluid collection. No mass effect. No mass lesion. 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 Skull: Normal. Negative for fracture or focal lesion. Other: None. CT MAXILLOFACIAL FINDINGS Osseous: No fracture or mandibular dislocation. No destructive process. Orbits: Negative. No traumatic or inflammatory finding. Bilateral lens  replacement. Sinuses: No middle ear or mastoid effusion. Paranasal sinuses are clear. Soft tissues: There is asymmetric soft tissue swelling around the right parotid gland, right submandibular gland, and in the subcutaneous soft tissues of the right face. Findings are worrisome for a sialoadenitis. No evidence of a sialolith. Consider further evaluation with dedicated contrast-enhanced neck CT for more definitive characterization CT CERVICAL SPINE FINDINGS Alignment: Grade 1 anterolisthesis of C2 on C3, C3 on C4, and C7 on T1 Skull base and vertebrae: No acute fracture. No primary bone lesion or focal pathologic process. Soft tissues and spinal canal: No prevertebral fluid or swelling. No visible canal hematoma. Disc levels:  No CT evidence of high-grade spinal canal stenosis. Upper chest: Negative. Other: Redemonstrated is chronic osseous remodeling around the right glenohumeral joint with thinning of the distal right clavicle IMPRESSION: 1. No CT evidence of intracranial injury. 2. No acute facial bone fracture. 3. No acute fracture or traumatic subluxation of the cervical spine. 4.  Asymmetric soft tissue swelling around the right parotid gland, right submandibular gland, and in the subcutaneous soft tissues of the right face. Findings are worrisome for a sialoadenitis. No evidence of a sialolith. Consider further evaluation with dedicated contrast-enhanced neck CT for further evaluation. 5. 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 Electronically Signed   By: Lyndall Gore M.D.   On: 05/15/2023 12:51   CT Cervical Spine Wo Contrast Result Date: 05/15/2023 CLINICAL DATA:  Polytrauma, blunt; Facial trauma, blunt EXAM: CT HEAD WITHOUT CONTRAST CT MAXILLOFACIAL WITHOUT CONTRAST CT CERVICAL SPINE WITHOUT CONTRAST TECHNIQUE: Multidetector CT imaging of the head, cervical spine, and maxillofacial structures were performed using the standard protocol  without intravenous contrast. Multiplanar CT image reconstructions of the cervical spine and maxillofacial structures were also generated. RADIATION DOSE REDUCTION: This exam was performed according to the departmental dose-optimization program which includes automated exposure control, adjustment of the mA and/or kV according to patient size and/or use of iterative reconstruction technique. COMPARISON:  CT Head and C Spine 01/14/22 FINDINGS: CT HEAD FINDINGS Brain: No hemorrhage. No hydrocephalus no extra-axial fluid collection. No mass effect. No mass lesion. 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 Skull: Normal. Negative for fracture or focal lesion. Other: None. CT MAXILLOFACIAL FINDINGS Osseous: No fracture or mandibular dislocation. No destructive process. Orbits: Negative. No traumatic or inflammatory finding. Bilateral lens replacement. Sinuses: No middle ear or mastoid effusion. Paranasal sinuses are clear. Soft tissues: There is asymmetric soft tissue swelling around the right parotid gland, right submandibular gland, and in the subcutaneous soft tissues of the right face. Findings are worrisome for a sialoadenitis. No evidence of a sialolith. Consider further evaluation with dedicated contrast-enhanced neck CT for more definitive characterization CT CERVICAL SPINE FINDINGS Alignment: Grade 1 anterolisthesis of C2 on C3, C3 on C4, and C7 on T1 Skull base and vertebrae: No acute fracture. No primary bone lesion or focal pathologic process. Soft tissues and spinal canal: No prevertebral fluid or swelling. No visible canal hematoma. Disc levels:  No CT evidence of high-grade spinal canal stenosis. Upper chest: Negative. Other: Redemonstrated is chronic osseous remodeling around the right glenohumeral joint with thinning of the distal right clavicle IMPRESSION: 1. No CT evidence of intracranial  injury. 2. No acute facial bone fracture. 3. No acute fracture or traumatic subluxation of the cervical spine. 4. Asymmetric soft tissue swelling around the right parotid gland, right submandibular gland, and in the subcutaneous soft tissues of the right face. Findings are worrisome for a sialoadenitis. No evidence of a sialolith. Consider further evaluation with dedicated contrast-enhanced neck CT for further evaluation. 5. 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 Electronically Signed   By: Lyndall Gore M.D.   On: 05/15/2023 12:51   CT Maxillofacial WO CM Result Date: 05/15/2023 CLINICAL DATA:  Polytrauma, blunt; Facial trauma, blunt EXAM: CT HEAD WITHOUT CONTRAST CT MAXILLOFACIAL WITHOUT CONTRAST CT CERVICAL SPINE WITHOUT CONTRAST TECHNIQUE: Multidetector CT imaging of the head, cervical spine, and maxillofacial structures were performed using the standard protocol without intravenous contrast. Multiplanar CT image reconstructions of the cervical spine and maxillofacial structures were also generated. RADIATION DOSE REDUCTION: This exam was performed according to the departmental dose-optimization program which includes automated exposure control, adjustment of the mA and/or kV according  to patient size and/or use of iterative reconstruction technique. COMPARISON:  CT Head and C Spine 01/14/22 FINDINGS: CT HEAD FINDINGS Brain: No hemorrhage. No hydrocephalus no extra-axial fluid collection. No mass effect. No mass lesion. 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 Skull: Normal. Negative for fracture or focal lesion. Other: None. CT MAXILLOFACIAL FINDINGS Osseous: No fracture or mandibular dislocation. No destructive process. Orbits: Negative. No traumatic or inflammatory finding. Bilateral lens replacement. Sinuses: No middle ear or  mastoid effusion. Paranasal sinuses are clear. Soft tissues: There is asymmetric soft tissue swelling around the right parotid gland, right submandibular gland, and in the subcutaneous soft tissues of the right face. Findings are worrisome for a sialoadenitis. No evidence of a sialolith. Consider further evaluation with dedicated contrast-enhanced neck CT for more definitive characterization CT CERVICAL SPINE FINDINGS Alignment: Grade 1 anterolisthesis of C2 on C3, C3 on C4, and C7 on T1 Skull base and vertebrae: No acute fracture. No primary bone lesion or focal pathologic process. Soft tissues and spinal canal: No prevertebral fluid or swelling. No visible canal hematoma. Disc levels:  No CT evidence of high-grade spinal canal stenosis. Upper chest: Negative. Other: Redemonstrated is chronic osseous remodeling around the right glenohumeral joint with thinning of the distal right clavicle IMPRESSION: 1. No CT evidence of intracranial injury. 2. No acute facial bone fracture. 3. No acute fracture or traumatic subluxation of the cervical spine. 4. Asymmetric soft tissue swelling around the right parotid gland, right submandibular gland, and in the subcutaneous soft tissues of the right face. Findings are worrisome for a sialoadenitis. No evidence of a sialolith. Consider further evaluation with dedicated contrast-enhanced neck CT for further evaluation. 5. 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 Electronically Signed   By: Lyndall Gore M.D.   On: 05/15/2023 12:51   DG Pelvis 1-2 Views Result Date: 05/15/2023 CLINICAL DATA:  89 year old female status post fall. EXAM: PELVIS - 1-2 VIEW COMPARISON:  CT Abdomen and Pelvis 01/03/2020. Pelvis radiograph 11/06/2018. FINDINGS: Portable AP supine view at 1216 hours. Mildly oblique to the right. Femoral heads remain normally located. Osteopenia. Limited pelvic bone detail also due to rectal retained stool. No  pelvis fracture identified. Aortoiliac calcified atherosclerosis. Grossly intact proximal femurs. Nonobstructed bowel-gas pattern. IMPRESSION: No acute fracture or dislocation identified about the pelvis. If there is lateralizing hip pain then dedicated hip series is recommended. Electronically Signed   By: VEAR Hurst M.D.   On: 05/15/2023 12:40   Assessment/Plan Bethany Blake is a 89 y.o. female with medical history significant for CKD, hypertension, advanced dementia being admitted to the hospital with sialoadenitis.  Sialoadenitis-without evidence of complication such as abscess, stone, or sepsis. -Inpatient admission to MedSurg - Discussed with pharmacy, will treat empirically with IV Unasyn  -Pain control with Tylenol  as needed -Discussed with Dr. Roark ENT, who recommends ideally IV antibiotics, intermittent gland massage, heat and NSAIDs.  However this will be limited by the patient's advanced dementia as well as her AKI. -Will order intermittent warm compress  Hypernatremia-I suspect this is due to severe dehydration in the setting of acute infection  AKI-with elevated creatinine above baseline, presumably due to significant dehydration -Will hydrate gently with normal saline, monitor renal function closely  Thrombocytosis-likely reactive due to her acute infection  Hypothyroidism-continue home Synthroid  dose  DVT prophylaxis: Heparin  subcu  Code Status: Limited: Do not attempt resuscitation (DNR) -DNR-LIMITED -Do Not Intubate/DNI   Consults called: None  Admission status: The appropriate patient status for this patient is INPATIENT. Inpatient status is judged to be reasonable and necessary in order to provide the required intensity of service to ensure the patient's safety. The patient's presenting symptoms, physical exam findings, and initial radiographic and laboratory data in the context of their chronic comorbidities is felt to place them at high risk for further clinical  deterioration. Furthermore, it is not anticipated that the patient will be medically stable for discharge from the hospital within 2 midnights of admission.    I certify that at the point of admission it is my clinical judgment that the patient will require inpatient hospital care spanning beyond 2 midnights from the point of admission due to high intensity of service, high risk for further deterioration and high frequency of surveillance required  Time spent: 59 minutes  Bethany Blake CHRISTELLA Gail MD [facility] Pager [phone]  If 7PM-7AM, please contact night-coverage [email or web address] Password [email or web address]  05/15/2023, 2:26 PM

## 2023-05-15 NOTE — ED Triage Notes (Addendum)
Patient BIB EMS from [facility]. Reports right sided face swelling starting this AM. Has some throat swelling.

## 2023-05-15 NOTE — ED Provider Notes (Signed)
 [facility] AT [facility] Provider Note   CSN: [id number] Arrival date & time: 05/15/23  1102     History  Chief Complaint  Patient presents with   Facial Swelling    Bethany Blake is a 89 y.o. female.  HPI Elderly female presents from nursing facility with concern for fall.  I discussed the patient's case on arrival with EMS, as the patient is not on verbal, have dementia.  Reportedly patient also has history of frequent falls.  Today patient was found with new facial swelling on the right side.  No witnessed fall, but with concern for this, patient sent here for evaluation.  No nursing home report of change in interactivity, speech patterns, vital sign abnormalities.    Home Medications Prior to Admission medications   Medication Sig Start Date End Date Taking? Authorizing Provider  acetaminophen  (TYLENOL ) 500 MG tablet Take 500 mg by mouth daily as needed (pain).    [provider]  cholecalciferol  (VITAMIN D3) 25 MCG (1000 UNIT) tablet Take 1,000 Units by mouth daily with lunch.    [provider]  dexamethasone  (DECADRON ) 6 MG tablet Take 1 tablet (6 mg total) by mouth daily. 01/23/22   Samtani, Jai-Gurmukh, MD  guaiFENesin -dextromethorphan (ROBITUSSIN DM) 100-10 MG/5ML syrup Take 10 mLs by mouth every 4 (four) hours as needed for cough. 01/22/22   Samtani, Jai-Gurmukh, MD  levothyroxine  (SYNTHROID ) 112 MCG tablet Take 1 tablet (112 mcg total) by mouth daily at 6 (six) AM. Patient taking differently: Take 112 mcg by mouth every morning. 01/27/20   Sebastian Toribio GAILS, MD      Allergies    Ace inhibitors, Aldactone [spironolactone], Angiotensin receptor blockers, and Sulfa antibiotics    Review of Systems   Review of Systems  Physical Exam Updated Vital Signs BP (!) 167/81   Pulse 97   Temp 98.5 F (36.9 C) (Oral)   Resp 16   Ht 5' 4 (1.626 m)   Wt 47.6 kg   SpO2 96%   BMI 18.01 kg/m  Physical Exam Vitals and  nursing note reviewed.  Constitutional:      General: She is not in acute distress.    Appearance: She is well-developed. She is ill-appearing.  HENT:     Head: Normocephalic.   Eyes:     Conjunctiva/sclera: Conjunctivae normal.  Cardiovascular:     Rate and Rhythm: Normal rate and regular rhythm.  Pulmonary:     Effort: Pulmonary effort is normal. No respiratory distress.     Breath sounds: Normal breath sounds. No stridor.  Abdominal:     General: There is no distension.  Skin:    General: Skin is warm and dry.  Neurological:     Mental Status: She is alert.     Motor: Atrophy present.     Comments: Does not follow commands reliably, is nonverbal, seemingly responds to some direct questions, though with amplified voice.  Psychiatric:        Cognition and Memory: Cognition is impaired. Memory is impaired.     ED Results / Procedures / Treatments   Labs (all labs ordered are listed, but only abnormal results are displayed) Labs Reviewed  CBC WITH DIFFERENTIAL/PLATELET - Abnormal; Notable for the following components:      Result Value   Hemoglobin 11.6 (*)    MCH 23.8 (*)    MCHC 29.6 (*)    RDW 17.8 (*)    Platelets 422 (*)    Neutro Abs  9.2 (*)    All other components within normal limits  COMPREHENSIVE METABOLIC PANEL - Abnormal; Notable for the following components:   Sodium 147 (*)    Potassium 5.2 (*)    Glucose, Bld 119 (*)    BUN 56 (*)    Creatinine, Ser 1.78 (*)    Albumin 3.3 (*)    GFR, Estimated 25 (*)    Anion gap 16 (*)    All other components within normal limits    EKG None  Radiology DG Pelvis 1-2 Views Result Date: 05/15/2023 CLINICAL DATA:  89 year old female status post fall. EXAM: PELVIS - 1-2 VIEW COMPARISON:  CT Abdomen and Pelvis 01/03/2020. Pelvis radiograph 11/06/2018. FINDINGS: Portable AP supine view at 1216 hours. Mildly oblique to the right. Femoral heads remain normally located. Osteopenia. Limited pelvic bone detail also due to  rectal retained stool. No pelvis fracture identified. Aortoiliac calcified atherosclerosis. Grossly intact proximal femurs. Nonobstructed bowel-gas pattern. IMPRESSION: No acute fracture or dislocation identified about the pelvis. If there is lateralizing hip pain then dedicated hip series is recommended. Electronically Signed   By: VEAR Hurst M.D.   On: 05/15/2023 12:40    Procedures Procedures    Medications Ordered in ED Medications  sodium chloride  0.9 % bolus 1,000 mL (has no administration in time range)  nafcillin  1 g in sodium chloride  0.9 % 50 mL IVPB (has no administration in time range)    ED Course/ Medical Decision Making/ A&P                                 Medical Decision Making Elderly female presents from nursing facility with concern for possible fall given that she was found by staff with swelling. No obvious trauma, but facial asymmetry is noted.  CTs, x-rays ordered.  Amount and/or Complexity of Data Reviewed Independent Historian: EMS External Data Reviewed: notes. Labs: ordered. Decision-making details documented in ED Course. Radiology: ordered and independent interpretation performed. Decision-making details documented in ED Course.  Risk Decision regarding hospitalization. Diagnosis or treatment significantly limited by social determinants of health.   1:50 PM While at bedside I reviewed the patient's chart, discussed her findings with the patient's son via telephone.  We reviewed the patient's CT scans, findings consistent with sialoadenitis.  With labs consistent with acute kidney injury, patient has received fluid resuscitation, antibiotics, will require admission.  I discussed goals of care explicitly with the patient's son.   Notes that the patient has not been eating and drinking normally over the past days.  He confirmed the patient is DNR, DNI, but he and family members prefer a trial of therapy to see if the patient's condition improves with  fluids, antibiotics.  He is currently in [place] , his sister is in [place] .  He is going to try to come here in the next day.        Final Clinical Impression(s) / ED Diagnoses Final diagnoses:  AKI (acute kidney injury) (HCC)  Sialoadenitis    Rx / DC Orders ED Discharge Orders     None         Garrick Charleston, MD 05/15/23 1352

## 2023-05-16 ENCOUNTER — Inpatient Hospital Stay (HOSPITAL_COMMUNITY): Payer: Medicare Other

## 2023-05-16 DIAGNOSIS — K1120 Sialoadenitis, unspecified: Secondary | ICD-10-CM | POA: Diagnosis not present

## 2023-05-16 LAB — BASIC METABOLIC PANEL
Anion gap: 14 (ref 5–15)
BUN: 49 mg/dL — ABNORMAL HIGH (ref 8–23)
CO2: 19 mmol/L — ABNORMAL LOW (ref 22–32)
Calcium: 8.5 mg/dL — ABNORMAL LOW (ref 8.9–10.3)
Chloride: 117 mmol/L — ABNORMAL HIGH (ref 98–111)
Creatinine, Ser: 1.53 mg/dL — ABNORMAL HIGH (ref 0.44–1.00)
GFR, Estimated: 30 mL/min — ABNORMAL LOW (ref >60)
Glucose, Bld: 103 mg/dL — ABNORMAL HIGH (ref 70–99)
Potassium: 4 mmol/L (ref 3.5–5.1)
Sodium: 150 mmol/L — ABNORMAL HIGH (ref 135–145)

## 2023-05-16 LAB — CBC
HCT: 32.8 % — ABNORMAL LOW (ref 36.0–46.0)
Hemoglobin: 9.4 g/dL — ABNORMAL LOW (ref 12.0–15.0)
MCH: 23.7 pg — ABNORMAL LOW (ref 26.0–34.0)
MCHC: 28.7 g/dL — ABNORMAL LOW (ref 30.0–36.0)
MCV: 82.8 fL (ref 80.0–100.0)
Platelets: 347 10*3/uL (ref 150–400)
RBC: 3.96 MIL/uL (ref 3.87–5.11)
RDW: 18.1 % — ABNORMAL HIGH (ref 11.5–15.5)
WBC: 14.5 10*3/uL — ABNORMAL HIGH (ref 4.0–10.5)
nRBC: 0 % (ref 0.0–0.2)

## 2023-05-16 MED ORDER — ORAL CARE MOUTH RINSE: 15.0000 mL | OROMUCOSAL | Status: DC | PRN

## 2023-05-16 MED ORDER — DEXTROSE 5 % IV SOLN
INTRAVENOUS | Status: DC
Start: 2023-05-16 — End: 2023-05-16

## 2023-05-16 MED ORDER — LINEZOLID 600 MG/300ML IV SOLN
600.0000 mg | Freq: Two times a day (BID) | INTRAVENOUS | Status: DC
  Administered 2023-05-16 – 2023-05-23 (×15): 600 mg via INTRAVENOUS
  Filled 2023-05-16 (×15): qty 300

## 2023-05-16 MED ORDER — SODIUM CHLORIDE 0.45 % IV SOLN: INTRAVENOUS | Status: AC

## 2023-05-16 MED ORDER — THIAMINE HCL 100 MG/ML IJ SOLN
500.0000 mg | INTRAVENOUS | Status: DC
  Administered 2023-05-16 – 2023-05-22 (×7): 500 mg via INTRAVENOUS
  Filled 2023-05-16 (×6): qty 5
  Filled 2023-05-16: qty 500
  Filled 2023-05-16: qty 5

## 2023-05-16 NOTE — Evaluation (Signed)
 Clinical/Bedside Swallow Evaluation Patient Details  Name: ARLISHA PATALANO MRN: [id number] Date of Birth: [birth year]  Today's Date: 05/16/2023 Time: SLP Start Time (ACUTE ONLY): 1020 SLP Stop Time (ACUTE ONLY): 1040 SLP Time Calculation (min) (ACUTE ONLY): 20 min  Past Medical History:  Past Medical History:  Diagnosis Date   Anxiety    Arthritis    Chronic kidney disease    Hypertension    Hypokalemia    Hyponatremia    Hypothyroidism    Past Surgical History:  Past Surgical History:  Procedure Laterality Date   SHOULDER SURGERY     WRIST SURGERY     HPI:  MYLINH CRAGG is a 89 y.o. female with medical history significant for CKD, hypertension, advanced dementia being admitted to the hospital with sialoadenitis.  She is somewhat limited as the patient is nonverbal, she is a resident of [facility] nursing facility.  Her son and daughter both live out of state, ER provider spoke with her son over the phone.  Workup as detailed below shows evidence of dehydration, hypernatremia, AKI, and sialoadenitis.  Patient has been started on IV antibiotics, given IV fluids and admitted to the hospitalist service.    Assessment / Plan / Recommendation  Clinical Impression  Patient presents with evidence of a severe pharyngeal phase dysphagia characterized by gross right sided facial and neck swelling reaching around past midline and up to level of under ear lobe with reported pain on palation. Suspect that this edema is also located interiorlly and impacting hyolaryngeal movement and airway closure. Patient with immediate and consistent multiple swallows per bite/sip, wet vocal quality, immediate throat clearing and eventual subtle cough response indicative of decreased airway protection. Baseline mild dysphagia likely exacerbated by acute sialoadenitis. Recommend instrumental study to evalulate swallowing physiology and confirm bedside findings as well as determine POC and if any pos could be safe for  patient today. Will plan for MBS this pm. SLP Visit Diagnosis: Dysphagia, pharyngeal phase (R13.13)    Aspiration Risk  Severe aspiration risk    Diet Recommendation NPO    Medication Administration: Via alternative means    Other  Recommendations Oral Care Recommendations: Oral care QID    Recommendations for follow up therapy are one component of a multi-disciplinary discharge planning process, led by the attending physician.  Recommendations may be updated based on patient status, additional functional criteria and insurance authorization.  Follow up Recommendations        Assistance Recommended at Discharge    Functional Status Assessment    Frequency and Duration            Prognosis        Swallow Study   General HPI: EMALIA WITKOP is a 89 y.o. female with medical history significant for CKD, hypertension, advanced dementia being admitted to the hospital with sialoadenitis.  She is somewhat limited as the patient is nonverbal, she is a resident of [facility] nursing facility.  Her son and daughter both live out of state, ER provider spoke with her son over the phone.  Workup as detailed below shows evidence of dehydration, hypernatremia, AKI, and sialoadenitis.  Patient has been started on IV antibiotics, given IV fluids and admitted to the hospitalist service. Type of Study: Bedside Swallow Evaluation Previous Swallow Assessment: 01/17/22-MBS recommended dysphagia 2, thin liquids with strict precautions given subtle aspiration of thin liquids, no straws, meds whole in puree Diet Prior to this Study: NPO Temperature Spikes Noted: No Respiratory Status: Room air History  of Recent Intubation: No Behavior/Cognition: Alert;Cooperative;Pleasant mood Oral Cavity Assessment: Dry (possible lingual edema) Oral Care Completed by SLP: Yes Oral Cavity - Dentition: Dentures, not available;Dentures, bottom Vision: Functional for self-feeding Self-Feeding Abilities: Needs  assist Patient Positioning: Upright in bed Baseline Vocal Quality: Low vocal intensity Volitional Cough: Weak Volitional Swallow: Unable to elicit    Oral/Motor/Sensory Function Overall Oral Motor/Sensory Function: Generalized oral weakness   Ice Chips Ice chips: Impaired Presentation: Spoon Pharyngeal Phase Impairments: Decreased hyoid-laryngeal movement;Multiple swallows;Wet Vocal Quality;Throat Clearing - Immediate;Cough - Delayed   Thin Liquid Thin Liquid: Impaired Presentation: Spoon Pharyngeal  Phase Impairments: Decreased hyoid-laryngeal movement;Multiple swallows;Wet Vocal Quality;Throat Clearing - Immediate;Cough - Delayed    Nectar Thick Nectar Thick Liquid: Not tested   Honey Thick Honey Thick Liquid: Not tested   Puree Puree: Not tested   Solid     Solid: Not tested     Johsua Shevlin MA, CCC-SLP  Joeann Steppe Meryl 05/16/2023,10:47 AM

## 2023-05-16 NOTE — Progress Notes (Addendum)
 PROGRESS NOTE    Bethany Blake  FMW:[id number] DOB: Jun 20, 1921 DOA: 05/15/2023 PCP: Onita Rush, MD   Brief Narrative: 89 year old with past medical history significant for CKD, hypertension, advanced dementia admitted to the hospital with sialoadenitis.  History is somewhat limited due to patient being nonverbal, resident of [facility] nursing facility.  Family lives out of state, ER provider spoke with her son over the phone.  Patient presented with dehydration, hyponatremia, AKI and sialoadenitis.     Assessment & Plan:   Principal Problem:   Sialoadenitis  1-Sialoadenitis.  CT head, neck: Asymmetric soft tissue swelling around the right parotid gland,right submandibular gland, and in the subcutaneous soft tissues of the right face. Findings are worrisome for a sialoadenitis. No evidence of a sialolith -Will add linezolid  to cover for MRSA, continue with Unasyn  -Discussed with Dr. Roark, we could try IV steroid.  Unable to use NSAID.  Continue with intermittent gland massage and heat. -If no improvement in several days with IV antibiotics and MRSA coverage, repeat CT and discussed further with ENT   2-Hypernatremia: In the setting of dehydration: Continue with IV fluids  AKI, CKD stage IIIa: Cr peak to 1.7 In the setting of dehydration continue with IV fluids  Hypothyroidism: Continue with Synthroid   Thrombocytosis likely reactive in the setting of acute infection  Nutrition;  Dysphagia; Speech recommend NPO.  Follow recommendations.  MBS: moderate oral dysphagia with impaired lingual transiting of boluses and decr labial closure with resultant piecemealing and retention. Severe pharyngeal dysphagia c/b severely impaired pharyngeal motility due to edema from current illness.   Dementia;  -per son, she can carry a conversation, recognized family, she has underline dementia.    Estimated body mass index is 18.01 kg/m as calculated from the following:   Height as of this  encounter: 5' 4 (1.626 m).   Weight as of this encounter: 47.6 kg.   DVT prophylaxis: heparin   Code Status: DNR Family Communication: Son  Disposition Plan:  Status is: Inpatient Remains inpatient appropriate because: management of sialoadenitis.     Consultants:  Dr Roark. Discussed case  Procedures:  none  Antimicrobials:    Subjective: Alert, asking for water. Difficult to understand/.   Objective: Vitals:   05/15/23 2111 05/16/23 0008 05/16/23 0727 05/16/23 1222  BP: (!) 155/111 (!) 150/112 (!) 144/89 (!) 150/67  Pulse: 87 86 91 88  Resp: 18 (!) 22 17 18   Temp: 98.3 F (36.8 C) 97.9 F (36.6 C) 98.5 F (36.9 C) 97.7 F (36.5 C)  TempSrc: Oral Axillary Oral Oral  SpO2: 95% 95% 94% 94%  Weight:      Height:        Intake/Output Summary (Last 24 hours) at 05/16/2023 1645 Last data filed at 05/16/2023 0500 Gross per 24 hour  Intake 1012.53 ml  Output 400 ml  Net 612.53 ml   Filed Weights   05/15/23 1110  Weight: 47.6 kg    Examination:  General exam: Appears calm and comfortable  Respiratory system: Clear to auscultation. Respiratory effort normal. Cardiovascular system: S1 & S2 heard, RRR. No JVD, murmurs, rubs, gallops or clicks. No pedal edema. Gastrointestinal system: Abdomen is nondistended, soft and nontender. No organomegaly or masses felt. Normal bowel sounds heard. Central nervous system: Alert and oriented. No focal neurological deficits. Extremities: Symmetric 5 x 5 power. Skin: No rashes, lesions or ulcers Psychiatry: Judgement and insight appear normal. Mood & affect appropriate.     Data Reviewed: I have personally reviewed following labs and  imaging studies  CBC: Recent Labs  Lab 05/15/23 1159 05/16/23 0439  WBC 10.4 14.5*  NEUTROABS 9.2*  --   HGB 11.6* 9.4*  HCT 39.2 32.8*  MCV 80.5 82.8  PLT 422* 347   Basic Metabolic Panel: Recent Labs  Lab 05/15/23 1159 05/15/23 2055 05/16/23 0439  NA 147* 149* 150*  K 5.2* 4.2  4.0  CL 107 114* 117*  CO2 24 20* 19*  GLUCOSE 119* 115* 103*  BUN 56* 50* 49*  CREATININE 1.78* 1.58* 1.53*  CALCIUM 9.4 8.4* 8.5*   GFR: Estimated Creatinine Clearance: 15.1 mL/min (A) (by C-G formula based on SCr of 1.53 mg/dL (H)). Liver Function Tests: Recent Labs  Lab 05/15/23 1159  AST 17  ALT 8  ALKPHOS 55  BILITOT 1.1  PROT 7.8  ALBUMIN 3.3*   No results for input(s): LIPASE, AMYLASE in the last 168 hours. No results for input(s): AMMONIA in the last 168 hours. Coagulation Profile: No results for input(s): INR, PROTIME in the last 168 hours. Cardiac Enzymes: No results for input(s): CKTOTAL, CKMB, CKMBINDEX, TROPONINI in the last 168 hours. BNP (last 3 results) No results for input(s): PROBNP in the last 8760 hours. HbA1C: No results for input(s): HGBA1C in the last 72 hours. CBG: No results for input(s): GLUCAP in the last 168 hours. Lipid Profile: No results for input(s): CHOL, HDL, LDLCALC, TRIG, CHOLHDL, LDLDIRECT in the last 72 hours. Thyroid  Function Tests: No results for input(s): TSH, T4TOTAL, FREET4, T3FREE, THYROIDAB in the last 72 hours. Anemia Panel: No results for input(s): VITAMINB12, FOLATE, FERRITIN, TIBC, IRON, RETICCTPCT in the last 72 hours. Sepsis Labs: No results for input(s): PROCALCITON, LATICACIDVEN in the last 168 hours.  No results found for this or any previous visit (from the past 240 hours).       Radiology Studies: DG Swallowing Func-Speech Pathology Result Date: 05/16/2023 Table formatting from the original result was not included. Modified Barium Swallow Study Patient Details Name: Bethany Blake MRN: [id number] Date of Birth: 04-30-1921 Today's Date: 05/16/2023 HPI/PMH: HPI: Per MD note [name] is a 89 y.o. female with medical history significant for CKD, hypertension, advanced dementia being admitted to the hospital with sialoadenitis and fall .  She is somewhat  limited as the patient is nonverbal, she is a resident of [facility] nursing facility.  Her son and daughter both live out of state, ER provider spoke with her son over the phone.  Workup as detailed below shows evidence of dehydration, hypernatremia, AKI, and sialoadenitis.  Patient has been started on IV antibiotics, given IV fluids and admitted to the hospitalist service.  Patient has prior medical history of dysphagia diagnosed October 2023 with recommendations of dysphagia 2 and thin liquid at that time with strict precautions.  Past medical history also includes COVID.  MD was in touch with Dr. Roark ENT who recommended IV antibiotics, intermittent gland massage, heat and NSAIDs. Clinical Impression: Clinical Impression: Limited assessment secondary to pt's edema and level of dysphagia - and view limited by pt's right shoulder being elevated to near ear impairing view.    Pt demonstrates moderate oral dysphagia with impaired lingual transiting of boluses and decr labial closure with resultant piecemealing and retention.  Severe pharyngeal dysphagia c/b severely impaired pharyngeal motility due to edema from current illness.  Pharyngeal contraction, tongue base retraction and laryngeal closure impaired allowing severe retention (mixing with severe secretions) and laryngeal penetration/trace aspiration.   Pt has some awareness/sensation to pharyngeal impairment as she  conducts dry swallows without cues. She also largely senses severe frank laryngeal penetration as she reflexively coughs and attempts to expectorate.  Comparing imaging from prior MBS from 2023 reveals severe edema currently. Pt did not conduct head turn as SLP requested to determine if may decrease retention. At this time, recommend NPO with aggressive oral care and frequent oral moisture - tsps of thin water after mouth care and encouraging pt to cough and expectorate as able.  Will follow up for readiness for intake - hopefully as edema  dissipates.  Of note, SLP provided oral suctioning following exam and removed viscous secretions coated with barium - frequent oral care is vital for this pt - RN informed. Factors that may increase risk of adverse event in presence of aspiration [name] 2021): Factors that may increase risk of adverse event in presence of aspiration [name] 2021): Reduced cognitive function; Poor general health and/or compromised immunity; Reduced saliva; Inadequate oral hygiene; Frail or deconditioned; Limited mobility Recommendations/Plan: Swallowing Evaluation Recommendations Swallowing Evaluation Recommendations Recommendations: -- (tsps of thin water after mouth care) Liquid Administration via: Spoon Medication Administration: Via alternative means Supervision: Full supervision/cueing for swallowing strategies Swallowing strategies  : Slow rate; Small bites/sips Postural changes: Position pt fully upright for meals; Stay upright 30-60 min after meals Oral care recommendations: Oral care QID (4x/day) Caregiver Recommendations: Have oral suction available Treatment Plan Treatment Plan Treatment recommendations: Therapy as outlined in treatment plan below Follow-up recommendations: Skilled nursing-short term rehab (<3 hours/day) Functional status assessment: Patient has had a recent decline in their functional status and demonstrates the ability to make significant improvements in function in a reasonable and predictable amount of time. Treatment frequency: Min 1x/week Treatment duration: 1 week Interventions: Aspiration precaution training; Other (comment); Trials of upgraded texture/liquids; Patient/family education; Compensatory techniques Recommendations Recommendations for follow up therapy are one component of a multi-disciplinary discharge planning process, led by the attending physician.  Recommendations may be updated based on patient status, additional functional criteria and insurance authorization.  Assessment: Orofacial Exam: Orofacial Exam Oral Cavity: Oral Hygiene: Xerostomia; Other (comment); Dried secretions Oral Cavity - Dentition: Other (Comment); Dentures, not available Orofacial Anatomy: Other (comment) Oral Motor/Sensory Function: Other (comment) (pt with difficulty sealing her lips - lingual protrusion) Anatomy: No data recorded Boluses Administered: Boluses Administered Boluses Administered: Thin liquids (Level 0); Mildly thick liquids (Level 2, nectar thick)  Oral Impairment Domain: Oral Impairment Domain Lip Closure: Escape progressing to mid-chin Tongue control during bolus hold: Posterior escape of greater than half of bolus Bolus transport/lingual motion: Delayed initiation of tongue motion (oral holding); Slow tongue motion Oral residue: Residue collection on oral structures Location of oral residue : Tongue Initiation of pharyngeal swallow : Pyriform sinuses  Pharyngeal Impairment Domain: Pharyngeal Impairment Domain Soft palate elevation: No bolus between soft palate (SP)/pharyngeal wall (PW) Laryngeal elevation: Partial superior movement of thyroid  cartilage/partial approximation of arytenoids to epiglottic petiole Anterior hyoid excursion: Partial anterior movement Epiglottic movement: Partial inversion Laryngeal vestibule closure: Incomplete, narrow column air/contrast in laryngeal vestibule Pharyngeal stripping wave : Present - diminished Pharyngeal contraction (A/P view only): N/A Pharyngoesophageal segment opening: Partial distention/partial duration, partial obstruction of flow (difficult view due to pt's shoulder blocking view and snap obstructing as well) Tongue base retraction: Wide column of contrast or air between tongue base and PPW Pharyngeal residue: Majority of contrast within or on pharyngeal structures; Minimal to no pharyngeal clearance Location of pharyngeal residue: Tongue base; Pharyngeal wall; Valleculae; Diffuse (>3 areas)  Esophageal Impairment  Domain: No data  recorded Pill: Pill Consistency administered: -- (DNt) Penetration/Aspiration Scale Score: Penetration/Aspiration Scale Score 3.  Material enters airway, remains ABOVE vocal cords and not ejected out: Mildly thick liquids (Level 2, nectar thick) 8.  Material enters airway, passes BELOW cords without attempt by patient to eject out (silent aspiration) : Thin liquids (Level 0) Compensatory Strategies: Compensatory Strategies Compensatory strategies: No   General Information: Caregiver present: No  Diet Prior to this Study: NPO   Temperature : Normal   Respiratory Status: WFL   Supplemental O2: None (Room air)   History of Recent Intubation: No  Behavior/Cognition: Alert; Other (Comment) (inconsistently follows directions - at times is resistant) No data recorded Baseline vocal quality/speech: Hypophonia/low volume; Other (comment) (wet) Volitional Cough: Able to elicit Volitional Swallow: Unable to elicit Exam Limitations: Limited visibility; Poor bolus acceptance Goal Planning: Prognosis for improved oropharyngeal function: Good Barriers to Reach Goals: Time post onset No data recorded Patient/Family Stated Goal: None stated, patient is nonverbal Consulted and agree with results and recommendations: Patient; Nurse Pain: Pain Assessment Pain Assessment: Faces Faces Pain Scale: 0 Breathing: 0 Negative Vocalization: 0 Facial Expression: 0 Body Language: 0 Consolability: 0 PAINAD Score: 0 End of Session: Start Time:SLP Start Time (ACUTE ONLY): 1020 Stop Time: SLP Stop Time (ACUTE ONLY): 1040 Time Calculation:SLP Time Calculation (min) (ACUTE ONLY): 20 min Charges: SLP Evaluations $ SLP Speech Visit: 1 Visit SLP Evaluations $BSS Swallow: 1 Procedure SLP visit diagnosis: SLP Visit Diagnosis: Dysphagia, oropharyngeal phase (R13.12) Past Medical History: Past Medical History: Diagnosis Date  Anxiety   Arthritis   Chronic kidney disease   Hypertension   Hypokalemia   Hyponatremia   Hypothyroidism  Past Surgical History: Past  Surgical History: Procedure Laterality Date  SHOULDER SURGERY    WRIST SURGERY   Madelin POUR, MS [facility] SLP Acute Rehab Services Office [phone] Nicolas Emmie Caldron 05/16/2023, 2:16 PM  CT Head Wo Contrast Result Date: 05/15/2023 CLINICAL DATA:  Polytrauma, blunt; Facial trauma, blunt EXAM: CT HEAD WITHOUT CONTRAST CT MAXILLOFACIAL WITHOUT CONTRAST CT CERVICAL SPINE WITHOUT CONTRAST TECHNIQUE: Multidetector CT imaging of the head, cervical spine, and maxillofacial structures were performed using the standard protocol without intravenous contrast. Multiplanar CT image reconstructions of the cervical spine and maxillofacial structures were also generated. RADIATION DOSE REDUCTION: This exam was performed according to the departmental dose-optimization program which includes automated exposure control, adjustment of the mA and/or kV according to patient size and/or use of iterative reconstruction technique. COMPARISON:  CT Head and C Spine 01/14/22 FINDINGS: CT HEAD FINDINGS Brain: No hemorrhage. No hydrocephalus no extra-axial fluid collection. No mass effect. No mass lesion. 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 Skull: Normal. Negative for fracture or focal lesion. Other: None. CT MAXILLOFACIAL FINDINGS Osseous: No fracture or mandibular dislocation. No destructive process. Orbits: Negative. No traumatic or inflammatory finding. Bilateral lens replacement. Sinuses: No middle ear or mastoid effusion. Paranasal sinuses are clear. Soft tissues: There is asymmetric soft tissue swelling around the right parotid gland, right submandibular gland, and in the subcutaneous soft tissues of the right face. Findings are worrisome for a sialoadenitis. No evidence of a sialolith. Consider further evaluation with dedicated contrast-enhanced neck CT for more definitive characterization CT CERVICAL SPINE FINDINGS  Alignment: Grade 1 anterolisthesis of C2 on C3, C3 on C4, and C7 on T1 Skull base and vertebrae: No acute fracture. No primary bone  lesion or focal pathologic process. Soft tissues and spinal canal: No prevertebral fluid or swelling. No visible canal hematoma. Disc levels:  No CT evidence of high-grade spinal canal stenosis. Upper chest: Negative. Other: Redemonstrated is chronic osseous remodeling around the right glenohumeral joint with thinning of the distal right clavicle IMPRESSION: 1. No CT evidence of intracranial injury. 2. No acute facial bone fracture. 3. No acute fracture or traumatic subluxation of the cervical spine. 4. Asymmetric soft tissue swelling around the right parotid gland, right submandibular gland, and in the subcutaneous soft tissues of the right face. Findings are worrisome for a sialoadenitis. No evidence of a sialolith. Consider further evaluation with dedicated contrast-enhanced neck CT for further evaluation. 5. 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 Electronically Signed   By: Lyndall Gore M.D.   On: 05/15/2023 12:51   CT Cervical Spine Wo Contrast Result Date: 05/15/2023 CLINICAL DATA:  Polytrauma, blunt; Facial trauma, blunt EXAM: CT HEAD WITHOUT CONTRAST CT MAXILLOFACIAL WITHOUT CONTRAST CT CERVICAL SPINE WITHOUT CONTRAST TECHNIQUE: Multidetector CT imaging of the head, cervical spine, and maxillofacial structures were performed using the standard protocol without intravenous contrast. Multiplanar CT image reconstructions of the cervical spine and maxillofacial structures were also generated. RADIATION DOSE REDUCTION: This exam was performed according to the departmental dose-optimization program which includes automated exposure control, adjustment of the mA and/or kV according to patient size and/or use of iterative reconstruction technique. COMPARISON:  CT Head and C Spine 01/14/22 FINDINGS: CT HEAD FINDINGS  Brain: No hemorrhage. No hydrocephalus no extra-axial fluid collection. No mass effect. No mass lesion. 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 Skull: Normal. Negative for fracture or focal lesion. Other: None. CT MAXILLOFACIAL FINDINGS Osseous: No fracture or mandibular dislocation. No destructive process. Orbits: Negative. No traumatic or inflammatory finding. Bilateral lens replacement. Sinuses: No middle ear or mastoid effusion. Paranasal sinuses are clear. Soft tissues: There is asymmetric soft tissue swelling around the right parotid gland, right submandibular gland, and in the subcutaneous soft tissues of the right face. Findings are worrisome for a sialoadenitis. No evidence of a sialolith. Consider further evaluation with dedicated contrast-enhanced neck CT for more definitive characterization CT CERVICAL SPINE FINDINGS Alignment: Grade 1 anterolisthesis of C2 on C3, C3 on C4, and C7 on T1 Skull base and vertebrae: No acute fracture. No primary bone lesion or focal pathologic process. Soft tissues and spinal canal: No prevertebral fluid or swelling. No visible canal hematoma. Disc levels:  No CT evidence of high-grade spinal canal stenosis. Upper chest: Negative. Other: Redemonstrated is chronic osseous remodeling around the right glenohumeral joint with thinning of the distal right clavicle IMPRESSION: 1. No CT evidence of intracranial injury. 2. No acute facial bone fracture. 3. No acute fracture or traumatic subluxation of the cervical spine. 4. Asymmetric soft tissue swelling around the right parotid gland, right submandibular gland, and in the subcutaneous soft tissues of the right face. Findings are worrisome for a sialoadenitis. No evidence of a sialolith. Consider further evaluation with dedicated contrast-enhanced neck CT for further evaluation. 5. Redemonstrated is a superiorly  projecting aneurysm at the cavernous ICA on the left measuring 10 x 9 mm. Electronically Signed   By: Lyndall Gore M.D.   On: 05/15/2023 12:51   CT Maxillofacial WO CM Result Date: 05/15/2023 CLINICAL DATA:  Polytrauma, blunt;  Facial trauma, blunt EXAM: CT HEAD WITHOUT CONTRAST CT MAXILLOFACIAL WITHOUT CONTRAST CT CERVICAL SPINE WITHOUT CONTRAST TECHNIQUE: Multidetector CT imaging of the head, cervical spine, and maxillofacial structures were performed using the standard protocol without intravenous contrast. Multiplanar CT image reconstructions of the cervical spine and maxillofacial structures were also generated. RADIATION DOSE REDUCTION: This exam was performed according to the departmental dose-optimization program which includes automated exposure control, adjustment of the mA and/or kV according to patient size and/or use of iterative reconstruction technique. COMPARISON:  CT Head and C Spine 01/14/22 FINDINGS: CT HEAD FINDINGS Brain: No hemorrhage. No hydrocephalus no extra-axial fluid collection. No mass effect. No mass lesion. 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 Skull: Normal. Negative for fracture or focal lesion. Other: None. CT MAXILLOFACIAL FINDINGS Osseous: No fracture or mandibular dislocation. No destructive process. Orbits: Negative. No traumatic or inflammatory finding. Bilateral lens replacement. Sinuses: No middle ear or mastoid effusion. Paranasal sinuses are clear. Soft tissues: There is asymmetric soft tissue swelling around the right parotid gland, right submandibular gland, and in the subcutaneous soft tissues of the right face. Findings are worrisome for a sialoadenitis. No evidence of a sialolith. Consider further evaluation with dedicated contrast-enhanced neck CT for more definitive characterization CT CERVICAL SPINE FINDINGS Alignment: Grade 1 anterolisthesis of  C2 on C3, C3 on C4, and C7 on T1 Skull base and vertebrae: No acute fracture. No primary bone lesion or focal pathologic process. Soft tissues and spinal canal: No prevertebral fluid or swelling. No visible canal hematoma. Disc levels:  No CT evidence of high-grade spinal canal stenosis. Upper chest: Negative. Other: Redemonstrated is chronic osseous remodeling around the right glenohumeral joint with thinning of the distal right clavicle IMPRESSION: 1. No CT evidence of intracranial injury. 2. No acute facial bone fracture. 3. No acute fracture or traumatic subluxation of the cervical spine. 4. Asymmetric soft tissue swelling around the right parotid gland, right submandibular gland, and in the subcutaneous soft tissues of the right face. Findings are worrisome for a sialoadenitis. No evidence of a sialolith. Consider further evaluation with dedicated contrast-enhanced neck CT for further evaluation. 5. 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 Electronically Signed   By: Lyndall Gore M.D.   On: 05/15/2023 12:51   DG Pelvis 1-2 Views Result Date: 05/15/2023 CLINICAL DATA:  89 year old female status post fall. EXAM: PELVIS - 1-2 VIEW COMPARISON:  CT Abdomen and Pelvis 01/03/2020. Pelvis radiograph 11/06/2018. FINDINGS: Portable AP supine view at 1216 hours. Mildly oblique to the right. Femoral heads remain normally located. Osteopenia. Limited pelvic bone detail also due to rectal retained stool. No pelvis fracture identified. Aortoiliac calcified atherosclerosis. Grossly intact proximal femurs. Nonobstructed bowel-gas pattern. IMPRESSION: No acute fracture or dislocation identified about the pelvis. If there is lateralizing hip pain then dedicated hip series is recommended. Electronically Signed   By: VEAR Hurst M.D.   On: 05/15/2023 12:40        Scheduled Meds:  docusate sodium   100 mg Oral BID   heparin  injection (subcutaneous)  5,000 Units  Subcutaneous Q8H   levothyroxine   112 mcg Oral Q0600   Continuous Infusions:  sodium chloride  100 mL/hr at 05/16/23 1015   ampicillin -sulbactam (UNASYN ) IV Stopped (05/15/23 1903)   linezolid  (ZYVOX ) IV 600 mg (05/16/23 1429)     LOS: 1 day  Time spent: 35 minutes    Alexanderia Gorby A Madelyne, MD [facility]   If 7PM-7AM, please contact night-coverage [email or web address]  05/16/2023, 4:45 PM

## 2023-05-16 NOTE — Procedures (Signed)
 Modified Barium Swallow Study  Patient Details  Name: Bethany Blake MRN: [id number] Date of Birth: 1921-07-21  Today's Date: 05/16/2023  Modified Barium Swallow completed.  Full report located under Chart Review in the Imaging Section.  History of Present Illness Per MD note Bethany Blake is a 89 y.o. female with medical history significant for CKD, hypertension, advanced dementia being admitted to the hospital with sialoadenitis and fall .  She is somewhat limited as the patient is nonverbal, she is a resident of [facility] nursing facility.  Her son and daughter both live out of state, ER provider spoke with her son over the phone.  Workup as detailed below shows evidence of dehydration, hypernatremia, AKI, and sialoadenitis.  Patient has been started on IV antibiotics, given IV fluids and admitted to the hospitalist service.  Patient has prior medical history of dysphagia diagnosed October 2023 with recommendations of dysphagia 2 and thin liquid at that time with strict precautions.  Past medical history also includes COVID.  MD was in touch with Dr. Roark ENT who recommended IV antibiotics, intermittent gland massage, heat and NSAIDs.   Clinical Impression Limited assessment secondary to pt's edema and level of dysphagia - and view limited by pt's right shoulder being elevated to near ear impairing view.    Pt demonstrates moderate oral dysphagia with impaired lingual transiting of boluses and decr labial closure with resultant piecemealing and retention.  Severe pharyngeal dysphagia c/b severely impaired pharyngeal motility due to edema from current illness.  Pharyngeal contraction, tongue base retraction and laryngeal closure impaired allowing severe retention (mixing with severe secretions) and laryngeal penetration/trace aspiration.   Pt has some awareness/sensation to pharyngeal impairment as she conducts dry swallows without cues. She also largely senses severe frank laryngeal penetration as she  reflexively coughs and attempts to expectorate.  Comparing imaging from prior MBS from 2023 reveals severe edema currently. Pt did not conduct head turn as SLP requested to determine if may decrease retention. At this time, recommend NPO with aggressive oral care and frequent oral moisture - tsps of thin water after mouth care and encouraging pt to cough and expectorate as able.  Will follow up for readiness for intake - hopefully as edema dissipates.  Of note, SLP provided oral suctioning following exam and removed viscous secretions coated with barium - frequent oral care is vital for this pt - RN informed. Factors that may increase risk of adverse event in presence of aspiration [name] 2021): Reduced cognitive function;Poor general health and/or compromised immunity;Reduced saliva;Inadequate oral hygiene;Frail or deconditioned;Limited mobility  Swallow Evaluation Recommendations Recommendations:  (tsps of thin water after mouth care) Liquid Administration via: Spoon Medication Administration: Via alternative means Supervision: Full supervision/cueing for swallowing strategies Swallowing strategies  : Slow rate;Small bites/sips Postural changes: Position pt fully upright for meals;Stay upright 30-60 min after meals Oral care recommendations: Oral care QID (4x/day) Caregiver Recommendations: Have oral suction available    Madelin POUR, MS [facility] SLP Acute Rehab Services Office [phone]   Nicolas Emmie Caldron 05/16/2023,2:15 PM

## 2023-05-16 NOTE — Evaluation (Signed)
 Physical Therapy Evaluation Patient Details Name: Bethany Blake MRN: [id number] DOB: September 29, 1921 Today's Date: 05/16/2023  History of Present Illness  89 yo female admitted with sialoadenitis. Hx of dementia, frequent falls, CKD, hyponatremia, hypothyroidism  Clinical Impression  Upon my arrival, found pt in bed with half of front of gown and blanket/sheet soaking wet. Pt was also positioned 1/3 of way down bed length lying diagonally. Called out to front desk for assistance-waited ~10 minutes but no one ever came to room to assess or assist with patient. Determined possible cause of pt being soaked-IV appears to be leaking. Paused IV-made RN aware upon my exit from room that IV was paused due to possible leaking. Also made aware that pt may need assistance with purewick and mesh panties-appeared ill-fitting and not operating properly. On eval, pt required Total A for mobility. Briefly sat pt up at EOB-Mod A for sitting balance. Pt then became resistant and and began trying to get back in bed. Assisted pt back to supine and repositioned for comfort. No family present during session. Unsure of patient's PLOF. Will plan to follow to assist pt's ability to participate meaningfully with therapy.      If plan is discharge home, recommend the following: Two people to help with walking and/or transfers;Two people to help with bathing/dressing/bathroom;Assistance with feeding   Can travel by private vehicle        Equipment Recommendations None recommended by PT  Recommendations for Other Services       Functional Status Assessment Patient has had a recent decline in their functional status and/or demonstrates limited ability to make significant improvements in function in a reasonable and predictable amount of time     Precautions / Restrictions Precautions Precautions: Fall Restrictions Weight Bearing Restrictions Per Provider Order: No      Mobility  Bed Mobility Overal bed mobility: Needs  Assistance Bed Mobility: Rolling, Supine to Sit, Sit to Supine Rolling: Max assist   Supine to sit: Total assist, +2 for physical assistance, +2 for safety/equipment Sit to supine: Total assist, +2 for physical assistance, +2 for safety/equipment   General bed mobility comments: Multimodal cueing required. Assist for trunk and bil LEs. Able to get pt EOB but she almost immediately became resistant and began trying to get back in bed. Mod A for sitting balance.    Transfers                        Ambulation/Gait                  Stairs            Wheelchair Mobility     Tilt Bed    Modified Rankin (Stroke Patients Only)       Balance                                             Pertinent Vitals/Pain Pain Assessment Pain Assessment: Faces Faces Pain Scale: Hurts little more Pain Location: R jaw Pain Descriptors / Indicators: Grimacing Pain Intervention(s): Limited activity within patient's tolerance, Monitored during session, Repositioned    Home Living Family/patient expects to be discharged to:: Assisted living                        Prior Function Prior Level of Function : History  of Falls (last six months)             Mobility Comments: pt is non verbal. unsure of plof ADLs Comments: pt is non verbal. unsure of plof     Extremity/Trunk Assessment   Upper Extremity Assessment Upper Extremity Assessment: Defer to OT evaluation    Lower Extremity Assessment Lower Extremity Assessment: Generalized weakness (pt able to lift LEs off bed with cueing)    Cervical / Trunk Assessment Cervical / Trunk Assessment: Kyphotic  Communication   Communication Communication: Difficulty communicating thoughts/reduced clarity of speech;Difficulty following commands/understanding Following commands: Follows one step commands inconsistently Cueing Techniques: Verbal cues;Gestural cues;Tactile cues  Cognition Arousal:  Alert Behavior During Therapy: Restless Overall Cognitive Status: Difficult to assess                                          General Comments      Exercises     Assessment/Plan    PT Assessment Patient needs continued PT services  PT Problem List Decreased strength;Decreased range of motion;Decreased activity tolerance;Decreased balance;Decreased mobility;Decreased knowledge of use of DME;Decreased cognition;Pain       PT Treatment Interventions DME instruction;Gait training;Functional mobility training;Therapeutic activities;Therapeutic exercise;Patient/family education;Balance training    PT Goals (Current goals can be found in the Care Plan section)  Acute Rehab PT Goals PT Goal Formulation: Patient unable to participate in goal setting Time For Goal Achievement: 05/30/23 Potential to Achieve Goals: Poor    Frequency Min 1X/week     Co-evaluation               AM-PAC PT 6 Clicks Mobility  Outcome Measure Help needed turning from your back to your side while in a flat bed without using bedrails?: Total Help needed moving from lying on your back to sitting on the side of a flat bed without using bedrails?: Total Help needed moving to and from a bed to a chair (including a wheelchair)?: Total Help needed standing up from a chair using your arms (e.g., wheelchair or bedside chair)?: Total Help needed to walk in hospital room?: Total Help needed climbing 3-5 steps with a railing? : Total 6 Click Score: 6    End of Session   Activity Tolerance: Patient limited by pain Patient left: in bed;with call bell/phone within reach;with bed alarm set   PT Visit Diagnosis: Muscle weakness (generalized) (M62.81);History of falling (Z91.81);Repeated falls (R29.6);Difficulty in walking, not elsewhere classified (R26.2)    Time: 8476-8442 PT Time Calculation (min) (ACUTE ONLY): 34 min   Charges:   PT Evaluation $PT Eval Low Complexity: 1 Low PT  Treatments $Therapeutic Activity: 8-22 mins PT General Charges $$ ACUTE PT VISIT: 1 Visit          Dannial SQUIBB, PT Acute Rehabilitation  Office: [phone]

## 2023-05-16 NOTE — Plan of Care (Signed)

## 2023-05-17 DIAGNOSIS — K1120 Sialoadenitis, unspecified: Secondary | ICD-10-CM | POA: Diagnosis not present

## 2023-05-17 LAB — CBC
HCT: 27.9 % — ABNORMAL LOW (ref 36.0–46.0)
Hemoglobin: 8 g/dL — ABNORMAL LOW (ref 12.0–15.0)
MCH: 23.7 pg — ABNORMAL LOW (ref 26.0–34.0)
MCHC: 28.7 g/dL — ABNORMAL LOW (ref 30.0–36.0)
MCV: 82.5 fL (ref 80.0–100.0)
Platelets: 284 10*3/uL (ref 150–400)
RBC: 3.38 MIL/uL — ABNORMAL LOW (ref 3.87–5.11)
RDW: 17.7 % — ABNORMAL HIGH (ref 11.5–15.5)
WBC: 12.5 10*3/uL — ABNORMAL HIGH (ref 4.0–10.5)
nRBC: 0 % (ref 0.0–0.2)

## 2023-05-17 LAB — BASIC METABOLIC PANEL
Anion gap: 9 (ref 5–15)
BUN: 43 mg/dL — ABNORMAL HIGH (ref 8–23)
CO2: 22 mmol/L (ref 22–32)
Calcium: 8.3 mg/dL — ABNORMAL LOW (ref 8.9–10.3)
Chloride: 116 mmol/L — ABNORMAL HIGH (ref 98–111)
Creatinine, Ser: 1.12 mg/dL — ABNORMAL HIGH (ref 0.44–1.00)
GFR, Estimated: 44 mL/min — ABNORMAL LOW (ref >60)
Glucose, Bld: 83 mg/dL (ref 70–99)
Potassium: 3.1 mmol/L — ABNORMAL LOW (ref 3.5–5.1)
Sodium: 147 mmol/L — ABNORMAL HIGH (ref 135–145)

## 2023-05-17 LAB — MAGNESIUM: Magnesium: 2 mg/dL (ref 1.7–2.4)

## 2023-05-17 MED ORDER — POTASSIUM CHLORIDE 10 MEQ/100ML IV SOLN
10.0000 meq | INTRAVENOUS | Status: AC
  Administered 2023-05-17 (×3): 10 meq via INTRAVENOUS
  Filled 2023-05-17 (×3): qty 100

## 2023-05-17 MED ORDER — METHYLPREDNISOLONE SODIUM SUCC 40 MG IJ SOLR: 40.0000 mg | Freq: Two times a day (BID) | INTRAMUSCULAR | Status: DC

## 2023-05-17 MED ORDER — SODIUM CHLORIDE 0.45 % IV SOLN: INTRAVENOUS | Status: DC

## 2023-05-17 MED ORDER — SODIUM CHLORIDE 0.9 % IV SOLN
3.0000 g | Freq: Two times a day (BID) | INTRAVENOUS | Status: DC
  Administered 2023-05-17 – 2023-05-23 (×13): 3 g via INTRAVENOUS
  Filled 2023-05-17 (×13): qty 8

## 2023-05-17 MED ORDER — METHYLPREDNISOLONE SODIUM SUCC 40 MG IJ SOLR
40.0000 mg | INTRAMUSCULAR | Status: DC
  Administered 2023-05-17 – 2023-05-22 (×6): 40 mg via INTRAVENOUS
  Filled 2023-05-17 (×6): qty 1

## 2023-05-17 MED ORDER — METHYLPREDNISOLONE SODIUM SUCC 40 MG IJ SOLR: 40.0000 mg | INTRAMUSCULAR | Status: DC

## 2023-05-17 NOTE — Evaluation (Signed)
 Occupational Therapy Evaluation Patient Details Name: Bethany Blake MRN: [id number] DOB: 08-Nov-1921 Today's Date: 05/17/2023   History of Present Illness  89 yo female admitted with sialoadenitis. Hx of dementia, frequent falls, CKD, hyponatremia, hypothyroidism    Clinical Impression   Patient is currently requiring assistance with ADLs including Total assist with Lower body ADLs, up to Total assist with Upper body ADLs,  as well as  Total assist with bed mobility and inability to perform functional transfers to [facility].  Current level of function is below patient's typical baseline per [facility] where pt ambulated short distances with RW and CGA, fed self and received/required less assistance with mobility and ADLs as well as ability to follow simple 1-step instructions.    During this evaluation, patient was limited by generalized weakness, impaired activity tolerance, OA to joints with limited UE movement, dysarthria with difficulty expressing self, baseline dementia, and RT face pain/edema, all of which has the potential to impact patient's safety and independence during functional mobility, as well as performance for ADLs.    Patient lives at [facility] without receiving skilled PT/OT for some time per staff.  Patient demonstrates fair rehab potential, and could benefit from continued skilled occupational therapy services while in acute care to maximize safety, independence and quality of life at home.  Continued occupational therapy services in-house at [facility] are recommended.  ?        If plan is discharge home, recommend the following: A lot of help with walking and/or transfers;A lot of help with bathing/dressing/bathroom;Supervision due to cognitive status;Two people to help with walking and/or transfers;Assistance with feeding;Help with stairs or ramp for entrance;Assist for transportation;Assistance with cooking/housework;Direct supervision/assist for financial  management    Functional Status Assessment  Patient has had a recent decline in their functional status and demonstrates the ability to make significant improvements in function in a reasonable and predictable amount of time.  Equipment Recommendations   (None)    Recommendations for Other Services       Precautions / Restrictions        Mobility Bed Mobility   Bed Mobility: Rolling Rolling: Max assist         General bed mobility comments: Max As of 1 to scoot to HOb in supine. Pt flexed her knees to command to assist. Unsure how much she was able to help. May have been Total Assist of 1.    Transfers                          Balance                                           ADL either performed or assessed with clinical judgement   ADL Overall ADL's : Needs assistance/impaired Eating/Feeding: Total assistance Eating/Feeding Details (indicate cue type and reason): Being followed by SLP. Can feed self at baseline with seutp. Grooming: Therapist, nutritional;Bed level;Cueing for sequencing;Cueing for safety;Moderate assistance Grooming Details (indicate cue type and reason): Washcloth placed on RT hand. Increased time for processing. Pt held in oboth hands and wiped face and some of eyes. Mod As for thoroughness. Upper Body Bathing: Total assistance;Bed level   Lower Body Bathing: Bed level;Total assistance   Upper Body Dressing : Bed level;Total assistance   Lower Body Dressing: Bed level;Total assistance  Toilet Transfer Details (indicate cue type and reason): Unable this date. Pt declined sitting EOB. Toileting- Clothing Manipulation and Hygiene: Bed level;Total assistance       Functional mobility during ADLs: Total assistance       Vision   Additional Comments: LT eye not opening fully. Pt making eye contact. Unable to assess further.     Perception         Praxis         Pertinent Vitals/Pain Pain Assessment Pain  Assessment: Faces Faces Pain Scale: Hurts even more Breathing: occasional labored breathing, short period of hyperventilation Negative Vocalization: occasional moan/groan, low speech, negative/disapproving quality Facial Expression: sad, frightened, frown Body Language: tense, distressed pacing, fidgeting Consolability: distracted or reassured by voice/touch PAINAD Score: 5 Pain Location: R jaw Pain Descriptors / Indicators: Grimacing, Moaning (Pushed washcloth away from RT jaw) Pain Intervention(s): Limited activity within patient's tolerance, Monitored during session, Repositioned     Extremity/Trunk Assessment Upper Extremity Assessment Upper Extremity Assessment: Right hand dominant;RUE deficits/detail;LUE deficits/detail RUE Deficits / Details: Arthritic grinding to shoulder. No active shoulder movement. Pt tolerated PROM to ~75 degrees. Elbow nearly WFL ROM but very weak. Hand with OA changes. RUE: Shoulder pain with ROM RUE Coordination: decreased fine motor;decreased gross motor LUE Deficits / Details: Arthritic grinding to shoulder. No active shoulder movement. Pt tolerated PROM to ~70 degrees. Elbow with loud arthrtic grinding with flexion and limited to ~1/2 range. 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 LUE: Shoulder pain with ROM   Lower Extremity Assessment Lower Extremity Assessment: Generalized weakness (Able to bed knees to assist with bed mobility)   Cervical / Trunk Assessment Cervical / Trunk Assessment: Kyphotic   Communication Communication Communication: Difficulty communicating thoughts/reduced clarity of speech;Difficulty following commands/understanding (Very dysarthric, speech garbled sounding but pt ale to verbalize some simple words including nodding for yes and verbalizing no) Following commands: Follows one step commands inconsistently;Follows one step commands with increased time Cueing Techniques: Verbal cues;Tactile cues;Visual cues;Gestural cues    Cognition Arousal: Alert Behavior During Therapy: Flat affect Overall Cognitive Status: Impaired/Different from baseline                                 General Comments: Pt typically oriented to person and place. Usually can follow 1-step commands. h/o memory deficits.  Pt was unawafre of being in the hospital while OT worked with her, I am?  Pt reoriented to sitation and pt nodded.     General Comments       Exercises     Shoulder Instructions      Home Living Family/patient expects to be discharged to:: Other (Comment)                                 Additional Comments: Long Term Resident at [facility]. Has not had skilled PT/OT services for some times      Prior Functioning/Environment Prior Level of Function : History of Falls (last six months);Needs assist       Physical Assist : Mobility (physical);ADLs (physical) Mobility (physical): Bed mobility;Transfers;Gait (light assistance ~CGA/) ADLs (physical): Grooming;Bathing;Dressing;Toileting;IADLs Mobility Comments: Has had one fall. Ambulates short distances with RW and ~CGA. Assisted in Transport chair for longer distances. Light assist for bed mobility and transfers. ADLs Comments: Pt beginning to verbalize a little but gather PLOF from contacted [facility] and speaking with administration  as well as Epifanio, the architectural technologist. Pt can feed self at baseline but assisted with all else. Not total care for facility.        OT Problem List: Decreased strength;Decreased cognition;Decreased range of motion;Decreased coordination;Impaired balance (sitting and/or standing);Pain;Decreased activity tolerance;Decreased knowledge of use of DME or AE;Impaired UE functional use      OT Treatment/Interventions: Self-care/ADL training;Therapeutic activities;Therapeutic exercise;Cognitive remediation/compensation;DME and/or AE instruction;Balance training    OT Goals(Current goals can be found in the  care plan section) Acute Rehab OT Goals Patient Stated Goal: Pt requesting food. Reoriented to reason for NPO. Pt nodded. OT Goal Formulation: Patient unable to participate in goal setting Time For Goal Achievement: 05/31/23 Potential to Achieve Goals: Fair ADL Goals Pt Will Perform Grooming: sitting;with mod assist (EOB) Pt Will Transfer to Toilet: with min assist;bedside commode;stand pivot transfer Pt/caregiver will Perform Home Exercise Program: Increased ROM;With minimal assist Additional ADL Goal #1: Pt will assist staff with bed mobility and lessen assist needed to no more than moderate assist of 1 person, and will follow 1-step simple commands 75% of time during mobility in order to decrease caregiver workload.  OT Frequency: Min 1X/week    Co-evaluation              AM-PAC OT 6 Clicks Daily Activity     Outcome Measure Help from another person eating meals?: Total (NPO) Help from another person taking care of personal grooming?: A Lot Help from another person toileting, which includes using toliet, bedpan, or urinal?: Total Help from another person bathing (including washing, rinsing, drying)?: Total Help from another person to put on and taking off regular upper body clothing?: Total Help from another person to put on and taking off regular lower body clothing?: Total 6 Click Score: 7   End of Session Nurse Communication: Other (comment) (IV beeping)  Activity Tolerance: Patient limited by fatigue Patient left: in bed;with call bell/phone within reach;with bed alarm set  OT Visit Diagnosis: Pain Pain - Right/Left: Right Pain - part of body:  (face. Also OA to joints)                Time: 0930-1008 OT Time Calculation (min): 38 min Charges:  OT General Charges $OT Visit: 1 Visit OT Evaluation $OT Eval Moderate Complexity: 1 Mod OT Treatments $Self Care/Home Management : 8-22 mins  Delon, OT Acute Rehab Services Office:  [phone] 05/17/2023   Delon Falter 05/17/2023, 10:27 AM

## 2023-05-17 NOTE — Plan of Care (Addendum)
 Plan of care is reviewed.   Problem: Clinical Measurements: Sialoadenitis Goal: Ability to maintain clinical measurements within normal limits will improve Outcome: Progressing Goal: Will remain free from infection Outcome: Progressing-getting IV antibiotics. Goal: Respiratory complications will improve Outcome: Progressing- on room air, normal respiratory effort, no SOB, lung sound-clear bilaterally, no distress.  Goal: Cardiovascular complication will be avoided Outcome: Progressing-hemodynamically stable.    Problem: Activity: advanced dementia, weakness and frail, dependent.   Goal: Risk for activity intolerance will decrease Outcome: Progressing-passive activities, ROM and position changed q 2 hrs.    Problem: Nutrition: NPO, right side facial swelling. Goal: Adequate nutrition will be maintained Outcome: Not Progressing-NPO, dysphagia, high  risk of aspiration. Pt is getting IV hydration with 0.45% NSS 100 ml/hr per MD order.   Problem: Elimination: incontinence.  Goal: Will not experience complications related to bowel motility Outcome: Progressing- last BM 05/15/23 Goal: Will not experience complications related to urinary retention Outcome: Progressing-on external urine catheter(PureWick)   Problem: Pain Managment:  Goal: General experience of comfort will improve and/or be controlled Outcome: Progressing- no complaint of pain. She is able to rest well overnight. Heating pad given to help relieving pain and swelling on her right face.   Problem: Skin Integrity: Goal: Risk for impaired skin integrity will decrease Outcome: Progressing-skin is intact, no pressure ulcer seen.    Wendi Dash, RN

## 2023-05-17 NOTE — Consult Note (Signed)
 Reason for Consult:facial infection Referring Physician: Dr Madelyne Vernard [name] is an 89 y.o. female.  HPI: hx of multiple medical issues and nursing home living. She is admitted for sialadentitis and has been on IV abx for 1-2 days. She is with extremely dry mouth. Ct scan noncontrast did not show fluid collection. Right submandibular and parotid with swelling  Past Medical History:  Diagnosis Date   Anxiety    Arthritis    Chronic kidney disease    Hypertension    Hypokalemia    Hyponatremia    Hypothyroidism     Past Surgical History:  Procedure Laterality Date   SHOULDER SURGERY     WRIST SURGERY      Family History  Problem Relation Age of Onset   Hypertension Other    Colon cancer Neg Hx    Colon polyps Neg Hx    Rectal cancer Neg Hx    Stomach cancer Neg Hx     Social History:  reports that she has quit smoking. She has never used smokeless tobacco. She reports that she does not drink alcohol  and does not use drugs.  Allergies:  Allergies  Allergen Reactions   Ace Inhibitors Other (See Comments)    Severe hyponatremia. Allergy not listed on MAR    Aldactone [Spironolactone] Other (See Comments)    Severe hyponatremia. Allergy not listed on MAR    Angiotensin Receptor Blockers Other (See Comments)    Severe hyponatremia. Allergy not listed on MAR    Sulfa Antibiotics Rash    Allergy not listed on MAR     Medications: I have reviewed the patient's current medications.  Results for orders placed or performed during the hospital encounter of 05/15/23 (from the past 48 hours)  CBC with Differential     Status: Abnormal   Collection Time: 05/15/23 11:59 AM  Result Value Ref Range   WBC 10.4 4.0 - 10.5 K/uL   RBC 4.87 3.87 - 5.11 MIL/uL   Hemoglobin 11.6 (L) 12.0 - 15.0 g/dL   HCT 60.7 63.9 - 53.9 %   MCV 80.5 80.0 - 100.0 fL   MCH 23.8 (L) 26.0 - 34.0 pg   MCHC 29.6 (L) 30.0 - 36.0 g/dL   RDW 82.1 (H) 88.4 - 84.4 %   Platelets 422 (H) 150 - 400 K/uL    nRBC 0.0 0.0 - 0.2 %   Neutrophils Relative % 88 %   Neutro Abs 9.2 (H) 1.7 - 7.7 K/uL   Lymphocytes Relative 7 %   Lymphs Abs 0.8 0.7 - 4.0 K/uL   Monocytes Relative 4 %   Monocytes Absolute 0.4 0.1 - 1.0 K/uL   Eosinophils Relative 0 %   Eosinophils Absolute 0.0 0.0 - 0.5 K/uL   Basophils Relative 0 %   Basophils Absolute 0.0 0.0 - 0.1 K/uL   Immature Granulocytes 1 %   Abs Immature Granulocytes 0.05 0.00 - 0.07 K/uL    Comment: Performed at [facility], [address] [place], [place] [address]  Comprehensive metabolic panel     Status: Abnormal   Collection Time: 05/15/23 11:59 AM  Result Value Ref Range   Sodium 147 (H) 135 - 145 mmol/L   Potassium 5.2 (H) 3.5 - 5.1 mmol/L   Chloride 107 98 - 111 mmol/L   CO2 24 22 - 32 mmol/L   Glucose, Bld 119 (H) 70 - 99 mg/dL    Comment: Glucose reference range applies only to samples taken after fasting for  at least 8 hours.   BUN 56 (H) 8 - 23 mg/dL   Creatinine, Ser 8.21 (H) 0.44 - 1.00 mg/dL   Calcium 9.4 8.9 - 89.6 mg/dL   Total Protein 7.8 6.5 - 8.1 g/dL   Albumin 3.3 (L) 3.5 - 5.0 g/dL   AST 17 15 - 41 U/L   ALT 8 0 - 44 U/L   Alkaline Phosphatase 55 38 - 126 U/L   Total Bilirubin 1.1 0.0 - 1.2 mg/dL   GFR, Estimated 25 (L) >60 mL/min    Comment: (NOTE) Calculated using the CKD-EPI Creatinine Equation (2021)    Anion gap 16 (H) 5 - 15    Comment: Performed at [facility], [address] [place], [place] [address]  Basic metabolic panel     Status: Abnormal   Collection Time: 05/15/23  8:55 Blake  Result Value Ref Range   Sodium 149 (H) 135 - 145 mmol/L   Potassium 4.2 3.5 - 5.1 mmol/L   Chloride 114 (H) 98 - 111 mmol/L   CO2 20 (L) 22 - 32 mmol/L   Glucose, Bld 115 (H) 70 - 99 mg/dL    Comment: Glucose reference range applies only to samples taken after fasting for at least 8 hours.   BUN 50 (H) 8 - 23 mg/dL   Creatinine, Ser 8.41 (H) 0.44 - 1.00 mg/dL   Calcium 8.4 (L) 8.9 - 10.3  mg/dL   GFR, Estimated 29 (L) >60 mL/min    Comment: (NOTE) Calculated using the CKD-EPI Creatinine Equation (2021)    Anion gap 15 5 - 15    Comment: Performed at [facility], [address] [place], [place] [address]  Basic metabolic panel     Status: Abnormal   Collection Time: 05/16/23  4:39 AM  Result Value Ref Range   Sodium 150 (H) 135 - 145 mmol/L   Potassium 4.0 3.5 - 5.1 mmol/L   Chloride 117 (H) 98 - 111 mmol/L   CO2 19 (L) 22 - 32 mmol/L   Glucose, Bld 103 (H) 70 - 99 mg/dL    Comment: Glucose reference range applies only to samples taken after fasting for at least 8 hours.   BUN 49 (H) 8 - 23 mg/dL   Creatinine, Ser 8.46 (H) 0.44 - 1.00 mg/dL   Calcium 8.5 (L) 8.9 - 10.3 mg/dL   GFR, Estimated 30 (L) >60 mL/min    Comment: (NOTE) Calculated using the CKD-EPI Creatinine Equation (2021)    Anion gap 14 5 - 15    Comment: Performed at [facility], [address], [place], [place] [address]  CBC     Status: Abnormal   Collection Time: 05/16/23  4:39 AM  Result Value Ref Range   WBC 14.5 (H) 4.0 - 10.5 K/uL   RBC 3.96 3.87 - 5.11 MIL/uL   Hemoglobin 9.4 (L) 12.0 - 15.0 g/dL   HCT 67.1 (L) 63.9 - 53.9 %   MCV 82.8 80.0 - 100.0 fL   MCH 23.7 (L) 26.0 - 34.0 pg   MCHC 28.7 (L) 30.0 - 36.0 g/dL   RDW 81.8 (H) 88.4 - 84.4 %   Platelets 347 150 - 400 K/uL   nRBC 0.0 0.0 - 0.2 %    Comment: Performed at [facility], [address], [place], [place] [address]    DG Swallowing Func-Speech Pathology Result Date: Blake Table formatting from the original result was not included. Modified Barium Swallow Study  Patient Details Name: Bethany Blake MRN: [id number] Date of Birth: 1922/02/04 Today's Date: Blake HPI/PMH: HPI: Per MD note Bethany Blake is a 89 y.o. female with medical history significant for CKD, hypertension, advanced dementia being admitted to the hospital with sialoadenitis and fall .  She is somewhat  limited as the patient is nonverbal, she is a resident of [facility] nursing facility.  Her son and daughter both live out of state, ER provider spoke with her son over the phone.  Workup as detailed below shows evidence of dehydration, hypernatremia, AKI, and sialoadenitis.  Patient has been started on IV antibiotics, given IV fluids and admitted to the hospitalist service.  Patient has prior medical history of dysphagia diagnosed October 2023 with recommendations of dysphagia 2 and thin liquid at that time with strict precautions.  Past medical history also includes COVID.  MD was in touch with Dr. Roark ENT who recommended IV antibiotics, intermittent gland massage, heat and NSAIDs. Clinical Impression: Clinical Impression: Limited assessment secondary to pt's edema and level of dysphagia - and view limited by pt's right shoulder being elevated to near ear impairing view.    Pt demonstrates moderate oral dysphagia with impaired lingual transiting of boluses and decr labial closure with resultant piecemealing and retention.  Severe pharyngeal dysphagia c/b severely impaired pharyngeal motility due to edema from current illness.  Pharyngeal contraction, tongue base retraction and laryngeal closure impaired allowing severe retention (mixing with severe secretions) and laryngeal penetration/trace aspiration.   Pt has some awareness/sensation to pharyngeal impairment as she conducts dry swallows without cues. She also largely senses severe frank laryngeal penetration as she reflexively coughs and attempts to expectorate.  Comparing imaging from prior MBS from 2023 reveals severe edema currently. Pt did not conduct head turn as SLP requested to determine if may decrease retention. At this time, recommend NPO with aggressive oral care and frequent oral moisture - tsps of thin water after mouth care and encouraging pt to cough and expectorate as able.  Will follow up for readiness for intake - hopefully as edema  dissipates.  Of note, SLP provided oral suctioning following exam and removed viscous secretions coated with barium - frequent oral care is vital for this pt - RN informed. Factors that may increase risk of adverse event in presence of aspiration [name] 2021): Factors that may increase risk of adverse event in presence of aspiration [name] 2021): Reduced cognitive function; Poor general health and/or compromised immunity; Reduced saliva; Inadequate oral hygiene; Frail or deconditioned; Limited mobility Recommendations/Plan: Swallowing Evaluation Recommendations Swallowing Evaluation Recommendations Recommendations: -- (tsps of thin water after mouth care) Liquid Administration via: Spoon Medication Administration: Via alternative means Supervision: Full supervision/cueing for swallowing strategies Swallowing strategies  : Slow rate; Small bites/sips Postural changes: Position pt fully upright for meals; Stay upright 30-60 min after meals Oral care recommendations: Oral care QID (4x/day) Caregiver Recommendations: Have oral suction available Treatment Plan Treatment Plan Treatment recommendations: Therapy as outlined in treatment plan below Follow-up recommendations: Skilled nursing-short term rehab (<3 hours/day) Functional status assessment: Patient has had a recent decline in their functional status and demonstrates the ability to make significant improvements in function in a reasonable and predictable amount of time. Treatment frequency: Min 1x/week Treatment duration: 1 week Interventions: Aspiration precaution training; Other (comment); Trials of upgraded texture/liquids; Patient/family education; Compensatory techniques Recommendations Recommendations for follow up therapy are one component of a multi-disciplinary discharge planning process, led by the attending physician.  Recommendations may  be updated based on patient status, additional functional criteria and insurance authorization.  Assessment: Orofacial Exam: Orofacial Exam Oral Cavity: Oral Hygiene: Xerostomia; Other (comment); Dried secretions Oral Cavity - Dentition: Other (Comment); Dentures, not available Orofacial Anatomy: Other (comment) Oral Motor/Sensory Function: Other (comment) (pt with difficulty sealing her lips - lingual protrusion) Anatomy: No data recorded Boluses Administered: Boluses Administered Boluses Administered: Thin liquids (Level 0); Mildly thick liquids (Level 2, nectar thick)  Oral Impairment Domain: Oral Impairment Domain Lip Closure: Escape progressing to mid-chin Tongue control during bolus hold: Posterior escape of greater than half of bolus Bolus transport/lingual motion: Delayed initiation of tongue motion (oral holding); Slow tongue motion Oral residue: Residue collection on oral structures Location of oral residue : Tongue Initiation of pharyngeal swallow : Pyriform sinuses  Pharyngeal Impairment Domain: Pharyngeal Impairment Domain Soft palate elevation: No bolus between soft palate (SP)/pharyngeal wall (PW) Laryngeal elevation: Partial superior movement of thyroid  cartilage/partial approximation of arytenoids to epiglottic petiole Anterior hyoid excursion: Partial anterior movement Epiglottic movement: Partial inversion Laryngeal vestibule closure: Incomplete, narrow column air/contrast in laryngeal vestibule Pharyngeal stripping wave : Present - diminished Pharyngeal contraction (A/P view only): N/A Pharyngoesophageal segment opening: Partial distention/partial duration, partial obstruction of flow (difficult view due to pt's shoulder blocking view and snap obstructing as well) Tongue base retraction: Wide column of contrast or air between tongue base and PPW Pharyngeal residue: Majority of contrast within or on pharyngeal structures; Minimal to no pharyngeal clearance Location of pharyngeal residue: Tongue base; Pharyngeal wall; Valleculae; Diffuse (>3 areas)  Esophageal Impairment Domain: No data  recorded Pill: Pill Consistency administered: -- (DNt) Penetration/Aspiration Scale Score: Penetration/Aspiration Scale Score 3.  Material enters airway, remains ABOVE vocal cords and not ejected out: Mildly thick liquids (Level 2, nectar thick) 8.  Material enters airway, passes BELOW cords without attempt by patient to eject out (silent aspiration) : Thin liquids (Level 0) Compensatory Strategies: Compensatory Strategies Compensatory strategies: No   General Information: Caregiver present: No  Diet Prior to this Study: NPO   Temperature : Normal   Respiratory Status: WFL   Supplemental O2: None (Room air)   History of Recent Intubation: No  Behavior/Cognition: Alert; Other (Comment) (inconsistently follows directions - at times is resistant) No data recorded Baseline vocal quality/speech: Hypophonia/low volume; Other (comment) (wet) Volitional Cough: Able to elicit Volitional Swallow: Unable to elicit Exam Limitations: Limited visibility; Poor bolus acceptance Goal Planning: Prognosis for improved oropharyngeal function: Good Barriers to Reach Goals: Time post onset No data recorded Patient/Family Stated Goal: None stated, patient is nonverbal Consulted and agree with results and recommendations: Patient; Nurse Pain: Pain Assessment Pain Assessment: Faces Faces Pain Scale: 0 Breathing: 0 Negative Vocalization: 0 Facial Expression: 0 Body Language: 0 Consolability: 0 PAINAD Score: 0 End of Session: Start Time:SLP Start Time (ACUTE ONLY): 1020 Stop Time: SLP Stop Time (ACUTE ONLY): 1040 Time Calculation:SLP Time Calculation (min) (ACUTE ONLY): 20 min Charges: SLP Evaluations $ SLP Speech Visit: 1 Visit SLP Evaluations $BSS Swallow: 1 Procedure SLP visit diagnosis: SLP Visit Diagnosis: Dysphagia, oropharyngeal phase (R13.12) Past Medical History: Past Medical History: Diagnosis Date  Anxiety   Arthritis   Chronic kidney disease   Hypertension   Hypokalemia   Hyponatremia   Hypothyroidism  Past Surgical History: Past  Surgical History: Procedure Laterality Date  SHOULDER SURGERY    WRIST SURGERY   Bethany POUR, MS [facility] SLP Acute Rehab Services Office [phone] Bethany Blake, Bethany Blake  CT Head Wo Contrast Result Date: 05/15/2023 CLINICAL  DATA:  Polytrauma, blunt; Facial trauma, blunt EXAM: CT HEAD WITHOUT CONTRAST CT MAXILLOFACIAL WITHOUT CONTRAST CT CERVICAL SPINE WITHOUT CONTRAST TECHNIQUE: Multidetector CT imaging of the head, cervical spine, and maxillofacial structures were performed using the standard protocol without intravenous contrast. Multiplanar CT image reconstructions of the cervical spine and maxillofacial structures were also generated. RADIATION DOSE REDUCTION: This exam was performed according to the departmental dose-optimization program which includes automated exposure control, adjustment of the mA and/or kV according to patient size and/or use of iterative reconstruction technique. COMPARISON:  CT Head and C Spine 01/14/22 FINDINGS: CT HEAD FINDINGS Brain: No hemorrhage. No hydrocephalus no extra-axial fluid collection. No mass effect. No mass lesion. 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 Skull: Normal. Negative for fracture or focal lesion. Other: None. CT MAXILLOFACIAL FINDINGS Osseous: No fracture or mandibular dislocation. No destructive process. Orbits: Negative. No traumatic or inflammatory finding. Bilateral lens replacement. Sinuses: No middle ear or mastoid effusion. Paranasal sinuses are clear. Soft tissues: There is asymmetric soft tissue swelling around the right parotid gland, right submandibular gland, and in the subcutaneous soft tissues of the right face. Findings are worrisome for a sialoadenitis. No evidence of a sialolith. Consider further evaluation with dedicated contrast-enhanced neck CT for more definitive characterization CT CERVICAL SPINE FINDINGS  Alignment: Grade 1 anterolisthesis of C2 on C3, C3 on C4, and C7 on T1 Skull base and vertebrae: No acute fracture. No primary bone lesion or focal pathologic process. Soft tissues and spinal canal: No prevertebral fluid or swelling. No visible canal hematoma. Disc levels:  No CT evidence of high-grade spinal canal stenosis. Upper chest: Negative. Other: Redemonstrated is chronic osseous remodeling around the right glenohumeral joint with thinning of the distal right clavicle IMPRESSION: 1. No CT evidence of intracranial injury. 2. No acute facial bone fracture. 3. No acute fracture or traumatic subluxation of the cervical spine. 4. Asymmetric soft tissue swelling around the right parotid gland, right submandibular gland, and in the subcutaneous soft tissues of the right face. Findings are worrisome for a sialoadenitis. No evidence of a sialolith. Consider further evaluation with dedicated contrast-enhanced neck CT for further evaluation. 5. 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 Electronically Signed   By: Lyndall Gore M.D.   On: 05/15/2023 12:51   CT Cervical Spine Wo Contrast Result Date: 05/15/2023 CLINICAL DATA:  Polytrauma, blunt; Facial trauma, blunt EXAM: CT HEAD WITHOUT CONTRAST CT MAXILLOFACIAL WITHOUT CONTRAST CT CERVICAL SPINE WITHOUT CONTRAST TECHNIQUE: Multidetector CT imaging of the head, cervical spine, and maxillofacial structures were performed using the standard protocol without intravenous contrast. Multiplanar CT image reconstructions of the cervical spine and maxillofacial structures were also generated. RADIATION DOSE REDUCTION: This exam was performed according to the departmental dose-optimization program which includes automated exposure control, adjustment of the mA and/or kV according to patient size and/or use of iterative reconstruction technique. COMPARISON:  CT Head and C Spine 01/14/22 FINDINGS: CT HEAD FINDINGS  Brain: No hemorrhage. No hydrocephalus no extra-axial fluid collection. No mass effect. No mass lesion. 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 Skull: Normal. Negative for fracture or focal lesion. Other: None. CT MAXILLOFACIAL FINDINGS Osseous: No fracture or mandibular dislocation. No destructive process. Orbits: Negative.  No traumatic or inflammatory finding. Bilateral lens replacement. Sinuses: No middle ear or mastoid effusion. Paranasal sinuses are clear. Soft tissues: There is asymmetric soft tissue swelling around the right parotid gland, right submandibular gland, and in the subcutaneous soft tissues of the right face. Findings are worrisome for a sialoadenitis. No evidence of a sialolith. Consider further evaluation with dedicated contrast-enhanced neck CT for more definitive characterization CT CERVICAL SPINE FINDINGS Alignment: Grade 1 anterolisthesis of C2 on C3, C3 on C4, and C7 on T1 Skull base and vertebrae: No acute fracture. No primary bone lesion or focal pathologic process. Soft tissues and spinal canal: No prevertebral fluid or swelling. No visible canal hematoma. Disc levels:  No CT evidence of high-grade spinal canal stenosis. Upper chest: Negative. Other: Redemonstrated is chronic osseous remodeling around the right glenohumeral joint with thinning of the distal right clavicle IMPRESSION: 1. No CT evidence of intracranial injury. 2. No acute facial bone fracture. 3. No acute fracture or traumatic subluxation of the cervical spine. 4. Asymmetric soft tissue swelling around the right parotid gland, right submandibular gland, and in the subcutaneous soft tissues of the right face. Findings are worrisome for a sialoadenitis. No evidence of a sialolith. Consider further evaluation with dedicated contrast-enhanced neck CT for further evaluation. 5. Redemonstrated is a superiorly  projecting aneurysm at the cavernous ICA on the left measuring 10 x 9 mm. Electronically Signed   By: Lyndall Gore M.D.   On: 05/15/2023 12:51   CT Maxillofacial WO CM Result Date: 05/15/2023 CLINICAL DATA:  Polytrauma, blunt; Facial trauma, blunt EXAM: CT HEAD WITHOUT CONTRAST CT MAXILLOFACIAL WITHOUT CONTRAST CT CERVICAL SPINE WITHOUT CONTRAST TECHNIQUE: Multidetector CT imaging of the head, cervical spine, and maxillofacial structures were performed using the standard protocol without intravenous contrast. Multiplanar CT image reconstructions of the cervical spine and maxillofacial structures were also generated. RADIATION DOSE REDUCTION: This exam was performed according to the departmental dose-optimization program which includes automated exposure control, adjustment of the mA and/or kV according to patient size and/or use of iterative reconstruction technique. COMPARISON:  CT Head and C Spine 01/14/22 FINDINGS: CT HEAD FINDINGS Brain: No hemorrhage. No hydrocephalus no extra-axial fluid collection. No mass effect. No mass lesion. 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 Skull: Normal. Negative for fracture or focal lesion. Other: None. CT MAXILLOFACIAL FINDINGS Osseous: No fracture or mandibular dislocation. No destructive process. Orbits: Negative. No traumatic or inflammatory finding. Bilateral lens replacement. Sinuses: No middle ear or mastoid effusion. Paranasal sinuses are clear. Soft tissues: There is asymmetric soft tissue swelling around the right parotid gland, right submandibular gland, and in the subcutaneous soft tissues of the right face. Findings are worrisome for a sialoadenitis. No evidence of a sialolith. Consider further evaluation with dedicated contrast-enhanced neck CT for more definitive characterization CT CERVICAL SPINE FINDINGS Alignment: Grade 1 anterolisthesis of  C2 on C3, C3 on C4, and C7 on T1 Skull base and vertebrae: No acute fracture. No primary bone lesion or focal pathologic process. Soft tissues and spinal canal: No prevertebral fluid or swelling. No visible canal hematoma. Disc levels:  No CT evidence of high-grade spinal canal stenosis. Upper chest: Negative. Other: Redemonstrated is chronic osseous remodeling around the right glenohumeral joint with thinning of the distal right clavicle IMPRESSION: 1. No CT evidence of intracranial injury. 2. No acute facial bone fracture. 3. No acute fracture or traumatic subluxation  of the cervical spine. 4. Asymmetric soft tissue swelling around the right parotid gland, right submandibular gland, and in the subcutaneous soft tissues of the right face. Findings are worrisome for a sialoadenitis. No evidence of a sialolith. Consider further evaluation with dedicated contrast-enhanced neck CT for further evaluation. 5. 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 Electronically Signed   By: Lyndall Gore M.D.   On: 05/15/2023 12:51   DG Pelvis 1-2 Views Result Date: 05/15/2023 CLINICAL DATA:  89 year old female status post fall. EXAM: PELVIS - 1-2 VIEW COMPARISON:  CT Abdomen and Pelvis 01/03/2020. Pelvis radiograph 11/06/2018. FINDINGS: Portable AP supine view at 1216 hours. Mildly oblique to the right. Femoral heads remain normally located. Osteopenia. Limited pelvic bone detail also due to rectal retained stool. No pelvis fracture identified. Aortoiliac calcified atherosclerosis. Grossly intact proximal femurs. Nonobstructed bowel-gas pattern. IMPRESSION: No acute fracture or dislocation identified about the pelvis. If there is lateralizing hip pain then dedicated hip series is recommended. Electronically Signed   By: VEAR Hurst M.D.   On: 05/15/2023 12:40    ROS Blood pressure (!) 173/65, pulse 78, temperature (!) 97.4 F (36.3 C), temperature source Axillary,  resp. rate 20, height 5' 4 (1.626 m), weight 47.6 kg, SpO2 97%. Physical Exam Constitutional:      Comments: Not able to give any hx  HENT:     Nose: Nose normal.     Mouth/Throat:     Comments: Extremely dry and the floor of mouth is edematous but not hard. There is dried exudate on the palate. No airway issues.  Eyes:     Pupils: Pupils are equal, round, and reactive to light.  Neck:     Comments: Right parotid is firm. Tender to palpation. Not significantly swollen.  The right submandibular gland is ptotic and swollen. Seems tender and slightly firm.       Assessment/Plan: Right sialadenitis- this involves the parotid and submandibular glands. She has floor of mouth swelling. Needs Ct scan with contrast or MRI to r/O abscess  Norleen Notice 05/17/2023, 8:26 AM

## 2023-05-17 NOTE — Progress Notes (Addendum)
 PROGRESS NOTE    Bethany Blake  FMW:[id number] DOB: 12/16/1921 DOA: 05/15/2023 PCP: Onita Rush, MD   Brief Narrative: 89 year old with past medical history significant for CKD, hypertension, advanced dementia admitted to the hospital with sialoadenitis.  History is somewhat limited due to patient being nonverbal, resident of [facility] nursing facility.  Family lives out of state, ER provider spoke with her son over the phone.  Patient presented with dehydration, hyponatremia, AKI and sialoadenitis.     Assessment & Plan:   Principal Problem:   Sialoadenitis  1-Sialoadenitis.  -CT head, neck: Asymmetric soft tissue swelling around the right parotid gland,right submandibular gland, and in the subcutaneous soft tissues of the right face. Findings are worrisome for a sialoadenitis. No evidence of a sialolith -Continue with Linezolid  to cover for MRSA, continue with Unasyn  -Discussed with Dr. Roark, we could try IV steroid.  Unable to use NSAID.  Continue with intermittent gland massage and heat. -Appreciate Dr Roark follow up. Family to discussed if they would want Sx.  -IV solumedrol 40 mg IV daily.   2-Hypernatremia: In the setting of dehydration: Continue with IV fluids  AKI, CKD stage IIIa: Cr peak to 1.7---1.1 In the setting of dehydration continue with IV fluids Improving.   Hypothyroidism: Continue with Synthroid   Thrombocytosis likely reactive in the setting of acute infection  Nutrition;  Dysphagia; Speech recommend NPO.  Follow recommendations.  MBS: moderate oral dysphagia with impaired lingual transiting of boluses and decr labial closure with resultant piecemealing and retention. Severe pharyngeal dysphagia c/b severely impaired pharyngeal motility due to edema from current illness.   Dementia;  -per son, she can carry a conversation, recognized family, she has underline dementia.   Anemia; chronic. Check anemia panel in am.  Hypokalemia; replete IV   Estimated  body mass index is 18.01 kg/m as calculated from the following:   Height as of this encounter: 5' 4 (1.626 m).   Weight as of this encounter: 47.6 kg.   DVT prophylaxis: heparin   Code Status: DNR Family Communication: Son  Disposition Plan:  Status is: Inpatient Remains inpatient appropriate because: management of sialoadenitis.     Consultants:  Dr Roark. Discussed case  Procedures:  none  Antimicrobials:    Subjective: Alert. Still having significant right side face swelling.  Difficult to understand speech.   Objective: Vitals:   05/16/23 1222 05/16/23 1943 05/17/23 0526 05/17/23 1255  BP: (!) 150/67 (!) 174/156 (!) 173/65 (!) 166/65  Pulse: 88 83 78 76  Resp: 18 18 20 16   Temp: 97.7 F (36.5 C) 97.6 F (36.4 C) (!) 97.4 F (36.3 C) 97.7 F (36.5 C)  TempSrc: Oral Oral Axillary Oral  SpO2: 94% 96% 97% 95%  Weight:      Height:        Intake/Output Summary (Last 24 hours) at 05/17/2023 1427 Last data filed at 05/17/2023 0527 Gross per 24 hour  Intake 1682.68 ml  Output 200 ml  Net 1482.68 ml   Filed Weights   05/15/23 1110  Weight: 47.6 kg    Examination:  General exam:  NAD ENT: right side face, neck swelling.  Respiratory system: CTA Cardiovascular system: S 1, S 2 RRR Gastrointestinal system: BS present, soft, nt Central nervous system: Alert.  Extremities: no edema  Data Reviewed: I have personally reviewed following labs and imaging studies  CBC: Recent Labs  Lab 05/15/23 1159 05/16/23 0439 05/17/23 0942  WBC 10.4 14.5* 12.5*  NEUTROABS 9.2*  --   --  HGB 11.6* 9.4* 8.0*  HCT 39.2 32.8* 27.9*  MCV 80.5 82.8 82.5  PLT 422* 347 284   Basic Metabolic Panel: Recent Labs  Lab 05/15/23 1159 05/15/23 2055 05/16/23 0439 05/17/23 0942  NA 147* 149* 150* 147*  K 5.2* 4.2 4.0 3.1*  CL 107 114* 117* 116*  CO2 24 20* 19* 22  GLUCOSE 119* 115* 103* 83  BUN 56* 50* 49* 43*  CREATININE 1.78* 1.58* 1.53* 1.12*  CALCIUM 9.4 8.4* 8.5*  8.3*  MG  --   --   --  2.0   GFR: Estimated Creatinine Clearance: 20.6 mL/min (A) (by C-G formula based on SCr of 1.12 mg/dL (H)). Liver Function Tests: Recent Labs  Lab 05/15/23 1159  AST 17  ALT 8  ALKPHOS 55  BILITOT 1.1  PROT 7.8  ALBUMIN 3.3*   No results for input(s): LIPASE, AMYLASE in the last 168 hours. No results for input(s): AMMONIA in the last 168 hours. Coagulation Profile: No results for input(s): INR, PROTIME in the last 168 hours. Cardiac Enzymes: No results for input(s): CKTOTAL, CKMB, CKMBINDEX, TROPONINI in the last 168 hours. BNP (last 3 results) No results for input(s): PROBNP in the last 8760 hours. HbA1C: No results for input(s): HGBA1C in the last 72 hours. CBG: No results for input(s): GLUCAP in the last 168 hours. Lipid Profile: No results for input(s): CHOL, HDL, LDLCALC, TRIG, CHOLHDL, LDLDIRECT in the last 72 hours. Thyroid  Function Tests: No results for input(s): TSH, T4TOTAL, FREET4, T3FREE, THYROIDAB in the last 72 hours. Anemia Panel: No results for input(s): VITAMINB12, FOLATE, FERRITIN, TIBC, IRON, RETICCTPCT in the last 72 hours. Sepsis Labs: No results for input(s): PROCALCITON, LATICACIDVEN in the last 168 hours.  No results found for this or any previous visit (from the past 240 hours).       Radiology Studies: DG Swallowing Func-Speech Pathology Result Date: 05/16/2023 Table formatting from the original result was not included. Modified Barium Swallow Study Patient Details Name: Bethany Blake MRN: [id number] Date of Birth: [birth year] Today's Date: 05/16/2023 HPI/PMH: HPI: Per MD note Bethany Blake is a 89 y.o. female with medical history significant for CKD, hypertension, advanced dementia being admitted to the hospital with sialoadenitis and fall .  She is somewhat limited as the patient is nonverbal, she is a resident of [facility] nursing facility.  Her son and daughter  both live out of state, ER provider spoke with her son over the phone.  Workup as detailed below shows evidence of dehydration, hypernatremia, AKI, and sialoadenitis.  Patient has been started on IV antibiotics, given IV fluids and admitted to the hospitalist service.  Patient has prior medical history of dysphagia diagnosed October 2023 with recommendations of dysphagia 2 and thin liquid at that time with strict precautions.  Past medical history also includes COVID.  MD was in touch with Dr. Roark ENT who recommended IV antibiotics, intermittent gland massage, heat and NSAIDs. Clinical Impression: Clinical Impression: Limited assessment secondary to pt's edema and level of dysphagia - and view limited by pt's right shoulder being elevated to near ear impairing view.    Pt demonstrates moderate oral dysphagia with impaired lingual transiting of boluses and decr labial closure with resultant piecemealing and retention.  Severe pharyngeal dysphagia c/b severely impaired pharyngeal motility due to edema from current illness.  Pharyngeal contraction, tongue base retraction and laryngeal closure impaired allowing severe retention (mixing with severe secretions) and laryngeal penetration/trace aspiration.   Pt has some awareness/sensation to pharyngeal  impairment as she conducts dry swallows without cues. She also largely senses severe frank laryngeal penetration as she reflexively coughs and attempts to expectorate.  Comparing imaging from prior MBS from 2023 reveals severe edema currently. Pt did not conduct head turn as SLP requested to determine if may decrease retention. At this time, recommend NPO with aggressive oral care and frequent oral moisture - tsps of thin water after mouth care and encouraging pt to cough and expectorate as able.  Will follow up for readiness for intake - hopefully as edema dissipates.  Of note, SLP provided oral suctioning following exam and removed viscous secretions coated with barium  - frequent oral care is vital for this pt - RN informed. Factors that may increase risk of adverse event in presence of aspiration [name] 2021): Factors that may increase risk of adverse event in presence of aspiration [name] 2021): Reduced cognitive function; Poor general health and/or compromised immunity; Reduced saliva; Inadequate oral hygiene; Frail or deconditioned; Limited mobility Recommendations/Plan: Swallowing Evaluation Recommendations Swallowing Evaluation Recommendations Recommendations: -- (tsps of thin water after mouth care) Liquid Administration via: Spoon Medication Administration: Via alternative means Supervision: Full supervision/cueing for swallowing strategies Swallowing strategies  : Slow rate; Small bites/sips Postural changes: Position pt fully upright for meals; Stay upright 30-60 min after meals Oral care recommendations: Oral care QID (4x/day) Caregiver Recommendations: Have oral suction available Treatment Plan Treatment Plan Treatment recommendations: Therapy as outlined in treatment plan below Follow-up recommendations: Skilled nursing-short term rehab (<3 hours/day) Functional status assessment: Patient has had a recent decline in their functional status and demonstrates the ability to make significant improvements in function in a reasonable and predictable amount of time. Treatment frequency: Min 1x/week Treatment duration: 1 week Interventions: Aspiration precaution training; Other (comment); Trials of upgraded texture/liquids; Patient/family education; Compensatory techniques Recommendations Recommendations for follow up therapy are one component of a multi-disciplinary discharge planning process, led by the attending physician.  Recommendations may be updated based on patient status, additional functional criteria and insurance authorization. Assessment: Orofacial Exam: Orofacial Exam Oral Cavity: Oral Hygiene: Xerostomia; Other (comment); Dried secretions  Oral Cavity - Dentition: Other (Comment); Dentures, not available Orofacial Anatomy: Other (comment) Oral Motor/Sensory Function: Other (comment) (pt with difficulty sealing her lips - lingual protrusion) Anatomy: No data recorded Boluses Administered: Boluses Administered Boluses Administered: Thin liquids (Level 0); Mildly thick liquids (Level 2, nectar thick)  Oral Impairment Domain: Oral Impairment Domain Lip Closure: Escape progressing to mid-chin Tongue control during bolus hold: Posterior escape of greater than half of bolus Bolus transport/lingual motion: Delayed initiation of tongue motion (oral holding); Slow tongue motion Oral residue: Residue collection on oral structures Location of oral residue : Tongue Initiation of pharyngeal swallow : Pyriform sinuses  Pharyngeal Impairment Domain: Pharyngeal Impairment Domain Soft palate elevation: No bolus between soft palate (SP)/pharyngeal wall (PW) Laryngeal elevation: Partial superior movement of thyroid  cartilage/partial approximation of arytenoids to epiglottic petiole Anterior hyoid excursion: Partial anterior movement Epiglottic movement: Partial inversion Laryngeal vestibule closure: Incomplete, narrow column air/contrast in laryngeal vestibule Pharyngeal stripping wave : Present - diminished Pharyngeal contraction (A/P view only): N/A Pharyngoesophageal segment opening: Partial distention/partial duration, partial obstruction of flow (difficult view due to pt's shoulder blocking view and snap obstructing as well) Tongue base retraction: Wide column of contrast or air between tongue base and PPW Pharyngeal residue: Majority of contrast within or on pharyngeal structures; Minimal to no pharyngeal clearance Location of pharyngeal residue: Tongue base; Pharyngeal wall; Valleculae; Diffuse (>3 areas)  Esophageal Impairment Domain: No data recorded Pill: Pill Consistency administered: -- (DNt) Penetration/Aspiration Scale Score: Penetration/Aspiration Scale  Score 3.  Material enters airway, remains ABOVE vocal cords and not ejected out: Mildly thick liquids (Level 2, nectar thick) 8.  Material enters airway, passes BELOW cords without attempt by patient to eject out (silent aspiration) : Thin liquids (Level 0) Compensatory Strategies: Compensatory Strategies Compensatory strategies: No   General Information: Caregiver present: No  Diet Prior to this Study: NPO   Temperature : Normal   Respiratory Status: WFL   Supplemental O2: None (Room air)   History of Recent Intubation: No  Behavior/Cognition: Alert; Other (Comment) (inconsistently follows directions - at times is resistant) No data recorded Baseline vocal quality/speech: Hypophonia/low volume; Other (comment) (wet) Volitional Cough: Able to elicit Volitional Swallow: Unable to elicit Exam Limitations: Limited visibility; Poor bolus acceptance Goal Planning: Prognosis for improved oropharyngeal function: Good Barriers to Reach Goals: Time post onset No data recorded Patient/Family Stated Goal: None stated, patient is nonverbal Consulted and agree with results and recommendations: Patient; Nurse Pain: Pain Assessment Pain Assessment: Faces Faces Pain Scale: 0 Breathing: 0 Negative Vocalization: 0 Facial Expression: 0 Body Language: 0 Consolability: 0 PAINAD Score: 0 End of Session: Start Time:SLP Start Time (ACUTE ONLY): 1020 Stop Time: SLP Stop Time (ACUTE ONLY): 1040 Time Calculation:SLP Time Calculation (min) (ACUTE ONLY): 20 min Charges: SLP Evaluations $ SLP Speech Visit: 1 Visit SLP Evaluations $BSS Swallow: 1 Procedure SLP visit diagnosis: SLP Visit Diagnosis: Dysphagia, oropharyngeal phase (R13.12) Past Medical History: Past Medical History: Diagnosis Date  Anxiety   Arthritis   Chronic kidney disease   Hypertension   Hypokalemia   Hyponatremia   Hypothyroidism  Past Surgical History: Past Surgical History: Procedure Laterality Date  SHOULDER SURGERY    WRIST SURGERY   Madelin POUR, MS [facility] SLP Acute Rehab  Services Office [phone] Nicolas Emmie Caldron 05/16/2023, 2:16 PM       Scheduled Meds:  docusate sodium   100 mg Oral BID   heparin  injection (subcutaneous)  5,000 Units Subcutaneous Q8H   levothyroxine   112 mcg Oral Q0600   methylPREDNISolone  (SOLU-MEDROL ) injection  40 mg Intravenous Q24H   Continuous Infusions:  sodium chloride  100 mL/hr at 05/17/23 1318   ampicillin -sulbactam (UNASYN ) IV     linezolid  (ZYVOX ) IV 600 mg (05/17/23 1000)   potassium chloride      thiamine  (VITAMIN B1) injection 500 mg (05/16/23 1829)     LOS: 2 days    Time spent: 35 minutes    Kenzlei Runions A Jaleisa Brose, MD [facility]   If 7PM-7AM, please contact night-coverage [email or web address]  05/17/2023, 2:27 PM

## 2023-05-17 NOTE — Progress Notes (Signed)
 PHARMACY NOTE:  ANTIMICROBIAL RENAL DOSAGE ADJUSTMENT  Current antimicrobial regimen includes a mismatch between antimicrobial dosage and estimated renal function.  As per policy approved by the Pharmacy & Therapeutics and Medical Executive Committees, the antimicrobial dosage will be adjusted accordingly.  Current antimicrobial dosage:  Unasyn  3g IV q24h  Indication: Sialoadenitis  Renal Function: Estimated Creatinine Clearance: 20.6 mL/min (A) (by C-G formula based on SCr of 1.12 mg/dL (H)). []      On intermittent HD, scheduled: []      On CRRT    Antimicrobial dosage has been changed to:  Unasyn  3g IV q12h  Additional comments:   Thank you for allowing pharmacy to be a part of this patient's care.  Wanda Hasting PharmD, BCPS [facility] main pharmacy [phone] 05/17/2023 12:52 PM

## 2023-05-17 NOTE — Progress Notes (Signed)
 Patient ID: SAMEERAH NACHTIGAL, female   DOB: [birth year], 89 y.o.   MRN: [id number]    I discussed the current infection of the parotid and submandibular glands with the patient's son Ron.  We discussed whether information gained from a CT scan or MRI showing an abscess would result in the desire to proceed with any intervention.  If the decision is already made that no further intervention will be considered then there is probably not a good reason to put her through another CT scan or MRI scan.  The son agrees that most likely she would not want to proceed with any surgery at this point but he wants to discuss this with his sister and brother.  He did state that it may take the rest of today to even get a hold of them.  He understands that holding off on evaluation for right now does have some risk of worsening of the problem and airway issues.  Also further infection.  It is typically the case that sialadenitis does not abscess and medical therapy is generally the treatment of choice so that is what we are currently managing her problem with.  He will give me a call back once he has discussed with the family and I did offer a conference call if that would be helpful.

## 2023-05-18 DIAGNOSIS — K1120 Sialoadenitis, unspecified: Secondary | ICD-10-CM | POA: Diagnosis not present

## 2023-05-18 LAB — BASIC METABOLIC PANEL
Anion gap: 12 (ref 5–15)
BUN: 34 mg/dL — ABNORMAL HIGH (ref 8–23)
CO2: 19 mmol/L — ABNORMAL LOW (ref 22–32)
Calcium: 8.1 mg/dL — ABNORMAL LOW (ref 8.9–10.3)
Chloride: 114 mmol/L — ABNORMAL HIGH (ref 98–111)
Creatinine, Ser: 1.04 mg/dL — ABNORMAL HIGH (ref 0.44–1.00)
GFR, Estimated: 48 mL/min — ABNORMAL LOW (ref >60)
Glucose, Bld: 122 mg/dL — ABNORMAL HIGH (ref 70–99)
Potassium: 3.4 mmol/L — ABNORMAL LOW (ref 3.5–5.1)
Sodium: 145 mmol/L (ref 135–145)

## 2023-05-18 LAB — CBC
HCT: 28.1 % — ABNORMAL LOW (ref 36.0–46.0)
Hemoglobin: 8.1 g/dL — ABNORMAL LOW (ref 12.0–15.0)
MCH: 23.8 pg — ABNORMAL LOW (ref 26.0–34.0)
MCHC: 28.8 g/dL — ABNORMAL LOW (ref 30.0–36.0)
MCV: 82.6 fL (ref 80.0–100.0)
Platelets: 273 10*3/uL (ref 150–400)
RBC: 3.4 MIL/uL — ABNORMAL LOW (ref 3.87–5.11)
RDW: 17.9 % — ABNORMAL HIGH (ref 11.5–15.5)
WBC: 7.3 10*3/uL (ref 4.0–10.5)
nRBC: 0 % (ref 0.0–0.2)

## 2023-05-18 LAB — IRON AND TIBC
Iron: 19 ug/dL — ABNORMAL LOW (ref 28–170)
Saturation Ratios: 11 % (ref 10.4–31.8)
TIBC: 174 ug/dL — ABNORMAL LOW (ref 250–450)
UIBC: 155 ug/dL

## 2023-05-18 LAB — RETICULOCYTES
Immature Retic Fract: 15.5 % (ref 2.3–15.9)
RBC.: 3.44 MIL/uL — ABNORMAL LOW (ref 3.87–5.11)
Retic Count, Absolute: 35.4 10*3/uL (ref 19.0–186.0)
Retic Ct Pct: 1 % (ref 0.4–3.1)

## 2023-05-18 LAB — VITAMIN B12: Vitamin B-12: 260 pg/mL (ref 180–914)

## 2023-05-18 LAB — FERRITIN: Ferritin: 32 ng/mL (ref 11–307)

## 2023-05-18 LAB — FOLATE: Folate: 7.6 ng/mL (ref >5.9)

## 2023-05-18 MED ORDER — FOLIC ACID 5 MG/ML IJ SOLN
1.0000 mg | Freq: Once | INTRAMUSCULAR | Status: AC
  Administered 2023-05-18: 1 mg via INTRAVENOUS
  Filled 2023-05-18: qty 0.2

## 2023-05-18 MED ORDER — ORAL CARE MOUTH RINSE
15.0000 mL | OROMUCOSAL | Status: DC
  Administered 2023-05-18 – 2023-05-24 (×24): 15 mL via OROMUCOSAL

## 2023-05-18 MED ORDER — DEXTROSE 5 % IV SOLN: INTRAVENOUS | Status: AC

## 2023-05-18 MED ORDER — ORAL CARE MOUTH RINSE: 15.0000 mL | OROMUCOSAL | Status: DC | PRN

## 2023-05-18 MED ORDER — POTASSIUM CHLORIDE 10 MEQ/100ML IV SOLN
10.0000 meq | INTRAVENOUS | Status: AC
  Administered 2023-05-18 (×3): 10 meq via INTRAVENOUS
  Filled 2023-05-18 (×3): qty 100

## 2023-05-18 MED ORDER — CYANOCOBALAMIN 1000 MCG/ML IJ SOLN
1000.0000 ug | Freq: Every day | INTRAMUSCULAR | Status: AC
  Administered 2023-05-18 – 2023-05-22 (×5): 1000 ug via INTRAMUSCULAR
  Filled 2023-05-18 (×5): qty 1

## 2023-05-18 NOTE — Progress Notes (Signed)
 Patient ID: Bethany Blake, female   DOB: March 05, 1922, 89 y.o.   MRN: [id number]  She seems better. She is more awake and responsive. The submandibular gland is not as large and far less tender. The parotid is still firm but less tender. The floor of mouth still with edema.   I think she is making some progress so will hold on further workup for today.

## 2023-05-18 NOTE — Plan of Care (Signed)

## 2023-05-18 NOTE — Progress Notes (Signed)
 PROGRESS NOTE    Bethany Blake  FMW:[id number] DOB: [birth year] DOA: 05/15/2023 PCP: Onita Rush, MD   Brief Narrative: 89 year old with past medical history significant for CKD, hypertension, advanced dementia admitted to the hospital with sialoadenitis.  History is somewhat limited due to patient being nonverbal, resident of [facility] nursing facility.  Family lives out of state, ER provider spoke with her son over the phone.  Patient presented with dehydration, hyponatremia, AKI and sialoadenitis.     Assessment & Plan:   Principal Problem:   Sialoadenitis  1-Sialoadenitis.  -CT head, neck: Asymmetric soft tissue swelling around the right parotid gland,right submandibular gland, and in the subcutaneous soft tissues of the right face. Findings are worrisome for a sialoadenitis. No evidence of a sialolith -Continue with Linezolid  to cover for MRSA, continue with Unasyn  -Discussed with Dr. Roark, we could try IV steroid.  Unable to use NSAID.  Continue with intermittent gland massage and heat. -Appreciate Dr Roark follow up. Plan to continue with current management  -IV solumedrol 40 mg IV daily.  -IV antibiotics/.   2-Hypernatremia: In the setting of dehydration: Continue with IV fluids Improved.   AKI, CKD stage IIIa: Cr peak to 1.7---1.1 In the setting of dehydration continue with IV fluids Improved.   Hypothyroidism: Continue with Synthroid   Thrombocytosis likely reactive in the setting of acute infection  Nutrition;  Dysphagia; Speech recommend NPO.  Follow recommendations.  MBS: moderate oral dysphagia with impaired lingual transiting of boluses and decr labial closure with resultant piecemealing and retention. Severe pharyngeal dysphagia c/b severely impaired pharyngeal motility due to edema from current illness.   Dementia;  -per son, she can carry a conversation, recognized family, she has underline dementia.   Anemia; chronic. Check anemia panel in am.   Hypokalemia; replaced   Estimated body mass index is 18.01 kg/m as calculated from the following:   Height as of this encounter: 5' 4 (1.626 m).   Weight as of this encounter: 47.6 kg.   DVT prophylaxis: heparin   Code Status: DNR Family Communication: Son  Disposition Plan:  Status is: Inpatient Remains inpatient appropriate because: management of sialoadenitis.     Consultants:  Dr Roark. Discussed case  Procedures:  none  Antimicrobials:    Subjective: She is alert , right side face looks better, less swelling.    Objective: Vitals:   05/18/23 0451 05/18/23 0508 05/18/23 1233 05/18/23 1248  BP: (!) 178/89 (!) 178/89 (!) 183/61 (!) 170/72  Pulse: 72  62   Resp: 19     Temp: (!) 97.5 F (36.4 C)  97.9 F (36.6 C)   TempSrc: Oral  Oral   SpO2: 94%  98%   Weight:      Height:        Intake/Output Summary (Last 24 hours) at 05/18/2023 1433 Last data filed at 05/18/2023 0313 Gross per 24 hour  Intake 1403.22 ml  Output --  Net 1403.22 ml   Filed Weights   05/15/23 1110  Weight: 47.6 kg    Examination:  General exam:  NAD ENT: Right side face less swollen.  Respiratory system: CTA Cardiovascular system: S 1, S 2 RRR Gastrointestinal system: BS present, soft nt Central nervous system: Alert.  Extremities: no edema  Data Reviewed: I have personally reviewed following labs and imaging studies  CBC: Recent Labs  Lab 05/15/23 1159 05/16/23 0439 05/17/23 0942 05/18/23 0437  WBC 10.4 14.5* 12.5* 7.3  NEUTROABS 9.2*  --   --   --  HGB 11.6* 9.4* 8.0* 8.1*  HCT 39.2 32.8* 27.9* 28.1*  MCV 80.5 82.8 82.5 82.6  PLT 422* 347 284 273   Basic Metabolic Panel: Recent Labs  Lab 05/15/23 1159 05/15/23 2055 05/16/23 0439 05/17/23 0942 05/18/23 0437  NA 147* 149* 150* 147* 145  K 5.2* 4.2 4.0 3.1* 3.4*  CL 107 114* 117* 116* 114*  CO2 24 20* 19* 22 19*  GLUCOSE 119* 115* 103* 83 122*  BUN 56* 50* 49* 43* 34*  CREATININE 1.78* 1.58* 1.53* 1.12*  1.04*  CALCIUM 9.4 8.4* 8.5* 8.3* 8.1*  MG  --   --   --  2.0  --    GFR: Estimated Creatinine Clearance: 22.2 mL/min (A) (by C-G formula based on SCr of 1.04 mg/dL (H)). Liver Function Tests: Recent Labs  Lab 05/15/23 1159  AST 17  ALT 8  ALKPHOS 55  BILITOT 1.1  PROT 7.8  ALBUMIN 3.3*   No results for input(s): LIPASE, AMYLASE in the last 168 hours. No results for input(s): AMMONIA in the last 168 hours. Coagulation Profile: No results for input(s): INR, PROTIME in the last 168 hours. Cardiac Enzymes: No results for input(s): CKTOTAL, CKMB, CKMBINDEX, TROPONINI in the last 168 hours. BNP (last 3 results) No results for input(s): PROBNP in the last 8760 hours. HbA1C: No results for input(s): HGBA1C in the last 72 hours. CBG: No results for input(s): GLUCAP in the last 168 hours. Lipid Profile: No results for input(s): CHOL, HDL, LDLCALC, TRIG, CHOLHDL, LDLDIRECT in the last 72 hours. Thyroid  Function Tests: No results for input(s): TSH, T4TOTAL, FREET4, T3FREE, THYROIDAB in the last 72 hours. Anemia Panel: Recent Labs    05/18/23 0437  VITAMINB12 260  FOLATE 7.6  FERRITIN 32  TIBC 174*  IRON 19*  RETICCTPCT 1.0   Sepsis Labs: No results for input(s): PROCALCITON, LATICACIDVEN in the last 168 hours.  No results found for this or any previous visit (from the past 240 hours).       Radiology Studies: No results found.       Scheduled Meds:  cyanocobalamin   1,000 mcg Intramuscular Daily   docusate sodium   100 mg Oral BID   heparin  injection (subcutaneous)  5,000 Units Subcutaneous Q8H   levothyroxine   112 mcg Oral Q0600   methylPREDNISolone  (SOLU-MEDROL ) injection  40 mg Intravenous Q24H   Continuous Infusions:  ampicillin -sulbactam (UNASYN ) IV 3 g (05/18/23 1320)   linezolid  (ZYVOX ) IV Stopped (05/18/23 0034)   thiamine  (VITAMIN B1) injection Stopped (05/17/23 1850)     LOS: 3 days    Time  spent: 35 minutes    Saara Kijowski A Cassadie Pankonin, MD [facility]   If 7PM-7AM, please contact night-coverage [email or web address]  05/18/2023, 2:33 PM

## 2023-05-18 NOTE — Progress Notes (Addendum)
 Speech Language Pathology Treatment: Dysphagia  Patient Details Name: Bethany Blake MRN: [id number] DOB: 1921-11-10 Today's Date: 05/18/2023 Time: 1123-1219 SLP Time Calculation (min) (ACUTE ONLY): 56 min  Assessment / Plan / Recommendation Clinical Impression  Per MD note Bethany Blake is a 89 y.o. female with medical history significant for CKD, hypertension, advanced dementia being admitted to the hospital with sialoadenitis and fall . She is somewhat limited as the patient is nonverbal, she is a resident of [facility] nursing facility. Her son and daughter both live out of state, ER provider spoke with her son over the phone. Workup as detailed below shows evidence of dehydration, hypernatremia, AKI, and sialoadenitis. Patient has been started on IV antibiotics, given IV fluids and admitted to the hospitalist service. Patient has prior medical history of dysphagia diagnosed October 2023 with recommendations of dysphagia 2 and thin liquid at that time with strict precautions. Past medical history also includes COVID. MD was in touch with Dr. Roark ENT who recommended IV antibiotics, intermittent gland massage, heat and NSAIDs.   MBS completed on 2/5 revealing severe dysphagia - with recommendation of npo x water and clinical follow up for dysphagia management, determine readiness for po diet.     HPI      Patient seen to assess readiness for p.o. intake.  Patient greeted in bed severe xerostomia with green-tinged viscous coating on roof of mouth and on denture.  SLP removed denture and cleaned and then provided significant amount of oral care using suction, moist Toothette's and water.  Oral care required approximately 25 minutes to complete as patient tolerated well but needed extra time.  SLP within provided patient with intake including single ice chip and teaspoons of water.  Open mouth posture noted with some swallowing suspect due to edema, multiple swallows with each bolus and after patient had  minimal intake she was noted to cough significantly with wet voice.  After that she declined to consume any more intake stating that is enough.  She indicated she felt water was going the wrong way  - causing her to decline more intake.    Son reports pt had been having some poor intake prior to admission.   She is verbalizing her wishes to go home repeatedly during session.  Patient's speech clarity was much improved after oral care. Requested Bethany Blake to take her dentures with him, but he preferred to leave them here so they go with her to facility when she discharges. Do not recommend to replace them.     Subjectively patient appears with less parotid gland edema than 2 days prior but still not baseline and palpated area is still hard to touch.    Recommend frequent and adequate oral care followed by providing patient with teaspoons of water and single small ice chips frequently offered.  Son Bethany Blake present and SLP advised him to recommendations using teach back.  Bethany Blake was educated in providing patient water intake with purpose being oral care/hygiene, comfort  and to decrease disuse muscle atrophy.  Son reported gratitude to SLP.  Relayed information/recommendation's to pt's RN, Bethany Blake.     Will follow for p.o. readiness, including calling over this weekend to determine if pt has improved enough for SLP follow up.  Pt has no alternative means of nutrition.     SLP Plan  Continue with current plan of care      Recommendations for follow up therapy are one component of a multi-disciplinary discharge planning process, led by the attending  physician.  Recommendations may be updated based on patient status, additional functional criteria and insurance authorization.    Recommendations  Diet recommendations: NPO (Frequent water intake) Liquids provided via: Teaspoon (Tip of straw) Medication Administration: Via alternative means Supervision: Trained caregiver to feed patient;Full supervision/cueing for  compensatory strategies Compensations: Slow rate;Multiple dry swallows after each bite/sip Postural Changes and/or Swallow Maneuvers: Seated upright 90 degrees                  Oral care prior to ice chip/H20     Dysphagia, oropharyngeal phase (R13.12)     Continue with current plan of care    Bethany POUR, MS [facility] SLP Acute Rehab Services Office [phone]  Bethany Blake  05/18/2023, 12:33 PM

## 2023-05-18 NOTE — Progress Notes (Signed)
 Physical Therapy Treatment Patient Details Name: Bethany Blake MRN: [id number] DOB: [birth year] Today's Date: 05/18/2023   History of Present Illness 89 yo female admitted with sialoadenitis. Hx of dementia, frequent falls, CKD, hyponatremia, hypothyroidism    PT Comments  Pt seen for PT tx with pt received in bed. Pt reporting L wrist pain & L elbow appearing swollen - nurse made aware. Pt initially agreeable to sitting EOB but then becomes fearful of attempts. Pt requires max<>total assist for supine<>sit & max<>total assist for static sitting EOB as pt pushing posteriorly & c/o fear of falling despite PT attempting to reassure pt. PT assisted pt back to bed & encouraged/educated pt on technique for scooting to [facility] but pt unable to push with BLE. Pt left in bed with call bell in reach.     If plan is discharge home, recommend the following: Two people to help with walking and/or transfers;Two people to help with bathing/dressing/bathroom;Assistance with feeding   Can travel by private vehicle        Equipment Recommendations  None recommended by PT    Recommendations for Other Services       Precautions / Restrictions Precautions Precautions: Fall Restrictions Weight Bearing Restrictions Per Provider Order: No     Mobility  Bed Mobility Overal bed mobility: Needs Assistance Bed Mobility: Supine to Sit, Sit to Supine     Supine to sit: Max assist, Total assist, HOB elevated, Used rails Sit to supine: Total assist, Max assist, HOB elevated, Used rails   General bed mobility comments: max<>total assist to scoot to [facility] with bed in trendelenburg  position, pt able to flex BLE knees but does not push to assist with scooting.    Transfers                        Ambulation/Gait                   Stairs             Wheelchair Mobility     Tilt Bed    Modified Rankin (Stroke Patients Only)       Balance Overall balance assessment: Needs  assistance Sitting-balance support: Feet unsupported, No upper extremity supported Sitting balance-Leahy Scale: Zero   Postural control: Posterior lean                                  Cognition Arousal: Alert Behavior During Therapy: Flat affect, Anxious Overall Cognitive Status: Impaired/Different from baseline                                 General Comments: Pt with decreased ability to follow simple cues with extra time. Pt anxious, voices fear of falling.        Exercises      General Comments        Pertinent Vitals/Pain Pain Assessment Pain Assessment: PAINAD Breathing: normal Negative Vocalization: occasional moan/groan, low speech, negative/disapproving quality Facial Expression: smiling or inexpressive Body Language: relaxed Consolability: distracted or reassured by voice/touch PAINAD Score: 2    Home Living                          Prior Function            PT Goals (current goals can  now be found in the care plan section) Acute Rehab PT Goals PT Goal Formulation: Patient unable to participate in goal setting Time For Goal Achievement: 05/30/23 Potential to Achieve Goals: Poor Progress towards PT goals: Progressing toward goals    Frequency           PT Plan      Co-evaluation              AM-PAC PT 6 Clicks Mobility   Outcome Measure  Help needed turning from your back to your side while in a flat bed without using bedrails?: Total Help needed moving from lying on your back to sitting on the side of a flat bed without using bedrails?: Total Help needed moving to and from a bed to a chair (including a wheelchair)?: Total Help needed standing up from a chair using your arms (e.g., wheelchair or bedside chair)?: Total Help needed to walk in hospital room?: Total Help needed climbing 3-5 steps with a railing? : Total 6 Click Score: 6    End of Session   Activity Tolerance:  (pt self  limiting 2/2 fear of falling) Patient left: in bed;with call bell/phone within reach;with bed alarm set Nurse Communication:  (pt c/o L wrist hurting & L elbow edema) PT Visit Diagnosis: Muscle weakness (generalized) (M62.81);History of falling (Z91.81);Repeated falls (R29.6);Difficulty in walking, not elsewhere classified (R26.2);Other abnormalities of gait and mobility (R26.89)     Time: 8686-8676 PT Time Calculation (min) (ACUTE ONLY): 10 min  Charges:    $Therapeutic Activity: 8-22 mins PT General Charges $$ ACUTE PT VISIT: 1 Visit                     Richerd Pinal, PT, DPT 05/18/23, 1:33 PM   Richerd CHRISTELLA Pinal 05/18/2023, 1:32 PM

## 2023-05-18 NOTE — TOC Initial Note (Signed)
 Transition of Care [facility]) - Initial/Assessment Note    Patient Details  Name: Bethany Blake MRN: [id number] Date of Birth: Oct 23, 1921  Transition of Care [facility]) CM/SW Contact:    Luann SHAUNNA Cumming, LCSW Phone Number: 05/18/2023, 11:08 AM  Clinical Narrative:                  OT spoke with Rehab Director at pt's facility, [facility], and noted that pt would benefit from returning to a familiar environment. [facility] cannot accept pt over the weekend; MD aware. CSW spoke with pt's son who is agreeable to plan for pt to return to [facility] at [place] and  receive therapies there. CSW called [facility] and faxed progress notes to [phone].    Expected Discharge Plan: Memory Care Barriers to Discharge: Continued Medical Work up   Patient Goals and CMS Choice            Expected Discharge Plan and Services       Living arrangements for the past 2 months: Assisted Living Facility                                      Prior Living Arrangements/Services Living arrangements for the past 2 months: Assisted Living Facility Lives with:: Facility Resident                   Activities of Daily Living   ADL Screening (condition at time of admission) Independently performs ADLs?: No Does the patient have a NEW difficulty with bathing/dressing/toileting/self-feeding that is expected to last >3 days?: No Does the patient have a NEW difficulty with getting in/out of bed, walking, or climbing stairs that is expected to last >3 days?: No Does the patient have a NEW difficulty with communication that is expected to last >3 days?: Yes (Initiates electronic notice to provider for possible SLP consult) Is the patient deaf or have difficulty hearing?: Yes Does the patient have difficulty seeing, even when wearing glasses/contacts?: Yes Does the patient have difficulty concentrating, remembering, or making decisions?: Yes  Permission Sought/Granted                  Emotional  Assessment       Orientation: : Oriented to Self Alcohol  / Substance Use: Not Applicable Psych Involvement: No (comment)  Admission diagnosis:  Sialoadenitis [K11.20] AKI (acute kidney injury) (HCC) [N17.9] Patient Active Problem List   Diagnosis Date Noted   Sialoadenitis 05/15/2023   Aspiration pneumonia (HCC) 01/15/2022   Pneumonia due to COVID-19 virus 01/14/2022   COVID 01/14/2022   Syncope 06/19/2021   Normocytic anemia 06/19/2021   Stage 3a chronic kidney disease (CKD) (HCC) 06/19/2021   Subdural hematoma (HCC) 06/19/2021   Ulcer of heel and midfoot (HCC) 02/27/2020   E-coli UTI    Acute encephalopathy 01/23/2020   Urinary tract infection without hematuria    Bilateral subdural hematomas (HCC) 12/31/2019   Subarachnoid hemorrhage (HCC) 12/31/2019   Hypothyroidism 12/31/2019   Fall at home, initial encounter 12/31/2019   Aneurysm of right internal carotid artery 12/31/2019   Physical deconditioning 09/02/2014   SIADH (syndrome of inappropriate ADH production) (HCC) 08/28/2014   Essential hypertension 08/28/2014   Confusion 08/28/2014   AKI (acute kidney injury) (HCC) 08/28/2014   Hypochloremia 08/28/2014   PCP:  Onita Rush, MD Pharmacy:  No Pharmacies Listed    Social Drivers of Health (SDOH) Social History:  SDOH Screenings   Tobacco Use: Medium Risk (05/15/2023)   SDOH Interventions:     Readmission Risk Interventions     No data to display

## 2023-05-19 DIAGNOSIS — K1120 Sialoadenitis, unspecified: Secondary | ICD-10-CM | POA: Diagnosis not present

## 2023-05-19 LAB — CBC
HCT: 27.2 % — ABNORMAL LOW (ref 36.0–46.0)
Hemoglobin: 8 g/dL — ABNORMAL LOW (ref 12.0–15.0)
MCH: 23.2 pg — ABNORMAL LOW (ref 26.0–34.0)
MCHC: 29.4 g/dL — ABNORMAL LOW (ref 30.0–36.0)
MCV: 78.8 fL — ABNORMAL LOW (ref 80.0–100.0)
Platelets: 285 10*3/uL (ref 150–400)
RBC: 3.45 MIL/uL — ABNORMAL LOW (ref 3.87–5.11)
RDW: 17.8 % — ABNORMAL HIGH (ref 11.5–15.5)
WBC: 7.3 10*3/uL (ref 4.0–10.5)
nRBC: 0 % (ref 0.0–0.2)

## 2023-05-19 LAB — BASIC METABOLIC PANEL
Anion gap: 7 (ref 5–15)
BUN: 34 mg/dL — ABNORMAL HIGH (ref 8–23)
CO2: 21 mmol/L — ABNORMAL LOW (ref 22–32)
Calcium: 8.1 mg/dL — ABNORMAL LOW (ref 8.9–10.3)
Chloride: 108 mmol/L (ref 98–111)
Creatinine, Ser: 1.1 mg/dL — ABNORMAL HIGH (ref 0.44–1.00)
GFR, Estimated: 45 mL/min — ABNORMAL LOW (ref >60)
Glucose, Bld: 172 mg/dL — ABNORMAL HIGH (ref 70–99)
Potassium: 3.8 mmol/L (ref 3.5–5.1)
Sodium: 136 mmol/L (ref 135–145)

## 2023-05-19 LAB — GLUCOSE, CAPILLARY: Glucose-Capillary: 160 mg/dL — ABNORMAL HIGH (ref 70–99)

## 2023-05-19 MED ORDER — FOLIC ACID 5 MG/ML IJ SOLN
1.0000 mg | Freq: Every day | INTRAMUSCULAR | Status: DC
  Administered 2023-05-19 – 2023-05-23 (×5): 1 mg via INTRAVENOUS
  Filled 2023-05-19 (×5): qty 0.2

## 2023-05-19 MED ORDER — DEXTROSE 5 % IV SOLN
INTRAVENOUS | Status: AC
Start: 2023-05-19 — End: 2023-05-20

## 2023-05-19 MED ORDER — SODIUM CHLORIDE 0.9 % IV SOLN: 1.0000 mg | Freq: Every day | INTRAVENOUS | Status: DC

## 2023-05-19 NOTE — Plan of Care (Signed)

## 2023-05-19 NOTE — Progress Notes (Signed)
 PROGRESS NOTE    Bethany Blake  FMW:[id number] DOB: [birth year] DOA: 05/15/2023 PCP: Onita Rush, MD   Brief Narrative: 89 year old with past medical history significant for CKD, hypertension, advanced dementia admitted to the hospital with sialoadenitis.  History is somewhat limited due to patient being nonverbal, resident of [facility] nursing facility.  Family lives out of state, ER provider spoke with her son over the phone.  Patient presented with dehydration, hyponatremia, AKI and sialoadenitis.     Assessment & Plan:   Principal Problem:   Sialoadenitis  1-Sialoadenitis.  -CT head, neck: Asymmetric soft tissue swelling around the right parotid gland,right submandibular gland, and in the subcutaneous soft tissues of the right face. Findings are worrisome for a sialoadenitis. No evidence of a sialolith -Continue with Linezolid  to cover for MRSA, continue with Unasyn  -Discussed with Dr. Roark, we could try IV steroid.  Unable to use NSAID.  Continue with intermittent gland massage and heat. -Appreciate Dr Roark follow up. Plan to continue with current management  -IV solumedrol 40 mg IV daily.  -IV antibiotics/.  Swelling seems stable today. Plan to continue current management  2-Hypernatremia: In the setting of dehydration: Continue with IV fluids Resolved.    AKI, CKD stage IIIa: Cr peak to 1.7---1.1 In the setting of dehydration continue with IV fluids Resolved.   Hypothyroidism: Continue with Synthroid   Thrombocytosis likely reactive in the setting of acute infection  Nutrition;  Dysphagia; Speech recommend NPO.  Follow recommendations.  MBS: moderate oral dysphagia with impaired lingual transiting of boluses and decr labial closure with resultant piecemealing and retention. Severe pharyngeal dysphagia c/b severely impaired pharyngeal motility due to edema from current illness.   Dementia;  -per son, she can carry a conversation, recognized family, she has underline  dementia.   Anemia; chronic. Iron deficiency.  Low normal B12/  She will need iron when tolerates diet.  Started on B 12 supplement. Folic acid .   Hypokalemia; replaced   Estimated body mass index is 18.01 kg/m as calculated from the following:   Height as of this encounter: 5' 4 (1.626 m).   Weight as of this encounter: 47.6 kg.   DVT prophylaxis: heparin   Code Status: DNR Family Communication: Son  Disposition Plan:  Status is: Inpatient Remains inpatient appropriate because: management of sialoadenitis.     Consultants:  Dr Roark. Discussed case  Procedures:  none  Antimicrobials:    Subjective: Alert, swelling face maybe  better than yesterday.    Objective: Vitals:   05/18/23 1532 05/18/23 2110 05/19/23 0406 05/19/23 1223  BP: (!) 140/60 (!) 143/79 (!) 145/75 (!) 149/54  Pulse:  74 63 65  Resp:  20 20 18   Temp:  98.4 F (36.9 C) 98.4 F (36.9 C) 97.9 F (36.6 C)  TempSrc:    Oral  SpO2:  97% 95% 97%  Weight:      Height:        Intake/Output Summary (Last 24 hours) at 05/19/2023 1329 Last data filed at 05/19/2023 0415 Gross per 24 hour  Intake 2689.39 ml  Output 500 ml  Net 2189.39 ml   Filed Weights   05/15/23 1110  Weight: 47.6 kg    Examination:  General exam: NAD ENT: Right side face less swollen.  Respiratory system: CTA Cardiovascular system: S 1, S 2 RRR Gastrointestinal system: BS present, soft, nt   Data Reviewed: I have personally reviewed following labs and imaging studies  CBC: Recent Labs  Lab 05/15/23 1159 05/16/23 0439 05/17/23 9057  05/18/23 0437 05/19/23 1000  WBC 10.4 14.5* 12.5* 7.3 7.3  NEUTROABS 9.2*  --   --   --   --   HGB 11.6* 9.4* 8.0* 8.1* 8.0*  HCT 39.2 32.8* 27.9* 28.1* 27.2*  MCV 80.5 82.8 82.5 82.6 78.8*  PLT 422* 347 284 273 285   Basic Metabolic Panel: Recent Labs  Lab 05/15/23 2055 05/16/23 0439 05/17/23 0942 05/18/23 0437 05/19/23 1000  NA 149* 150* 147* 145 136  K 4.2 4.0 3.1* 3.4*  3.8  CL 114* 117* 116* 114* 108  CO2 20* 19* 22 19* 21*  GLUCOSE 115* 103* 83 122* 172*  BUN 50* 49* 43* 34* 34*  CREATININE 1.58* 1.53* 1.12* 1.04* 1.10*  CALCIUM 8.4* 8.5* 8.3* 8.1* 8.1*  MG  --   --  2.0  --   --    GFR: Estimated Creatinine Clearance: 20.9 mL/min (A) (by C-G formula based on SCr of 1.1 mg/dL (H)). Liver Function Tests: Recent Labs  Lab 05/15/23 1159  AST 17  ALT 8  ALKPHOS 55  BILITOT 1.1  PROT 7.8  ALBUMIN 3.3*   No results for input(s): LIPASE, AMYLASE in the last 168 hours. No results for input(s): AMMONIA in the last 168 hours. Coagulation Profile: No results for input(s): INR, PROTIME in the last 168 hours. Cardiac Enzymes: No results for input(s): CKTOTAL, CKMB, CKMBINDEX, TROPONINI in the last 168 hours. BNP (last 3 results) No results for input(s): PROBNP in the last 8760 hours. HbA1C: No results for input(s): HGBA1C in the last 72 hours. CBG: No results for input(s): GLUCAP in the last 168 hours. Lipid Profile: No results for input(s): CHOL, HDL, LDLCALC, TRIG, CHOLHDL, LDLDIRECT in the last 72 hours. Thyroid  Function Tests: No results for input(s): TSH, T4TOTAL, FREET4, T3FREE, THYROIDAB in the last 72 hours. Anemia Panel: Recent Labs    05/18/23 0437  VITAMINB12 260  FOLATE 7.6  FERRITIN 32  TIBC 174*  IRON 19*  RETICCTPCT 1.0   Sepsis Labs: No results for input(s): PROCALCITON, LATICACIDVEN in the last 168 hours.  No results found for this or any previous visit (from the past 240 hours).       Radiology Studies: No results found.       Scheduled Meds:  cyanocobalamin   1,000 mcg Intramuscular Daily   docusate sodium   100 mg Oral BID   heparin  injection (subcutaneous)  5,000 Units Subcutaneous Q8H   levothyroxine   112 mcg Oral Q0600   methylPREDNISolone  (SOLU-MEDROL ) injection  40 mg Intravenous Q24H   mouth rinse  15 mL Mouth Rinse 4 times per day   Continuous  Infusions:  ampicillin -sulbactam (UNASYN ) IV 3 g (05/19/23 1013)   dextrose  75 mL/hr at 05/19/23 0348   linezolid  (ZYVOX ) IV 600 mg (05/19/23 1058)   thiamine  (VITAMIN B1) injection Stopped (05/18/23 1753)     LOS: 4 days    Time spent: 35 minutes    Wenona [place] A Madeline Bebout, MD [facility]   If 7PM-7AM, please contact night-coverage [email or web address]  05/19/2023, 1:29 PM

## 2023-05-20 DIAGNOSIS — K1120 Sialoadenitis, unspecified: Secondary | ICD-10-CM | POA: Diagnosis not present

## 2023-05-20 LAB — CBC
HCT: 28.4 % — ABNORMAL LOW (ref 36.0–46.0)
Hemoglobin: 8.2 g/dL — ABNORMAL LOW (ref 12.0–15.0)
MCH: 23.2 pg — ABNORMAL LOW (ref 26.0–34.0)
MCHC: 28.9 g/dL — ABNORMAL LOW (ref 30.0–36.0)
MCV: 80.2 fL (ref 80.0–100.0)
Platelets: 268 10*3/uL (ref 150–400)
RBC: 3.54 MIL/uL — ABNORMAL LOW (ref 3.87–5.11)
RDW: 17.7 % — ABNORMAL HIGH (ref 11.5–15.5)
WBC: 6.1 10*3/uL (ref 4.0–10.5)
nRBC: 0 % (ref 0.0–0.2)

## 2023-05-20 LAB — BASIC METABOLIC PANEL
Anion gap: 8 (ref 5–15)
BUN: 26 mg/dL — ABNORMAL HIGH (ref 8–23)
CO2: 21 mmol/L — ABNORMAL LOW (ref 22–32)
Calcium: 7.9 mg/dL — ABNORMAL LOW (ref 8.9–10.3)
Chloride: 105 mmol/L (ref 98–111)
Creatinine, Ser: 0.92 mg/dL (ref 0.44–1.00)
GFR, Estimated: 56 mL/min — ABNORMAL LOW (ref >60)
Glucose, Bld: 170 mg/dL — ABNORMAL HIGH (ref 70–99)
Potassium: 3.6 mmol/L (ref 3.5–5.1)
Sodium: 134 mmol/L — ABNORMAL LOW (ref 135–145)

## 2023-05-20 NOTE — Plan of Care (Signed)

## 2023-05-20 NOTE — Progress Notes (Signed)
 Speech Language Pathology Treatment: Dysphagia  Patient Details Name: Bethany Blake MRN: [id number] DOB: [birth year] Today's Date: 05/20/2023 Time: 1455-1510 SLP Time Calculation (min) (ACUTE ONLY): 15 min  Assessment / Plan / Recommendation Clinical Impression  Patient seen by SLP for skilled treatment focused on dysphagia goals. When SLP entered room, patient was awake, alert and receptive to session. SLP inspected her oral mucosa which was significantly dry and with some dried secretions seen in posterior portion of oral cavity. She is severely dysarthric with very limited movement of tongue, which is bulbous in appearance. SLP was able to understand less than 20% of what she was trying to say. SLP performed oral care, at first just soaking toothette sponge in water and using it to moisten her oral mucosa. She would suck some of the water off of sponge. Patient would not allow for SLP to provide oral care in posterior portion of her oral cavity and so no secretions were cleared/removed. She then accepted a few small (1/4 spoon) sips of water. She exhibited a mildly delayed cough response after sips, which was not productive but was very brief in duration. Patient started to refuse further PO's of water. SLP recommending to continue with NPO except water via spoon sips PRN after oral care. SLP will continue to follow.    HPI HPI: Per MD note Bethany Blake is a 89 y.o. female with medical history significant for CKD, hypertension, advanced dementia being admitted to the hospital with sialoadenitis and fall .  She is somewhat limited as the patient is nonverbal, she is a resident of [facility] nursing facility.  Her son and daughter both live out of state, ER provider spoke with her son over the phone.  Workup as detailed below shows evidence of dehydration, hypernatremia, AKI, and sialoadenitis.  Patient has been started on IV antibiotics, given IV fluids and admitted to the hospitalist service.  Patient  has prior medical history of dysphagia diagnosed October 2023 with recommendations of dysphagia 2 and thin liquid at that time with strict precautions.  Past medical history also includes COVID.  MD was in touch with Dr. Roark ENT who recommended IV antibiotics, intermittent gland massage, heat and NSAIDs.      SLP Plan  Continue with current plan of care      Recommendations for follow up therapy are one component of a multi-disciplinary discharge planning process, led by the attending physician.  Recommendations may be updated based on patient status, additional functional criteria and insurance authorization.    Recommendations  Diet recommendations: NPO Liquids provided via: Teaspoon Medication Administration: Via alternative means Supervision: Trained caregiver to feed patient;Full supervision/cueing for compensatory strategies Compensations: Slow rate Postural Changes and/or Swallow Maneuvers: Seated upright 90 degrees                  Oral care prior to ice chip/H20;Oral care QID   Frequent or constant Supervision/Assistance Dysphagia, oropharyngeal phase (R13.12)     Continue with current plan of care     Norleen IVAR Blase, MA, CCC-SLP Speech Therapy

## 2023-05-20 NOTE — Progress Notes (Signed)
 PROGRESS NOTE    Bethany Blake  FMW:[id number] DOB: 1921/05/12 DOA: 05/15/2023 PCP: Onita Rush, MD   Brief Narrative: 89 year old with past medical history significant for CKD, hypertension, advanced dementia admitted to the hospital with sialoadenitis.  History is somewhat limited due to patient being nonverbal, resident of [facility] nursing facility.  Family lives out of state, ER provider spoke with her son over the phone.  Patient presented with dehydration, hyponatremia, AKI and sialoadenitis.  Assessment & Plan:   Principal Problem:   Sialoadenitis  1-Sialoadenitis.  -CT head, neck: Asymmetric soft tissue swelling around the right parotid gland,right submandibular gland, and in the subcutaneous soft tissues of the right face. Findings are worrisome for a sialoadenitis. No evidence of a sialolith -Continue with Linezolid  to cover for MRSA, Continue with Unasyn  -Discussed with Dr. Roark, we could try IV steroid.  Unable to use NSAID.  Continue with intermittent gland massage and heat. -Appreciate Dr Roark follow up. Plan to continue with current management  -IV solumedrol 40 mg IV daily.  -IV antibiotics/.  Swelling much better today. Plan to continue with current plan.  Speech to follow up on patient.   2-Hypernatremia: In the setting of dehydration: Continue with IV fluids Resolved.    AKI, CKD stage IIIa: Cr peak to 1.7---1.1 In the setting of dehydration continue with IV fluids Resolved.   Hypothyroidism: Continue with Synthroid   Thrombocytosis likely reactive in the setting of acute infection  Nutrition;  Dysphagia; Speech recommend NPO.  Follow recommendations.  MBS: moderate oral dysphagia with impaired lingual transiting of boluses and decr labial closure with resultant piecemealing and retention. Severe pharyngeal dysphagia c/b severely impaired pharyngeal motility due to edema from current illness.  Would aks speech to follow up on patient.   Dementia;  -per  son, she can carry a conversation, recognized family, she has underline dementia.   Anemia; chronic. Iron deficiency.  Low normal B12/  She will need iron when tolerates diet.  Started on B 12 supplement. Folic acid .   Hypokalemia; replaced   Estimated body mass index is 18.01 kg/m as calculated from the following:   Height as of this encounter: 5' 4 (1.626 m).   Weight as of this encounter: 47.6 kg.   DVT prophylaxis: heparin   Code Status: DNR Family Communication: Son 2/09 Disposition Plan:  Status is: Inpatient Remains inpatient appropriate because: management of sialoadenitis.     Consultants:  Dr Roark. Discussed case  Procedures:  none  Antimicrobials:    Subjective: She is alert, didn't want to drink from spoon water.  Swelling mandibular parotid area better than yesterday,.   Objective: Vitals:   05/19/23 0406 05/19/23 1223 05/19/23 1928 05/20/23 0507  BP: (!) 145/75 (!) 149/54 (!) 156/79 (!) 149/78  Pulse: 63 65 60 61  Resp: 20 18 14 15   Temp: 98.4 F (36.9 C) 97.9 F (36.6 C) 98 F (36.7 C) 98.1 F (36.7 C)  TempSrc:  Oral    SpO2: 95% 97% 97% 96%  Weight:      Height:        Intake/Output Summary (Last 24 hours) at 05/20/2023 0824 Last data filed at 05/20/2023 0500 Gross per 24 hour  Intake 1625.61 ml  Output 500 ml  Net 1125.61 ml   Filed Weights   05/15/23 1110  Weight: 47.6 kg    Examination:  General exam: NAD ENT: Right side face, mandibular, parotid area improving.  Respiratory system: CTA Cardiovascular system: S 1, S 2 RRR Gastrointestinal system:  BS present, soft, nt   Data Reviewed: I have personally reviewed following labs and imaging studies  CBC: Recent Labs  Lab 05/15/23 1159 05/16/23 0439 05/17/23 0942 05/18/23 0437 05/19/23 1000 05/20/23 0511  WBC 10.4 14.5* 12.5* 7.3 7.3 6.1  NEUTROABS 9.2*  --   --   --   --   --   HGB 11.6* 9.4* 8.0* 8.1* 8.0* 8.2*  HCT 39.2 32.8* 27.9* 28.1* 27.2* 28.4*  MCV 80.5  82.8 82.5 82.6 78.8* 80.2  PLT 422* 347 284 273 285 268   Basic Metabolic Panel: Recent Labs  Lab 05/16/23 0439 05/17/23 0942 05/18/23 0437 05/19/23 1000 05/20/23 0511  NA 150* 147* 145 136 134*  K 4.0 3.1* 3.4* 3.8 3.6  CL 117* 116* 114* 108 105  CO2 19* 22 19* 21* 21*  GLUCOSE 103* 83 122* 172* 170*  BUN 49* 43* 34* 34* 26*  CREATININE 1.53* 1.12* 1.04* 1.10* 0.92  CALCIUM 8.5* 8.3* 8.1* 8.1* 7.9*  MG  --  2.0  --   --   --    GFR: Estimated Creatinine Clearance: 25 mL/min (by C-G formula based on SCr of 0.92 mg/dL). Liver Function Tests: Recent Labs  Lab 05/15/23 1159  AST 17  ALT 8  ALKPHOS 55  BILITOT 1.1  PROT 7.8  ALBUMIN 3.3*   No results for input(s): LIPASE, AMYLASE in the last 168 hours. No results for input(s): AMMONIA in the last 168 hours. Coagulation Profile: No results for input(s): INR, PROTIME in the last 168 hours. Cardiac Enzymes: No results for input(s): CKTOTAL, CKMB, CKMBINDEX, TROPONINI in the last 168 hours. BNP (last 3 results) No results for input(s): PROBNP in the last 8760 hours. HbA1C: No results for input(s): HGBA1C in the last 72 hours. CBG: Recent Labs  Lab 05/19/23 1406  GLUCAP 160*   Lipid Profile: No results for input(s): CHOL, HDL, LDLCALC, TRIG, CHOLHDL, LDLDIRECT in the last 72 hours. Thyroid  Function Tests: No results for input(s): TSH, T4TOTAL, FREET4, T3FREE, THYROIDAB in the last 72 hours. Anemia Panel: Recent Labs    05/18/23 0437  VITAMINB12 260  FOLATE 7.6  FERRITIN 32  TIBC 174*  IRON 19*  RETICCTPCT 1.0   Sepsis Labs: No results for input(s): PROCALCITON, LATICACIDVEN in the last 168 hours.  No results found for this or any previous visit (from the past 240 hours).       Radiology Studies: No results found.       Scheduled Meds:  cyanocobalamin   1,000 mcg Intramuscular Daily   docusate sodium   100 mg Oral BID   folic acid   1 mg  Intravenous Daily   heparin  injection (subcutaneous)  5,000 Units Subcutaneous Q8H   levothyroxine   112 mcg Oral Q0600   methylPREDNISolone  (SOLU-MEDROL ) injection  40 mg Intravenous Q24H   mouth rinse  15 mL Mouth Rinse 4 times per day   Continuous Infusions:  ampicillin -sulbactam (UNASYN ) IV Stopped (05/19/23 2156)   dextrose  75 mL/hr at 05/20/23 0426   linezolid  (ZYVOX ) IV Stopped (05/19/23 2315)   thiamine  (VITAMIN B1) injection Stopped (05/19/23 1758)     LOS: 5 days    Time spent: 35 minutes    Mialani Reicks A Oran Dillenburg, MD [facility]   If 7PM-7AM, please contact night-coverage [email or web address]  05/20/2023, 8:24 AM

## 2023-05-21 DIAGNOSIS — K1120 Sialoadenitis, unspecified: Secondary | ICD-10-CM | POA: Diagnosis not present

## 2023-05-21 MED ORDER — LACTATED RINGERS IV SOLN: INTRAVENOUS | Status: AC

## 2023-05-21 MED ORDER — DEXTROSE 5 % IV SOLN: INTRAVENOUS | Status: DC

## 2023-05-21 NOTE — Plan of Care (Signed)

## 2023-05-21 NOTE — Progress Notes (Signed)
 Physical Therapy Treatment Patient Details Name: Bethany Blake MRN: [id number] DOB: [birth year] Today's Date: 05/21/2023   History of Present Illness 89 yo female admitted with sialoadenitis. Hx of dementia, frequent falls, CKD, hyponatremia, hypothyroidism    PT Comments  Max encouragement for participation. Pt resistant and agitated with therapist's attempts to work with her. Pt unwilling to participate. Will continue attempts as able. No family present during session. Per TOC note, family prefers for pt to return to her ALF once medically ready.    If plan is discharge home, recommend the following: Two people to help with walking and/or transfers;Two people to help with bathing/dressing/bathroom;Assistance with feeding   Can travel by private vehicle        Equipment Recommendations  None recommended by PT    Recommendations for Other Services       Precautions / Restrictions Precautions Precautions: Fall Restrictions Weight Bearing Restrictions Per Provider Order: No     Mobility  Bed Mobility               General bed mobility comments: NT-pt refused OOB despite encouragement. Agitated with therapists's attempts. Kept closing her eyes.    Transfers                        Ambulation/Gait                   Stairs             Wheelchair Mobility     Tilt Bed    Modified Rankin (Stroke Patients Only)       Balance                                            Cognition Arousal: Lethargic Behavior During Therapy: Agitated Overall Cognitive Status: No family/caregiver present to determine baseline cognitive functioning                                          Exercises      General Comments        Pertinent Vitals/Pain Pain Assessment Pain Assessment: Faces Faces Pain Scale: No hurt    Home Living                          Prior Function            PT Goals (current  goals can now be found in the care plan section) Progress towards PT goals: Not progressing toward goals - comment    Frequency    Min 1X/week      PT Plan      Co-evaluation              AM-PAC PT "6 Clicks" Mobility   Outcome Measure  Help needed turning from your back to your side while in a flat bed without using bedrails?: Total Help needed moving from lying on your back to sitting on the side of a flat bed without using bedrails?: Total Help needed moving to and from a bed to a chair (including a wheelchair)?: Total Help needed standing up from a chair using your arms (e.g., wheelchair or bedside chair)?: Total Help needed to walk in hospital room?: Total  Help needed climbing 3-5 steps with a railing? : Total 6 Click Score: 6    End of Session   Activity Tolerance: Patient limited by fatigue (limited by cognition) Patient left: in bed;with call bell/phone within reach;with bed alarm set   PT Visit Diagnosis: Muscle weakness (generalized) (M62.81);History of falling (Z91.81);Repeated falls (R29.6);Difficulty in walking, not elsewhere classified (R26.2);Other abnormalities of gait and mobility (R26.89)     Time: 1610-9604 PT Time Calculation (min) (ACUTE ONLY): 8 min  Charges:      PT General Charges $$ ACUTE PT VISIT: 1 Visit                        Tanda Falter, PT Acute Rehabilitation  Office: [phone]

## 2023-05-21 NOTE — Progress Notes (Signed)
 Oral meds not given because patient coughs when water is provided via teaspoon.

## 2023-05-21 NOTE — Progress Notes (Signed)
 PROGRESS NOTE    Bethany Blake  ZHY:[id number] DOB: [birth year] DOA: 05/15/2023 PCP: Margarete Sharps, MD   Brief Narrative: 89 year old with past medical history significant for CKD, hypertension, advanced dementia admitted to the hospital with sialoadenitis.  History is somewhat limited due to patient being nonverbal, resident of [facility] facility.  Family lives out of state, ER provider spoke with her son over the phone.  Patient presented with dehydration, hyponatremia, AKI and sialoadenitis.  Assessment & Plan:   Principal Problem:   Sialoadenitis  1-Sialoadenitis.  -CT head, neck: Asymmetric soft tissue swelling around the right parotid gland,right submandibular gland, and in the subcutaneous soft tissues of the right face. Findings are worrisome for a sialoadenitis. No evidence of a sialolith -Continue with Linezolid  to cover for MRSA, Continue with Unasyn  -Discussed with Dr. Virgia Griffins, we could try IV steroid.  Unable to use NSAID.  Continue with intermittent gland massage and heat. -Appreciate Dr Virgia Griffins follow up. Plan to continue with current management  -IV solumedrol 40 mg IV daily.  -IV antibiotics/.  -swelling stable form yesterday, continue to slowly improved .  2-Hypernatremia: In the setting of dehydration: Continue with IV fluids Resolved.    AKI, CKD stage IIIa: Cr peak to 1.7---1.1 In the setting of dehydration continue with IV fluids Resolved.   Hypothyroidism: Continue with Synthroid   Thrombocytosis likely reactive in the setting of acute infection  Nutrition;  Dysphagia; Speech recommend NPO.  Follow recommendations.  MBS: moderate oral dysphagia with impaired lingual transiting of boluses and decr labial closure with resultant piecemealing and retention. Severe pharyngeal dysphagia c/b severely impaired pharyngeal motility due to edema from current illness.  -Dysphagia not improving. Speech recommend NPO as of yesterday. Might have to discussed Palliative  care consultation.   Dementia;  -per son, she can carry a conversation, recognized family, she has underline dementia.   Anemia; chronic. Iron deficiency.  Low normal B12/  She will need iron when tolerates diet.  Started on B 12 supplement. Folic acid .   Hypokalemia; replaced   Estimated body mass index is 18.01 kg/m as calculated from the following:   Height as of this encounter: 5\' 4"  (1.626 m).   Weight as of this encounter: 47.6 kg.   DVT prophylaxis: heparin   Code Status: DNR Family Communication: Son 2/09 Disposition Plan:  Status is: Inpatient Remains inpatient appropriate because: management of sialoadenitis.     Consultants:  Dr Virgia Griffins. Discussed case  Procedures:  none  Antimicrobials:    Subjective: She is alert, swelling decreasing. Still having significant problems with Dysphagia.   Objective: Vitals:   05/20/23 0507 05/20/23 1246 05/20/23 1944 05/21/23 0445  BP: (!) 149/78 138/69 (!) 144/84 (!) 154/82  Pulse: 61 71 82 84  Resp: 15 20 15 20   Temp: 98.1 F (36.7 C) 97.8 F (36.6 C) 97.9 F (36.6 C) 97.6 F (36.4 C)  TempSrc:    Oral  SpO2: 96% 97% 97% 97%  Weight:      Height:        Intake/Output Summary (Last 24 hours) at 05/21/2023 1258 Last data filed at 05/21/2023 1200 Gross per 24 hour  Intake 600 ml  Output 3100 ml  Net -2500 ml   Filed Weights   05/15/23 1110  Weight: 47.6 kg    Examination:  General exam: NAD ENT: Right side face, mandibular, parotid area improving.  Respiratory system: CTA Cardiovascular system: S 1, S 2 RRR Gastrointestinal system: BS present, soft, nt   Data Reviewed: I  have personally reviewed following labs and imaging studies  CBC: Recent Labs  Lab 05/15/23 1159 05/16/23 0439 05/17/23 0942 05/18/23 0437 05/19/23 1000 05/20/23 0511  WBC 10.4 14.5* 12.5* 7.3 7.3 6.1  NEUTROABS 9.2*  --   --   --   --   --   HGB 11.6* 9.4* 8.0* 8.1* 8.0* 8.2*  HCT 39.2 32.8* 27.9* 28.1* 27.2* 28.4*  MCV  80.5 82.8 82.5 82.6 78.8* 80.2  PLT 422* 347 284 273 285 268   Basic Metabolic Panel: Recent Labs  Lab 05/16/23 0439 05/17/23 0942 05/18/23 0437 05/19/23 1000 05/20/23 0511  NA 150* 147* 145 136 134*  K 4.0 3.1* 3.4* 3.8 3.6  CL 117* 116* 114* 108 105  CO2 19* 22 19* 21* 21*  GLUCOSE 103* 83 122* 172* 170*  BUN 49* 43* 34* 34* 26*  CREATININE 1.53* 1.12* 1.04* 1.10* 0.92  CALCIUM 8.5* 8.3* 8.1* 8.1* 7.9*  MG  --  2.0  --   --   --    GFR: Estimated Creatinine Clearance: 25 mL/min (by C-G formula based on SCr of 0.92 mg/dL). Liver Function Tests: Recent Labs  Lab 05/15/23 1159  AST 17  ALT 8  ALKPHOS 55  BILITOT 1.1  PROT 7.8  ALBUMIN 3.3*   No results for input(s): "LIPASE", "AMYLASE" in the last 168 hours. No results for input(s): "AMMONIA" in the last 168 hours. Coagulation Profile: No results for input(s): "INR", "PROTIME" in the last 168 hours. Cardiac Enzymes: No results for input(s): "CKTOTAL", "CKMB", "CKMBINDEX", "TROPONINI" in the last 168 hours. BNP (last 3 results) No results for input(s): "PROBNP" in the last 8760 hours. HbA1C: No results for input(s): "HGBA1C" in the last 72 hours. CBG: Recent Labs  Lab 05/19/23 1406  GLUCAP 160*   Lipid Profile: No results for input(s): "CHOL", "HDL", "LDLCALC", "TRIG", "CHOLHDL", "LDLDIRECT" in the last 72 hours. Thyroid  Function Tests: No results for input(s): "TSH", "T4TOTAL", "FREET4", "T3FREE", "THYROIDAB" in the last 72 hours. Anemia Panel: No results for input(s): "VITAMINB12", "FOLATE", "FERRITIN", "TIBC", "IRON", "RETICCTPCT" in the last 72 hours.  Sepsis Labs: No results for input(s): "PROCALCITON", "LATICACIDVEN" in the last 168 hours.  No results found for this or any previous visit (from the past 240 hours).       Radiology Studies: No results found.       Scheduled Meds:  cyanocobalamin   1,000 mcg Intramuscular Daily   docusate sodium   100 mg Oral BID   folic acid   1 mg  Intravenous Daily   heparin  injection (subcutaneous)  5,000 Units Subcutaneous Q8H   levothyroxine   112 mcg Oral Q0600   methylPREDNISolone  (SOLU-MEDROL ) injection  40 mg Intravenous Q24H   mouth rinse  15 mL Mouth Rinse 4 times per day   Continuous Infusions:  ampicillin -sulbactam (UNASYN ) IV 3 g (05/21/23 0944)   lactated ringers  50 mL/hr at 05/21/23 1210   linezolid  (ZYVOX ) IV 600 mg (05/21/23 1037)   thiamine  (VITAMIN B1) injection 500 mg (05/20/23 1702)     LOS: 6 days    Time spent: 35 minutes    Airam Runions A Maicie Vanderloop, MD [facility]   If 7PM-7AM, please contact night-coverage [email or web address]  05/21/2023, 12:58 PM

## 2023-05-22 DIAGNOSIS — K1120 Sialoadenitis, unspecified: Secondary | ICD-10-CM | POA: Diagnosis not present

## 2023-05-22 LAB — CBC
HCT: 29.4 % — ABNORMAL LOW (ref 36.0–46.0)
Hemoglobin: 8.8 g/dL — ABNORMAL LOW (ref 12.0–15.0)
MCH: 23.2 pg — ABNORMAL LOW (ref 26.0–34.0)
MCHC: 29.9 g/dL — ABNORMAL LOW (ref 30.0–36.0)
MCV: 77.6 fL — ABNORMAL LOW (ref 80.0–100.0)
Platelets: 241 10*3/uL (ref 150–400)
RBC: 3.79 MIL/uL — ABNORMAL LOW (ref 3.87–5.11)
RDW: 17.7 % — ABNORMAL HIGH (ref 11.5–15.5)
WBC: 5.2 10*3/uL (ref 4.0–10.5)
nRBC: 0 % (ref 0.0–0.2)

## 2023-05-22 LAB — BASIC METABOLIC PANEL
Anion gap: 9 (ref 5–15)
BUN: 14 mg/dL (ref 8–23)
CO2: 24 mmol/L (ref 22–32)
Calcium: 8.1 mg/dL — ABNORMAL LOW (ref 8.9–10.3)
Chloride: 103 mmol/L (ref 98–111)
Creatinine, Ser: 0.74 mg/dL (ref 0.44–1.00)
GFR, Estimated: >60 mL/min (ref >60)
Glucose, Bld: 68 mg/dL — ABNORMAL LOW (ref 70–99)
Potassium: 3.4 mmol/L — ABNORMAL LOW (ref 3.5–5.1)
Sodium: 136 mmol/L (ref 135–145)

## 2023-05-22 MED ORDER — POTASSIUM CHLORIDE 10 MEQ/100ML IV SOLN
10.0000 meq | INTRAVENOUS | Status: AC
Start: 2023-05-22 — End: 2023-05-22
  Administered 2023-05-22 (×2): 10 meq via INTRAVENOUS
  Filled 2023-05-22 (×2): qty 100

## 2023-05-22 MED ORDER — LACTATED RINGERS IV SOLN: INTRAVENOUS | Status: DC

## 2023-05-22 MED ORDER — DEXTROSE 50 % IV SOLN
1.0000 | Freq: Once | INTRAVENOUS | Status: AC
Start: 2023-05-22 — End: 2023-05-22
  Administered 2023-05-22: 50 mL via INTRAVENOUS
  Filled 2023-05-22: qty 50

## 2023-05-22 NOTE — Progress Notes (Signed)
Patient is alert, difficulty talking at times, as she is also a heavy mouth breather with dry mucous membranes and tongue. Patient has some clear communication. Patient clinches mouth close during oral care, stating, "no just leave". Allowed oral care to be complete with negotiation to leave right after. MD notified of patients apprehension for better words, patients request.

## 2023-05-22 NOTE — Progress Notes (Signed)
SLP Cancellation Note  Patient Details Name: Bethany Blake MRN: [id number] DOB: 1922-02-22   Cancelled treatment:       Reason Eval/Treat Not Completed: Other (comment);Fatigue/lethargy limiting ability to participate (patient did not wake up this am to thermal stimulation; cold to neck and chest region; subjectively neck swelling looks much improved; requested RN message this SLP if pt wakes to allow reassessment)  Rolena Infante, MS [facility] SLP Acute Rehab Services Office [phone]   Chales Abrahams 05/22/2023, 9:23 AM

## 2023-05-22 NOTE — Progress Notes (Signed)
OT Cancellation Note  Patient Details Name: Bethany Blake MRN: [id number] DOB: 05-Apr-1922   Cancelled Treatment:    Reason Eval/Treat Not Completed: Fatigue/lethargy limiting ability to participate Patient is sleeping with even breathing in bed. OT to continue to follow and check back as schedule will allow.  Rosalio Loud, MS Acute Rehabilitation Department Office# [phone]  05/22/2023, 3:27 PM

## 2023-05-22 NOTE — Progress Notes (Signed)
Speech Language Pathology Treatment: Dysphagia  Patient Details Name: Bethany Blake MRN: [id number] DOB: 1921/04/30 Today's Date: 05/22/2023 Time: 5284-1324 SLP Time Calculation (min) (ACUTE ONLY): 42 min  Assessment / Plan / Recommendation Clinical Impression  Pt seen for dysphagia management - to determine readiness for po intake. [name] is refusing all intake today even with Ron on FT attempting to encourage her to accept intake. SLP was able to get a trace amount of italian ice into mouth - which pt spat out. She repeated "Leave me alone" and "Get out of here". Suspect pt with both cognitive based dysphagia with exacerbation due to infection. Given she is refusing intake, she will not meet nutritional nor hydration needs regardless of ability to swallow.  Neck edema appears significantly decreased which should improve her swallow function - however she is now refusing all intake despite family encouragement.    Son, [name], says pt has a living will and does not want a feeding tube. She is resistant to oral care, which will contribute to reinfection risk. Pt also spoke to son, [name], on the phone who also encouraged intake without pt participation. It whas been reported that pt's intake was suboptimal PTA - may have contributed to her infection. Prognosis for nutrition and hydration is poor in this SLPs opinion given most recent history and current status. Relayed information to Dr Sunnie Nielsen and spoke to RN via secure chat.  Allowed plenty of time for pt to communicate with both sons to encourage intake.     HPI HPI: Per MD note "Bethany Blake is a 89 y.o. female with medical history significant for CKD, hypertension, advanced dementia being admitted to the hospital with sialoadenitis and fall .  She is somewhat limited as the patient is nonverbal, she is a resident of [facility] nursing facility.  Her son and daughter both live out of state, ER provider spoke with her son over the phone.  Workup as detailed  below shows evidence of dehydration, hypernatremia, AKI, and sialoadenitis.  Patient has been started on IV antibiotics, given IV fluids and admitted to the hospitalist service."  Patient has prior medical history of dysphagia diagnosed October 2023 with recommendations of dysphagia 2 and thin liquid at that time with strict precautions.  Past medical history also includes COVID.  MD was in touch with Dr. Jearld Fenton ENT who recommended IV antibiotics, intermittent gland massage, heat and NSAIDs.  SLP has been following up to determine readiness for po intake.      SLP Plan  Continue with current plan of care      Recommendations for follow up therapy are one component of a multi-disciplinary discharge planning process, led by the attending physician.  Recommendations may be updated based on patient status, additional functional criteria and insurance authorization.    Recommendations  Diet recommendations:  (try liquids if pt will accept) Medication Administration: Via alternative means Compensations: Slow rate;Small sips/bites Postural Changes and/or Swallow Maneuvers: Seated upright 90 degrees (assure pt swallows)                  Oral care QID     Dysphagia, oropharyngeal phase (R13.12)     Continue with current plan of care     Chales Abrahams  05/22/2023, 11:37 AM

## 2023-05-22 NOTE — Progress Notes (Signed)
PROGRESS NOTE    Bethany Blake  YQI:[id number] DOB: [birth year] DOA: 05/15/2023 PCP: [name], MD   Brief Narrative: 89 year old with past medical history significant for CKD, hypertension, advanced dementia admitted to the hospital with sialoadenitis.  History is somewhat limited due to patient being nonverbal, resident of [facility] facility.  Family lives out of state, ER provider spoke with her son over the phone.  Patient presented with dehydration, hyponatremia, AKI and sialoadenitis.  Patient has been treated with IV linezolid and Unasyn for sialoadenitis.  She has also received IV Solu-Medrol.  Facial and neck swelling on the right side has significantly improved, but patient failed to tolerate oral intake.  Palliative care has been consulted.  Son will discuss with the rest of the family, before making transition to comfort care measure  Assessment & Plan:   Principal Problem:   Sialoadenitis  1-Sialoadenitis.  -CT head, neck: Asymmetric soft tissue swelling around the right parotid gland,right submandibular gland, and in the subcutaneous soft tissues of the right face. Findings are worrisome for a sialoadenitis. No evidence of a sialolith -Continue with Linezolid to cover for MRSA, Continue with Unasyn -Discussed with Dr. Jearld Fenton, we could try IV steroid.  Unable to use NSAID.  Continue with intermittent gland massage and heat. -Appreciate Dr Jearld Fenton follow up. Plan to continue with current management  -IV solumedrol 40 mg IV daily.  -IV antibiotics/.  -Swelling significantly improved, but patient with no oral intake yet. She wouldn't want Feeding tube. Palliative care consulted. Son, needs to discussed with rest of family before he makes decisions.   2-Hypernatremia: In the setting of dehydration: Continue with IV fluids Resolved.    AKI, CKD stage IIIa: Cr peak to 1.7---1.1 In the setting of dehydration continue with IV fluids Resolved.   Hypothyroidism: Continue with  Synthroid.  Thrombocytosis:  Likely reactive in the setting of acute infection  Nutrition: FTT, Dysphagia:  -Speech recommend NPO.  -MBS: moderate oral dysphagia with impaired lingual transiting of boluses and decr labial closure with resultant piecemealing and retention. Severe pharyngeal dysphagia c/b severely impaired pharyngeal motility due to edema from current illness.  -Dysphagia not improving. Speech recommend NPO.  -patient refuse oral care, or intake. Discussed with SOn, he will discussed with rest of family. Palliative care consulted.   Dementia;  -per son, she can carry a conversation, recognized family, she has underline dementia.   Anemia; chronic. Iron deficiency.  Low normal B12/  She will need iron when tolerates diet.  Started on B 12 supplement. Folic acid.   Hypokalemia; Replete IV Hypoglycemia; Will give amp D 50, IV fluids.   Estimated body mass index is 18.01 kg/m as calculated from the following:   Height as of this encounter: 5\' 4"  (1.626 m).   Weight as of this encounter: 47.6 kg.   DVT prophylaxis: heparin  Code Status: DNR Family Communication: Son 2/09 Disposition Plan:  Status is: Inpatient Remains inpatient appropriate because: management of sialoadenitis. Palliative care consult.    Consultants:  Dr Jearld Fenton. Discussed case  Procedures:  none  Antimicrobials:    Subjective: Alert, refuse oral care or oral intake.   Objective: Vitals:   05/21/23 0445 05/21/23 1626 05/21/23 1920 05/22/23 0500  BP: (!) 154/82 (!) 155/73 (!) 140/79 (!) 155/74  Pulse: 84 71 85 72  Resp: 20 20 14 15   Temp: 97.6 F (36.4 C) 98 F (36.7 C) 97.7 F (36.5 C) 97.6 F (36.4 C)  TempSrc: Oral  SpO2: 97% 97% 97% 96%  Weight:      Height:        Intake/Output Summary (Last 24 hours) at 05/22/2023 1237 Last data filed at 05/22/2023 0711 Gross per 24 hour  Intake 1401.38 ml  Output 825 ml  Net 576.38 ml   Filed Weights   05/15/23 1110  Weight:  47.6 kg    Examination:  General exam: NAD ENT: Right side face, mandibular, parotid area swelling decreasing.  Respiratory system: CTA Cardiovascular system: S 1, S 2 RRRR Gastrointestinal system: BS present, soft, nt   Data Reviewed: I have personally reviewed following labs and imaging studies  CBC: Recent Labs  Lab 05/17/23 0942 05/18/23 0437 05/19/23 1000 05/20/23 0511 05/22/23 0859  WBC 12.5* 7.3 7.3 6.1 5.2  HGB 8.0* 8.1* 8.0* 8.2* 8.8*  HCT 27.9* 28.1* 27.2* 28.4* 29.4*  MCV 82.5 82.6 78.8* 80.2 77.6*  PLT 284 273 285 268 241   Basic Metabolic Panel: Recent Labs  Lab 05/17/23 0942 05/18/23 0437 05/19/23 1000 05/20/23 0511 05/22/23 0859  NA 147* 145 136 134* 136  K 3.1* 3.4* 3.8 3.6 3.4*  CL 116* 114* 108 105 103  CO2 22 19* 21* 21* 24  GLUCOSE 83 122* 172* 170* 68*  BUN 43* 34* 34* 26* 14  CREATININE 1.12* 1.04* 1.10* 0.92 0.74  CALCIUM 8.3* 8.1* 8.1* 7.9* 8.1*  MG 2.0  --   --   --   --    GFR: Estimated Creatinine Clearance: 28.8 mL/min (by C-G formula based on SCr of 0.74 mg/dL). Liver Function Tests: No results for input(s): "AST", "ALT", "ALKPHOS", "BILITOT", "PROT", "ALBUMIN" in the last 168 hours.  No results for input(s): "LIPASE", "AMYLASE" in the last 168 hours. No results for input(s): "AMMONIA" in the last 168 hours. Coagulation Profile: No results for input(s): "INR", "PROTIME" in the last 168 hours. Cardiac Enzymes: No results for input(s): "CKTOTAL", "CKMB", "CKMBINDEX", "TROPONINI" in the last 168 hours. BNP (last 3 results) No results for input(s): "PROBNP" in the last 8760 hours. HbA1C: No results for input(s): "HGBA1C" in the last 72 hours. CBG: Recent Labs  Lab 05/19/23 1406  GLUCAP 160*   Lipid Profile: No results for input(s): "CHOL", "HDL", "LDLCALC", "TRIG", "CHOLHDL", "LDLDIRECT" in the last 72 hours. Thyroid Function Tests: No results for input(s): "TSH", "T4TOTAL", "FREET4", "T3FREE", "THYROIDAB" in the last 72  hours. Anemia Panel: No results for input(s): "VITAMINB12", "FOLATE", "FERRITIN", "TIBC", "IRON", "RETICCTPCT" in the last 72 hours.  Sepsis Labs: No results for input(s): "PROCALCITON", "LATICACIDVEN" in the last 168 hours.  No results found for this or any previous visit (from the past 240 hours).       Radiology Studies: No results found.       Scheduled Meds:  docusate sodium  100 mg Oral BID   folic acid  1 mg Intravenous Daily   heparin injection (subcutaneous)  5,000 Units Subcutaneous Q8H   levothyroxine  112 mcg Oral Q0600   methylPREDNISolone (SOLU-MEDROL) injection  40 mg Intravenous Q24H   mouth rinse  15 mL Mouth Rinse 4 times per day   Continuous Infusions:  ampicillin-sulbactam (UNASYN) IV 3 g (05/22/23 0948)   lactated ringers     linezolid (ZYVOX) IV 600 mg (05/22/23 0947)   thiamine (VITAMIN B1) injection Stopped (05/21/23 1912)     LOS: 7 days    Time spent: 35 minutes    Hien Perreira A Danaisha Celli, MD [facility]   If 7PM-7AM, please contact night-coverage [email or web address]  05/22/2023,  12:37 PM

## 2023-05-22 NOTE — Plan of Care (Signed)

## 2023-05-23 DIAGNOSIS — Z7189 Other specified counseling: Secondary | ICD-10-CM | POA: Diagnosis not present

## 2023-05-23 DIAGNOSIS — R4589 Other symptoms and signs involving emotional state: Secondary | ICD-10-CM

## 2023-05-23 DIAGNOSIS — Z66 Do not resuscitate: Secondary | ICD-10-CM

## 2023-05-23 DIAGNOSIS — Z515 Encounter for palliative care: Secondary | ICD-10-CM | POA: Diagnosis not present

## 2023-05-23 DIAGNOSIS — Z79899 Other long term (current) drug therapy: Secondary | ICD-10-CM

## 2023-05-23 DIAGNOSIS — Z711 Person with feared health complaint in whom no diagnosis is made: Secondary | ICD-10-CM

## 2023-05-23 DIAGNOSIS — K1120 Sialoadenitis, unspecified: Secondary | ICD-10-CM | POA: Diagnosis not present

## 2023-05-23 DIAGNOSIS — R638 Other symptoms and signs concerning food and fluid intake: Secondary | ICD-10-CM

## 2023-05-23 LAB — BASIC METABOLIC PANEL
Anion gap: 8 (ref 5–15)
BUN: 15 mg/dL (ref 8–23)
CO2: 21 mmol/L — ABNORMAL LOW (ref 22–32)
Calcium: 7.7 mg/dL — ABNORMAL LOW (ref 8.9–10.3)
Chloride: 99 mmol/L (ref 98–111)
Creatinine, Ser: 0.75 mg/dL (ref 0.44–1.00)
GFR, Estimated: >60 mL/min (ref >60)
Glucose, Bld: 85 mg/dL (ref 70–99)
Potassium: 4 mmol/L (ref 3.5–5.1)
Sodium: 128 mmol/L — ABNORMAL LOW (ref 135–145)

## 2023-05-23 LAB — CBC
HCT: 30.1 % — ABNORMAL LOW (ref 36.0–46.0)
Hemoglobin: 8.9 g/dL — ABNORMAL LOW (ref 12.0–15.0)
MCH: 23.4 pg — ABNORMAL LOW (ref 26.0–34.0)
MCHC: 29.6 g/dL — ABNORMAL LOW (ref 30.0–36.0)
MCV: 79.2 fL — ABNORMAL LOW (ref 80.0–100.0)
Platelets: 216 10*3/uL (ref 150–400)
RBC: 3.8 MIL/uL — ABNORMAL LOW (ref 3.87–5.11)
RDW: 18.1 % — ABNORMAL HIGH (ref 11.5–15.5)
WBC: 5.9 10*3/uL (ref 4.0–10.5)
nRBC: 0 % (ref 0.0–0.2)

## 2023-05-23 MED ORDER — MORPHINE SULFATE (PF) 2 MG/ML IV SOLN: 1.0000 mg | INTRAVENOUS | Status: DC | PRN

## 2023-05-23 MED ORDER — BISACODYL 10 MG RE SUPP: 10.0000 mg | Freq: Every day | RECTAL | Status: DC | PRN

## 2023-05-23 MED ORDER — BIOTENE DRY MOUTH MT LIQD
15.0000 mL | OROMUCOSAL | Status: DC | PRN
  Filled 2023-05-23: qty 237

## 2023-05-23 MED ORDER — POLYVINYL ALCOHOL 1.4 % OP SOLN: 1.0000 [drp] | Freq: Four times a day (QID) | OPHTHALMIC | Status: DC | PRN

## 2023-05-23 MED ORDER — GLYCOPYRROLATE 0.2 MG/ML IJ SOLN: 0.2000 mg | INTRAMUSCULAR | Status: DC | PRN

## 2023-05-23 MED ORDER — GLYCOPYRROLATE 1 MG PO TABS: 1.0000 mg | ORAL_TABLET | ORAL | Status: DC | PRN

## 2023-05-23 MED ORDER — HALOPERIDOL LACTATE 2 MG/ML PO CONC: 0.5000 mg | ORAL | Status: DC | PRN

## 2023-05-23 MED ORDER — HALOPERIDOL 0.5 MG PO TABS: 0.5000 mg | ORAL_TABLET | ORAL | Status: DC | PRN

## 2023-05-23 MED ORDER — HALOPERIDOL LACTATE 5 MG/ML IJ SOLN: 0.5000 mg | INTRAMUSCULAR | Status: DC | PRN

## 2023-05-23 MED ORDER — MORPHINE SULFATE (CONCENTRATE) 10 MG /0.5 ML PO SOLN
2.0000 mg | ORAL | Status: DC | PRN
  Administered 2023-05-23: 2 mg via ORAL
  Filled 2023-05-23: qty 0.5

## 2023-05-23 NOTE — Progress Notes (Signed)
PROGRESS NOTE  Bethany Blake WUJ:[id number] DOB: 04/28/1921 DOA: 05/15/2023 PCP: [name], MD   LOS: 8 days   Brief Narrative / Interim history: 89 year old female with CKD, HTN, advanced dementia admitted to the hospital with sialoadenitis.  She is a resident of nursing facility, and patient is minimally verbal at baseline.  Family lives out of state.  ENT consulted, she was placed on treatment with steroids, antibiotics, however she continues not to do well overall with poor p.o. intake.  GOC ongoing  Subjective / 24h Interval events: She can open her eyes, mumbles a few words and I can understand that she is "not feeling good", but cannot elaborate  Assesement and Plan: Principal problem Sialoadenitis -CT head and neck on admission showed asymmetric soft tissue swelling around the right parotid gland, right submandibular gland at the subcutaneous soft tissues of the right face, concerning for sialoadenitis.  ENT consulted, continue supportive care with biotics, steroids.  Unfortunately she continues to have poor p.o. intake  Active problems Goals of care-treating #1, however given poor p.o. intake goals of care are ongoing, palliative care consulted, appreciate input.  She would be very appropriate for full comfort/hospice.  Would not want a feeding tube, she is DNR  Acute kidney injury on chronic kidney disease stage IIIa-creatinine peaked at 1.7, baseline around 1.1.  Improved with fluids now  Hypernatremia-due to dehydration, sodium improved  Anemia of chronic disease-hemoglobin stable, no bleeding  Leukocytosis-improved  Hypokalemia-replace potassium and continue to monitor  Hypothyroidism-continue Synthroid  Thrombocytosis-reactive  Severe protein calorie malnutrition-GOC ongoing  Dementia-noted  Scheduled Meds:  docusate sodium  100 mg Oral BID   folic acid  1 mg Intravenous Daily   heparin injection (subcutaneous)  5,000 Units Subcutaneous Q8H   levothyroxine  112  mcg Oral Q0600   methylPREDNISolone (SOLU-MEDROL) injection  40 mg Intravenous Q24H   mouth rinse  15 mL Mouth Rinse 4 times per day   Continuous Infusions:  ampicillin-sulbactam (UNASYN) IV 3 g (05/22/23 2131)   lactated ringers 50 mL/hr at 05/22/23 1749   linezolid (ZYVOX) IV 600 mg (05/22/23 2136)   thiamine (VITAMIN B1) injection 110 mL/hr at 05/22/23 1749   PRN Meds:.acetaminophen **OR** acetaminophen, albuterol, hydrALAZINE, ondansetron **OR** ondansetron (ZOFRAN) IV, mouth rinse, mouth rinse, polyethylene glycol  Current Outpatient Medications  Medication Instructions   acetaminophen (TYLENOL) 500 mg, Oral, Every 5 hours PRN   cholecalciferol (VITAMIN D3) 1,000 Units, Oral, Daily   levothyroxine (SYNTHROID) 112 mcg, Oral, Daily   loperamide (IMODIUM A-D) 2 mg, Oral, Every 8 hours PRN   polyethylene glycol (MIRALAX / GLYCOLAX) 17 g, Oral, Daily PRN    Diet Orders (From admission, onward)     Start     Ordered   05/18/23 1447  Diet NPO time specified  Diet effective now       Comments: Sip water with spoon.   05/18/23 1446            DVT prophylaxis: heparin injection 5,000 Units Start: 05/15/23 2200 SCDs Start: 05/15/23 1426   Lab Results  Component Value Date   PLT 216 05/23/2023      Code Status: Limited: Do not attempt resuscitation (DNR) -DNR-LIMITED -Do Not Intubate/DNI   Family Communication: no family at bedside   Status is: Inpatient Remains inpatient appropriate because: severity of illness  Level of care: Med-Surg  Consultants:  Palliative ENT  Objective: Vitals:   05/22/23 0500 05/22/23 1510 05/22/23 1933 05/23/23 0432  BP: (!) 155/74 (!) 146/65 138/65 Marland Kitchen)  157/66  Pulse: 72 (!) 58 68 66  Resp: 15 18 20 20   Temp: 97.6 F (36.4 C) 97.6 F (36.4 C) 98.2 F (36.8 C) 97.7 F (36.5 C)  TempSrc:      SpO2: 96% 98% 95% 97%  Weight:      Height:        Intake/Output Summary (Last 24 hours) at 05/23/2023 1134 Last data filed at 05/23/2023  0321 Gross per 24 hour  Intake 1664.38 ml  Output 250 ml  Net 1414.38 ml   Wt Readings from Last 3 Encounters:  05/15/23 47.6 kg  01/19/22 47.6 kg  06/20/21 58.8 kg    Examination:  Constitutional: NAD Eyes: no scleral icterus ENMT: Mucous membranes are dry.  Neck: normal, supple Respiratory: clear to auscultation bilaterally, no wheezing, no crackles.  Cardiovascular: Regular rate and rhythm, no murmurs / rubs / gallops. No LE edema.  Abdomen: non distended, no tenderness. Bowel sounds positive.  Musculoskeletal: no clubbing / cyanosis.    Data Reviewed: I have independently reviewed following labs and imaging studies   CBC Recent Labs  Lab 05/18/23 0437 05/19/23 1000 05/20/23 0511 05/22/23 0859 05/23/23 0448  WBC 7.3 7.3 6.1 5.2 5.9  HGB 8.1* 8.0* 8.2* 8.8* 8.9*  HCT 28.1* 27.2* 28.4* 29.4* 30.1*  PLT 273 285 268 241 216  MCV 82.6 78.8* 80.2 77.6* 79.2*  MCH 23.8* 23.2* 23.2* 23.2* 23.4*  MCHC 28.8* 29.4* 28.9* 29.9* 29.6*  RDW 17.9* 17.8* 17.7* 17.7* 18.1*    Recent Labs  Lab 05/17/23 0942 05/18/23 0437 05/19/23 1000 05/20/23 0511 05/22/23 0859 05/23/23 0448  NA 147* 145 136 134* 136 128*  K 3.1* 3.4* 3.8 3.6 3.4* 4.0  CL 116* 114* 108 105 103 99  CO2 22 19* 21* 21* 24 21*  GLUCOSE 83 122* 172* 170* 68* 85  BUN 43* 34* 34* 26* 14 15  CREATININE 1.12* 1.04* 1.10* 0.92 0.74 0.75  CALCIUM 8.3* 8.1* 8.1* 7.9* 8.1* 7.7*  MG 2.0  --   --   --   --   --     ------------------------------------------------------------------------------------------------------------------ No results for input(s): "CHOL", "HDL", "LDLCALC", "TRIG", "CHOLHDL", "LDLDIRECT" in the last 72 hours.  No results found for: "HGBA1C" ------------------------------------------------------------------------------------------------------------------ No results for input(s): "TSH", "T4TOTAL", "T3FREE", "THYROIDAB" in the last 72 hours.  Invalid input(s): "FREET3"  Cardiac Enzymes No  results for input(s): "CKMB", "TROPONINI", "MYOGLOBIN" in the last 168 hours.  Invalid input(s): "CK" ------------------------------------------------------------------------------------------------------------------ No results found for: "BNP"  CBG: Recent Labs  Lab 05/19/23 1406  GLUCAP 160*    No results found for this or any previous visit (from the past 240 hours).   Radiology Studies: No results found.   Pamella Pert, MD, PhD [facility]  Between 7 am - 7 pm I am available, please contact me via Amion (for emergencies) or Securechat (non urgent messages)  Between 7 pm - 7 am I am not available, please contact night coverage MD/APP via Amion

## 2023-05-23 NOTE — Consult Note (Signed)
Consultation Note Date: 05/23/2023   Patient Name: Bethany Blake  DOB: June 11, 1921  MRN: [id number]  Age / Sex: 89 y.o., female   PCP: [name], MD Referring Physician: Leatha Gilding, MD  Reason for Consultation: Establishing goals of care     Chief Complaint/History of Present Illness:   Patient is a 89 year old female with a past medical history CKD, hypertension, and dementia receiving long-term care at a nursing facility who was admitted on 2/25 for management of sialoadenitis management.  CT head and neck on admission showed sialoadenitis and so ENT was consulted.  ENT recommended supportive care.  Noted that patient had poor oral intake and was minimally verbal at baseline prior to admission.  Family members live out of state.  Palliative medicine team consulted for complex medical decision making.  Extensive review of EMR prior to presenting to bedside.  Upon EMR review, patient noted to have DNR form completed.  Patient also has MOST form completed which was reviewed.  Discussed care with hospitalist for updates as well.  Presented to bedside to meet with patient.  Patient laying asleep in bed.  Family at bedside introduced himself as patient's niece and her husband.  Introduced myself as a member of the palliative medicine team.  Tried to inquire patient has HCPOA documentation which niece believes she did though she did not know which of patient's 3 children was HCPOA.  She described that patient's daughter, Jacki Cones, is currently on occurs unable to be reached at this moment.  Noted would reach out to patient's son [name] and [name].  Patient very sleepy during entire interaction.  Patient only awakened to mumble to be left alone so she could sleep. Attempted to call patient's daughter, Jacki Cones, without answer.  Then reached out to patient's son, Ron.  Introduced myself to Ron and my role of the palliative medicine team.  Spent time learning about patient's medical status prior to admission.   Inquired with Ron if patient had HCPOA documentation.  Ron reviewed the documentation he has at the house and noted that his father who has passed away was original [place] and his sister, Jacki Cones, is patient's primary alternate.  Ron also agreed that Lawson Fiscal is not reachable right now as she is on a cruise.  Discussed then since patient's HCPOA is not reasonably reachable, would discuss care with Ron and his brother [name].  Ron agreed with this.  Ron noted that he and his sister and his brother have discussed patient's care and realize that she is at the end of life.  They would want her primary focus to be comfort at this time.  Noted would discussed this with [name] and focus on comfort focused care at this time.  Discussed would discontinue interventions that are no longer comfortable such as IV fluids, imaging, antibiotics, and lab draws.  Would instead provide medications for comfort like pain, shortness of breath, and agitation.  Ron agreeing with this plan.  Also introduced the fact that we will transition to comfort at this time and monitor patient's medical status.  Should patient deteriorate needing medications for symptom management, could potentially consider inpatient hospice referral.  If patient not needing frequent medications or meet Medicare guidelines for inpatient hospice, may need to return to facility with hospice.  Ron acknowledges.  All questions answered at that time.  Noted would reach out to [name] and Ron asked to call him back to update about conversation.  Noted would do this.  Able to call patient's son,  [name].  Introduced myself as a member of the palliative medicine team my role in patient's medical journey.  Updated [name] regarding discussion had with Ron.  Again discussed transition to comfort focused care at this time.  [name] agreed with this and noted that he and his brother and his sister have discussed this previously.  Noted would change orders accordingly.  Again introduced the idea  about inpatient hospice evaluation versus returning to facility with hospice.  [name] acknowledged this.  All questions answered at that time.  [name] for allowing me to speak with him today.  Called Ron back and noted.  Agreed with transition to comfort focused care at this time.  Will change orders accordingly.  Ron voiced appreciation for updates.  Updated IDT regarding transition to comfort focused care at this time.  Primary Diagnoses  Present on Admission:  Sialoadenitis   Past Medical History:  Diagnosis Date   Anxiety    Arthritis    Chronic kidney disease    Hypertension    Hypokalemia    Hyponatremia    Hypothyroidism    Social History   Socioeconomic History   Marital status: Widowed    Spouse name: Not on file   Number of children: 3   Years of education: Not on file   Highest education level: Not on file  Occupational History   Occupation: Retired  Tobacco Use   Smoking status: Former   Smokeless tobacco: Never  Advertising account planner   Vaping status: Never Used  Substance and Sexual Activity   Alcohol use: No   Drug use: No   Sexual activity: Not on file  Other Topics Concern   Not on file  Social History Narrative   Not on file   Social Drivers of Health   Financial Resource Strain: Not on file  Food Insecurity: Not on file  Transportation Needs: Not on file  Physical Activity: Not on file  Stress: Not on file  Social Connections: Not on file   Family History  Problem Relation Age of Onset   Hypertension Other    Colon cancer Neg Hx    Colon polyps Neg Hx    Rectal cancer Neg Hx    Stomach cancer Neg Hx    Scheduled Meds:  docusate sodium  100 mg Oral BID   folic acid  1 mg Intravenous Daily   heparin injection (subcutaneous)  5,000 Units Subcutaneous Q8H   levothyroxine  112 mcg Oral Q0600   methylPREDNISolone (SOLU-MEDROL) injection  40 mg Intravenous Q24H   mouth rinse  15 mL Mouth Rinse 4 times per day   Continuous Infusions:   ampicillin-sulbactam (UNASYN) IV 3 g (05/22/23 2131)   lactated ringers 50 mL/hr at 05/22/23 1749   linezolid (ZYVOX) IV 600 mg (05/22/23 2136)   thiamine (VITAMIN B1) injection 110 mL/hr at 05/22/23 1749   PRN Meds:.acetaminophen **OR** acetaminophen, albuterol, hydrALAZINE, ondansetron **OR** ondansetron (ZOFRAN) IV, mouth rinse, mouth rinse, polyethylene glycol Allergies  Allergen Reactions   Ace Inhibitors Other (See Comments)    Severe hyponatremia. Allergy not listed on MAR    Aldactone [Spironolactone] Other (See Comments)    Severe hyponatremia. Allergy not listed on MAR    Angiotensin Receptor Blockers Other (See Comments)    Severe hyponatremia. Allergy not listed on MAR    Sulfa Antibiotics Rash    Allergy not listed on MAR    CBC:    Component Value Date/Time   WBC 5.9 05/23/2023 0448   HGB 8.9 (  L) 05/23/2023 0448   HCT 30.1 (L) 05/23/2023 0448   PLT 216 05/23/2023 0448   MCV 79.2 (L) 05/23/2023 0448   NEUTROABS 9.2 (H) 05/15/2023 1159   LYMPHSABS 0.8 05/15/2023 1159   MONOABS 0.4 05/15/2023 1159   EOSABS 0.0 05/15/2023 1159   BASOSABS 0.0 05/15/2023 1159   Comprehensive Metabolic Panel:    Component Value Date/Time   NA 128 (L) 05/23/2023 0448   NA 132 (A) 09/03/2014 1041   K 4.0 05/23/2023 0448   CL 99 05/23/2023 0448   CO2 21 (L) 05/23/2023 0448   BUN 15 05/23/2023 0448   BUN 23 (A) 09/03/2014 1041   CREATININE 0.75 05/23/2023 0448   GLUCOSE 85 05/23/2023 0448   CALCIUM 7.7 (L) 05/23/2023 0448   AST 17 05/15/2023 1159   ALT 8 05/15/2023 1159   ALKPHOS 55 05/15/2023 1159   BILITOT 1.1 05/15/2023 1159   PROT 7.8 05/15/2023 1159   ALBUMIN 3.3 (L) 05/15/2023 1159    Physical Exam: Vital Signs: BP (!) 157/66 (BP Location: Left Arm)   Pulse 66   Temp 97.7 F (36.5 C)   Resp 20   Ht 5\' 4"  (1.626 m)   Wt 47.6 kg   SpO2 97%   BMI 18.01 kg/m  SpO2: SpO2: 97 % O2 Device: O2 Device: Room Air O2 Flow Rate:   Intake/output summary:  Intake/Output  Summary (Last 24 hours) at 05/23/2023 0836 Last data filed at 05/23/2023 0321 Gross per 24 hour  Intake 1664.38 ml  Output 250 ml  Net 1414.38 ml   LBM: Last BM Date : 05/17/23 Baseline Weight: Weight: 47.6 kg Most recent weight: Weight: 47.6 kg  General: Sleeping, chronically ill-appearing, cachectic, frail HENT: dry mucous membranes Cardiovascular: RRR Respiratory: no increased work of breathing noted, not in respiratory distress Neuro: Sleeping, mumbling at times when awake          Palliative Performance Scale: 10%              Additional Data Reviewed: Recent Labs    05/22/23 0859 05/23/23 0448  WBC 5.2 5.9  HGB 8.8* 8.9*  PLT 241 216  NA 136 128*  BUN 14 15  CREATININE 0.74 0.75    Imaging: DG Swallowing Func-Speech Pathology Table formatting from the original result was not included. Modified Barium Swallow Study  Patient Details  Name: ZORIAH PULICE MRN: [id number] Date of Birth: 12-18-1921  Today's Date: 05/16/2023  HPI/PMH: HPI: Per MD note "AMIAYA MCNEELEY is a 89 y.o. female with medical history  significant for CKD, hypertension, advanced dementia being admitted to the  hospital with sialoadenitis and fall .  She is somewhat limited as the  patient is nonverbal, she is a resident of [facility] nursing facility.   Her son and daughter both live out of state, ER provider spoke with her  son over the phone.  Workup as detailed below shows evidence of  dehydration, hypernatremia, AKI, and sialoadenitis.  Patient has been  started on IV antibiotics, given IV fluids and admitted to the hospitalist  service."  Patient has prior medical history of dysphagia diagnosed  October 2023 with recommendations of dysphagia 2 and thin liquid at that  time with strict precautions.  Past medical history also includes COVID.   MD was in touch with Dr. Jearld Fenton ENT who recommended IV antibiotics,  intermittent gland massage, heat and NSAIDs.  Clinical Impression: Clinical  Impression: Limited assessment secondary to pt's edema and level  of dysphagia -  and view limited by pt's right shoulder being elevated to  near ear impairing view.    Pt demonstrates moderate oral dysphagia with  impaired lingual transiting of boluses and decr labial closure with  resultant piecemealing and retention.  Severe pharyngeal dysphagia c/b  severely impaired pharyngeal motility due to edema from current illness.   Pharyngeal contraction, tongue base retraction and laryngeal closure  impaired allowing severe retention (mixing with severe secretions) and  laryngeal penetration/trace aspiration.   Pt has some awareness/sensation  to pharyngeal impairment as she conducts dry swallows without cues. She  also largely senses severe frank laryngeal penetration as she reflexively  coughs and attempts to expectorate.  Comparing imaging from prior MBS from  2023 reveals severe edema currently. Pt did not conduct head turn as SLP  requested to determine if may decrease retention. At this time, recommend  NPO with aggressive oral care and frequent oral moisture - tsps of thin  water after mouth care and encouraging pt to cough and expectorate as  able.  Will follow up for readiness for intake - hopefully as edema  dissipates.  Of note, SLP provided oral suctioning following exam and  removed viscous secretions coated with barium - frequent oral care is  vital for this pt - RN informed.  Factors that may increase risk of adverse event in presence of aspiration  Rubye Oaks & Clearance Coots 2021): Factors that may increase risk of adverse event  in presence of aspiration Rubye Oaks & Clearance Coots 2021): Reduced cognitive  function; Poor general health and/or compromised immunity; Reduced saliva;  Inadequate oral hygiene; Frail or deconditioned; Limited mobility  Recommendations/Plan: Swallowing Evaluation Recommendations Swallowing Evaluation Recommendations Recommendations: -- (tsps of thin water after  mouth care) Liquid Administration via: Spoon Medication Administration: Via alternative means Supervision: Full supervision/cueing for swallowing strategies Swallowing strategies  : Slow rate; Small bites/sips Postural changes: Position pt fully upright for meals; Stay upright 30-60  min after meals Oral care recommendations: Oral care QID (4x/day) Caregiver Recommendations: Have oral suction available  Treatment Plan Treatment Plan Treatment recommendations: Therapy as outlined in treatment plan below Follow-up recommendations: Skilled nursing-short term rehab (<3 hours/day) Functional status assessment: Patient has had a recent decline in their  functional status and demonstrates the ability to make significant  improvements in function in a reasonable and predictable amount of time. Treatment frequency: Min 1x/week Treatment duration: 1 week Interventions: Aspiration precaution training; Other (comment); Trials of  upgraded texture/liquids; Patient/family education; Compensatory  techniques  Recommendations Recommendations for follow up therapy are one component of a  multi-disciplinary discharge planning process, led by the attending  physician.  Recommendations may be updated based on patient status,  additional functional criteria and insurance authorization.  Assessment: Orofacial Exam: Orofacial Exam Oral Cavity: Oral Hygiene: Xerostomia; Other (comment); Dried secretions Oral Cavity - Dentition: Other (Comment); Dentures, not available Orofacial Anatomy: Other (comment) Oral Motor/Sensory Function: Other (comment) (pt with difficulty sealing  her lips - lingual protrusion)  Anatomy:  No data recorded  Boluses Administered: Boluses Administered Boluses Administered: Thin liquids (Level 0); Mildly thick liquids (Level  2, nectar thick)     Oral Impairment Domain: Oral Impairment Domain Lip Closure: Escape progressing to mid-chin Tongue control during bolus  hold: Posterior escape of greater than half of  bolus Bolus transport/lingual motion: Delayed initiation of tongue motion (oral  holding); Slow tongue motion Oral residue: Residue collection on oral structures Location of oral residue : Tongue Initiation of pharyngeal swallow : Pyriform sinuses  Pharyngeal Impairment Domain: Pharyngeal Impairment Domain Soft palate elevation: No bolus between soft palate (SP)/pharyngeal wall  (PW) Laryngeal elevation: Partial superior movement of thyroid  cartilage/partial approximation of arytenoids to epiglottic petiole Anterior hyoid excursion: Partial anterior movement Epiglottic movement: Partial inversion Laryngeal vestibule closure: Incomplete, narrow column air/contrast in  laryngeal vestibule Pharyngeal stripping wave : Present - diminished Pharyngeal contraction (A/P view only): N/A Pharyngoesophageal segment opening: Partial distention/partial duration,  partial obstruction of flow (difficult view due to pt's shoulder blocking  view and snap obstructing as well) Tongue base retraction: Wide column of contrast or air between tongue base  and PPW Pharyngeal residue: Majority of contrast within or on pharyngeal  structures; Minimal to no pharyngeal clearance Location of pharyngeal residue: Tongue base; Pharyngeal wall; Valleculae;  Diffuse (>3 areas)     Esophageal Impairment Domain: No data recorded  Pill: Pill Consistency administered: -- (DNt)  Penetration/Aspiration Scale Score: Penetration/Aspiration Scale Score 3.  Material enters airway, remains ABOVE vocal cords and not ejected out:  Mildly thick liquids (Level 2, nectar thick) 8.  Material enters airway, passes BELOW cords without attempt by patient  to eject out (silent aspiration) : Thin liquids (Level 0)  Compensatory Strategies: Compensatory Strategies Compensatory strategies: No      General Information: Caregiver present: No   Diet Prior to this  Study: NPO    Temperature : Normal    Respiratory Status: WFL    Supplemental O2: None (Room air)    History of Recent Intubation: No   Behavior/Cognition: Alert; Other (Comment) (inconsistently follows  directions - at times is resistant)  No data recorded Baseline vocal quality/speech: Hypophonia/low volume; Other (comment)  (wet)  Volitional Cough: Able to elicit  Volitional Swallow: Unable to elicit  Exam Limitations: Limited visibility; Poor bolus acceptance  Goal Planning: Prognosis for improved oropharyngeal function: Good  Barriers to Reach Goals: Time post onset  No data recorded Patient/Family Stated Goal: None stated, patient is nonverbal  Consulted and agree with results and recommendations: Patient; Nurse  Pain: Pain Assessment Pain Assessment: Faces Faces Pain Scale: 0 Breathing: 0 Negative Vocalization: 0 Facial Expression: 0 Body Language: 0 Consolability: 0 PAINAD Score: 0  End of Session: Start Time:SLP Start Time (ACUTE ONLY): 1020  Stop Time: SLP Stop Time (ACUTE ONLY): 1040  Time Calculation:SLP Time Calculation (min) (ACUTE ONLY): 20 min  Charges: SLP Evaluations $ SLP Speech Visit: 1 Visit  SLP Evaluations $BSS Swallow: 1 Procedure  SLP visit diagnosis: SLP Visit Diagnosis: Dysphagia, oropharyngeal phase  (R13.12)  Past Medical History:  Past Medical History:  Diagnosis Date   Anxiety    Arthritis    Chronic kidney disease    Hypertension    Hypokalemia    Hyponatremia    Hypothyroidism    Past Surgical History:  Past Surgical History:  Procedure Laterality Date   SHOULDER SURGERY     WRIST SURGERY     Rolena Infante, MS [facility] SLP Acute Rehab Services Office [phone]  Chales Abrahams 05/16/2023, 2:16 PM    I personally reviewed recent imaging.   Palliative Care Assessment and Plan Summary of Established Goals of Care and Medical Treatment Preferences   Patient is a 89 year old female with a past medical  history CKD, hypertension, and dementia receiving long-term care at a nursing facility who was admitted on 2/25 for management of sialoadenitis management.  CT head and neck on admission showed sialoadenitis and so ENT was consulted.  ENT recommended supportive care.  Noted that patient had  poor oral intake and was minimally verbal at baseline prior to admission.  Family members live out of state.  Palliative medicine team consulted for complex medical decision making.  # Complex medical decision making/goals of care  # Complex medical decision making/goals of care  -Patient unable to participate in medical decision making secondary to mental status.  -Spoke with patient's sons, [name] and [name].  Patient's daughter, Jacki Cones, is primary alternate HCPOA according to Ron though Jacki Cones is on a cruise  and currently unreachable.  Did attempt to contact multiple times via phone number listed in EMR.  Ron and [name] both agreed that patient would want to focus on comfort measures at this time.  Patient will be allowed to eat for comfort knowing aspiration risk.  Patient has not wanted to eat at home and had previously requested no feeding tube so will not force food at this time either.  Comfort is priority.  Will continue to monitor status with transition to comfort focused care.  Patient would either return to long-term care facility with hospice versus becoming inpatient hospice appropriate and evaluation could occur.  -At this time we will discontinue interventions that are no longer focused on comfort such as IV fluids, imaging, or lab work.  Will instead focus on symptom management of pain, dyspnea, and agitation in the setting of end-of-life care.    Code Status: Do not attempt resuscitation (DNR) - Comfort care  # Symptom management Patient is receiving these palliative interventions for symptom management with an intent to improve quality of life.     -Pain/Dyspnea, acute in the setting of end-of-life  care   -Start oral morphine solution as needed.  Continue adjust based on patient's symptom burden.                               -Start IV morphine as needed breakthrough after oral medication                  -Anxiety/agitation, in the setting of end-of-life care                               -Start as needed Haldol                  -Secretions, in the setting of end-of-life care                               -Start as needed glycopyrrolate  # Psycho-social/Spiritual Support:  - Support System: Daughter, sons  # Discharge Planning:  To Be Determined transition to comfort focused care at this time.  Would expect patient to either return to facility with hospice support versus become appropriate for inpatient hospice evaluation.  Thank you for allowing the palliative care team to participate in the care [facility].  Alvester Morin, DO Palliative Care Provider PMT # [phone]  If patient remains symptomatic despite maximum doses, please call PMT at [phone] between 0700 and 1900. Outside of these hours, please call attending, as PMT does not have night coverage.

## 2023-05-23 NOTE — TOC Progression Note (Signed)
Transition of Care [facility]) - Progression Note    Patient Details  Name: Bethany Blake MRN: [id number] Date of Birth: June 29, 1921  Transition of [facility]) CM/SW Contact  Otelia Santee, LCSW Phone Number: 05/23/2023, 2:27 PM  Clinical Narrative:    Spoke with pt's son, Khiley Lieser and confirmed plan for hospice services. Pt's son has selected to have services through [employer]. Referral made to [facility]. ACC to complete full evaluation of pt tomorrow morning.    Expected Discharge Plan: Memory Care Barriers to Discharge: Continued Medical Work up  Expected Discharge Plan and Services       Living arrangements for the past 2 months: Assisted Living Facility                                       Social Determinants of Health (SDOH) Interventions SDOH Screenings   Tobacco Use: Medium Risk (05/15/2023)    Readmission Risk Interventions     No data to display

## 2023-05-23 NOTE — Progress Notes (Signed)
SLP Cancellation Note  Patient Details Name: Bethany Blake MRN: [id number] DOB: 11-28-1921   Cancelled treatment:       Reason Eval/Treat Not Completed: Other (comment) (pt now comfort care; orders discontinued)  Rolena Infante, MS [facility] SLP Acute Rehab Services Office [phone]  Chales Abrahams 05/23/2023, 12:08 PM

## 2023-05-24 DIAGNOSIS — K1120 Sialoadenitis, unspecified: Secondary | ICD-10-CM | POA: Diagnosis not present

## 2023-05-24 DIAGNOSIS — Z66 Do not resuscitate: Secondary | ICD-10-CM | POA: Diagnosis not present

## 2023-05-24 DIAGNOSIS — Z515 Encounter for palliative care: Secondary | ICD-10-CM | POA: Diagnosis not present

## 2023-05-24 DIAGNOSIS — R52 Pain, unspecified: Secondary | ICD-10-CM

## 2023-05-24 DIAGNOSIS — Z79899 Other long term (current) drug therapy: Secondary | ICD-10-CM | POA: Diagnosis not present

## 2023-05-24 DIAGNOSIS — R451 Restlessness and agitation: Secondary | ICD-10-CM

## 2023-05-24 DIAGNOSIS — Z7189 Other specified counseling: Secondary | ICD-10-CM | POA: Diagnosis not present

## 2023-05-24 MED ORDER — LORAZEPAM 2 MG/ML IJ SOLN
0.2500 mg | INTRAMUSCULAR | Status: DC | PRN
  Administered 2023-05-24: 0.25 mg via INTRAVENOUS
  Filled 2023-05-24: qty 1

## 2023-05-24 NOTE — TOC Transition Note (Signed)
Transition of Care [facility]) - Discharge Note   Patient Details  Name: Bethany Blake MRN: [id number] Date of Birth: 1922-03-21  [facility]) CM/SW Contact:  Otelia Santee, LCSW Phone Number: 05/24/2023, 12:15 PM   Clinical Narrative:    Pt to transfer to [facility] for residential hospice. RN to call report to [phone]. Spoke with pt's son and confirmed discharge plans. DC packet w/ signed DNR placed at RN station. PTAR called at 12:54om for transportation.    Final next level of care: Hospice Medical Facility Barriers to Discharge: Barriers Resolved   Patient Goals and CMS Choice Patient states their goals for this hospitalization and ongoing recovery are:: For pt to transfer to [facility] Medicare.gov Compare Post Acute Care list provided to:: Patient Represenative (must comment) Choice offered to / list presented to : Adult Children [place] ownership interest in [facility].provided to::  (NA)    Discharge Placement                Patient to be transferred to facility by: PTAR Name of family member notified: Son Patient and family notified of of transfer: 05/24/23  Discharge Plan and Services Additional resources added to the After Visit Summary for                  DME Arranged: N/A DME Agency: NA                  Social Drivers of Health (SDOH) Interventions SDOH Screenings   Tobacco Use: Medium Risk (05/15/2023)     Readmission Risk Interventions    05/24/2023   12:14 PM  Readmission Risk Prevention Plan  Transportation Screening Complete  PCP or Specialist Appt within 5-7 Days Complete  Home Care Screening Complete  Medication Review (RN CM) Complete

## 2023-05-24 NOTE — Discharge Summary (Signed)
 Physician Discharge Summary  Yasmyn Bellisario Rogala WUJ:[id number] DOB: December 13, 1921 DOA: 05/15/2023  PCP: [name], MD  Admit date: 05/15/2023 Discharge date: 05/24/2023  Admitted From: home Disposition:  residential hospice  Discharge Condition: stable CODE STATUS: DNR  HPI: Per admitting MD, MAXIE DEBOSE is a 89 y.o. female with medical history significant for CKD, hypertension, advanced dementia being admitted to the hospital with sialoadenitis.  She is somewhat limited as the patient is nonverbal, she is a resident of [facility].  Her son and daughter both live out of state, ER provider spoke with her son over the phone.  Workup as detailed below shows evidence of dehydration, hypernatremia, AKI, and sialoadenitis.  Patient has been started on IV antibiotics, given IV fluids and admitted to the hospitalist service.   Hospital Course / Discharge diagnoses: Principal Problem:   Sialoadenitis Active Problems:   Palliative care encounter   Goals of care, counseling/discussion   DNR (do not resuscitate)   Concern about end of life   High risk medication use   Medication management   Need for emotional support   Counseling and coordination of care   Inadequate oral intake   Principal problem Sialoadenitis -CT head and neck on admission showed asymmetric soft tissue swelling around the right parotid gland, right submandibular gland at the subcutaneous soft tissues of the right face, concerning for sialoadenitis.  ENT consulted, she was treated with antibiotics, steroids, however continues to have poor response, poor p.o. intake.  Palliative care was consulted, and she was transitioned to comfort care.  Will be discharged to residential hospice   Active problems Acute kidney injury on chronic kidney disease stage IIIa Hypernatremia Anemia of chronic disease Leukocytosis Hypokalemia Hypothyroidism Thrombocytosis Severe protein calorie malnutrition Dementia  Sepsis ruled  out   Discharge Instructions   Allergies as of 05/24/2023       Reactions   Ace Inhibitors Other (See Comments)   Severe hyponatremia. Allergy not listed on MAR    Aldactone [spironolactone] Other (See Comments)   Severe hyponatremia. Allergy not listed on MAR    Angiotensin Receptor Blockers Other (See Comments)   Severe hyponatremia. Allergy not listed on MAR    Sulfa Antibiotics Rash   Allergy not listed on MAR         Medication List     STOP taking these medications    cholecalciferol 25 MCG (1000 UNIT) tablet Commonly known as: VITAMIN D3   levothyroxine 112 MCG tablet Commonly known as: SYNTHROID   loperamide 2 MG tablet Commonly known as: IMODIUM A-D   polyethylene glycol 17 g packet Commonly known as: MIRALAX / GLYCOLAX       TAKE these medications    acetaminophen 500 MG tablet Commonly known as: TYLENOL Take 500 mg by mouth every 5 (five) hours as needed (pain).       Consultations: ENT Palliative  Procedures/Studies:  DG Swallowing Func-Speech Pathology Result Date: 05/16/2023 Table formatting from the original result was not included. Modified Barium Swallow Study Patient Details Name: ADREANA COULL MRN: [id number] Date of Birth: 04-Mar-1922 Today's Date: 05/16/2023 HPI/PMH: HPI: Per MD note "NORBERTA STOBAUGH is a 89 y.o. female with medical history significant for CKD, hypertension, advanced dementia being admitted to the hospital with sialoadenitis and fall .  She is somewhat limited as the patient is nonverbal, she is a resident of [facility].  Her son and daughter both live out of state, ER provider spoke with her  son over the phone.  Workup as detailed below shows evidence of dehydration, hypernatremia, AKI, and sialoadenitis.  Patient has been started on IV antibiotics, given IV fluids and admitted to the hospitalist service."  Patient has prior medical history of dysphagia diagnosed October 2023 with recommendations of dysphagia 2 and  thin liquid at that time with strict precautions.  Past medical history also includes COVID.  MD was in touch with Dr. Jearld Fenton ENT who recommended IV antibiotics, intermittent gland massage, heat and NSAIDs. Clinical Impression: Clinical Impression: Limited assessment secondary to pt's edema and level of dysphagia - and view limited by pt's right shoulder being elevated to near ear impairing view.    Pt demonstrates moderate oral dysphagia with impaired lingual transiting of boluses and decr labial closure with resultant piecemealing and retention.  Severe pharyngeal dysphagia c/b severely impaired pharyngeal motility due to edema from current illness.  Pharyngeal contraction, tongue base retraction and laryngeal closure impaired allowing severe retention (mixing with severe secretions) and laryngeal penetration/trace aspiration.   Pt has some awareness/sensation to pharyngeal impairment as she conducts dry swallows without cues. She also largely senses severe frank laryngeal penetration as she reflexively coughs and attempts to expectorate.  Comparing imaging from prior MBS from 2023 reveals severe edema currently. Pt did not conduct head turn as SLP requested to determine if may decrease retention. At this time, recommend NPO with aggressive oral care and frequent oral moisture - tsps of thin water after mouth care and encouraging pt to cough and expectorate as able.  Will follow up for readiness for intake - hopefully as edema dissipates.  Of note, SLP provided oral suctioning following exam and removed viscous secretions coated with barium - frequent oral care is vital for this pt - RN informed. Factors that may increase risk of adverse event in presence of aspiration Rubye Oaks & Clearance Coots 2021): Factors that may increase risk of adverse event in presence of aspiration Rubye Oaks & Clearance Coots 2021): Reduced cognitive function; Poor general health and/or compromised immunity; Reduced saliva; Inadequate oral hygiene; Frail or  deconditioned; Limited mobility Recommendations/Plan: Swallowing Evaluation Recommendations Swallowing Evaluation Recommendations Recommendations: -- (tsps of thin water after mouth care) Liquid Administration via: Spoon Medication Administration: Via alternative means Supervision: Full supervision/cueing for swallowing strategies Swallowing strategies  : Slow rate; Small bites/sips Postural changes: Position pt fully upright for meals; Stay upright 30-60 min after meals Oral care recommendations: Oral care QID (4x/day) Caregiver Recommendations: Have oral suction available Treatment Plan Treatment Plan Treatment recommendations: Therapy as outlined in treatment plan below Follow-up recommendations: Skilled nursing-short term rehab (<3 hours/day) Functional status assessment: Patient has had a recent decline in their functional status and demonstrates the ability to make significant improvements in function in a reasonable and predictable amount of time. Treatment frequency: Min 1x/week Treatment duration: 1 week Interventions: Aspiration precaution training; Other (comment); Trials of upgraded texture/liquids; Patient/family education; Compensatory techniques Recommendations Recommendations for follow up therapy are one component of a multi-disciplinary discharge planning process, led by the attending physician.  Recommendations may be updated based on patient status, additional functional criteria and insurance authorization. Assessment: Orofacial Exam: Orofacial Exam Oral Cavity: Oral Hygiene: Xerostomia; Other (comment); Dried secretions Oral Cavity - Dentition: Other (Comment); Dentures, not available Orofacial Anatomy: Other (comment) Oral Motor/Sensory Function: Other (comment) (pt with difficulty sealing her lips - lingual protrusion) Anatomy: No data recorded Boluses Administered: Boluses Administered Boluses Administered: Thin liquids (Level 0); Mildly thick liquids (Level 2, nectar thick)  Oral Impairment  Domain: Oral Impairment  Domain Lip Closure: Escape progressing to mid-chin Tongue control during bolus hold: Posterior escape of greater than half of bolus Bolus transport/lingual motion: Delayed initiation of tongue motion (oral holding); Slow tongue motion Oral residue: Residue collection on oral structures Location of oral residue : Tongue Initiation of pharyngeal swallow : Pyriform sinuses  Pharyngeal Impairment Domain: Pharyngeal Impairment Domain Soft palate elevation: No bolus between soft palate (SP)/pharyngeal wall (PW) Laryngeal elevation: Partial superior movement of thyroid cartilage/partial approximation of arytenoids to epiglottic petiole Anterior hyoid excursion: Partial anterior movement Epiglottic movement: Partial inversion Laryngeal vestibule closure: Incomplete, narrow column air/contrast in laryngeal vestibule Pharyngeal stripping wave : Present - diminished Pharyngeal contraction (A/P view only): N/A Pharyngoesophageal segment opening: Partial distention/partial duration, partial obstruction of flow (difficult view due to pt's shoulder blocking view and snap obstructing as well) Tongue base retraction: Wide column of contrast or air between tongue base and PPW Pharyngeal residue: Majority of contrast within or on pharyngeal structures; Minimal to no pharyngeal clearance Location of pharyngeal residue: Tongue base; Pharyngeal wall; Valleculae; Diffuse (>3 areas)  Esophageal Impairment Domain: No data recorded Pill: Pill Consistency administered: -- (DNt) Penetration/Aspiration Scale Score: Penetration/Aspiration Scale Score 3.  Material enters airway, remains ABOVE vocal cords and not ejected out: Mildly thick liquids (Level 2, nectar thick) 8.  Material enters airway, passes BELOW cords without attempt by patient to eject out (silent aspiration) : Thin liquids (Level 0) Compensatory Strategies: Compensatory Strategies Compensatory strategies: No   General Information: Caregiver present: No   Diet Prior to this Study: NPO   Temperature : Normal   Respiratory Status: WFL   Supplemental O2: None (Room air)   History of Recent Intubation: No  Behavior/Cognition: Alert; Other (Comment) (inconsistently follows directions - at times is resistant) No data recorded Baseline vocal quality/speech: Hypophonia/low volume; Other (comment) (wet) Volitional Cough: Able to elicit Volitional Swallow: Unable to elicit Exam Limitations: Limited visibility; Poor bolus acceptance Goal Planning: Prognosis for improved oropharyngeal function: Good Barriers to Reach Goals: Time post onset No data recorded Patient/Family Stated Goal: None stated, patient is nonverbal Consulted and agree with results and recommendations: Patient; Nurse Pain: Pain Assessment Pain Assessment: Faces Faces Pain Scale: 0 Breathing: 0 Negative Vocalization: 0 Facial Expression: 0 Body Language: 0 Consolability: 0 PAINAD Score: 0 End of Session: Start Time:SLP Start Time (ACUTE ONLY): 1020 Stop Time: SLP Stop Time (ACUTE ONLY): 1040 Time Calculation:SLP Time Calculation (min) (ACUTE ONLY): 20 min Charges: SLP Evaluations $ SLP Speech Visit: 1 Visit SLP Evaluations $BSS Swallow: 1 Procedure SLP visit diagnosis: SLP Visit Diagnosis: Dysphagia, oropharyngeal phase (R13.12) Past Medical History: Past Medical History: Diagnosis Date  Anxiety   Arthritis   Chronic kidney disease   Hypertension   Hypokalemia   Hyponatremia   Hypothyroidism  Past Surgical History: Past Surgical History: Procedure Laterality Date  SHOULDER SURGERY    WRIST SURGERY   Rolena Infante, MS [facility] SLP Acute Rehab Services Office [phone] Chales Abrahams 05/16/2023, 2:16 PM  CT Head Wo Contrast Result Date: 05/15/2023 CLINICAL DATA:  Polytrauma, blunt; Facial trauma, blunt EXAM: CT HEAD WITHOUT CONTRAST CT MAXILLOFACIAL WITHOUT CONTRAST CT CERVICAL SPINE WITHOUT CONTRAST TECHNIQUE: Multidetector CT imaging of the head, cervical spine, and maxillofacial structures were performed using the  standard protocol without intravenous contrast. Multiplanar CT image reconstructions of the cervical spine and maxillofacial structures were also generated. RADIATION DOSE REDUCTION: This exam was performed according to the departmental dose-optimization program which includes automated exposure control, adjustment of the mA and/or kV according  to patient size and/or use of iterative reconstruction technique. COMPARISON:  CT Head and C Spine 01/14/22 FINDINGS: CT HEAD FINDINGS Brain: No hemorrhage. No hydrocephalus no extra-axial fluid collection. No mass effect. No mass lesion. 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 Skull: Normal. Negative for fracture or focal lesion. Other: None. CT MAXILLOFACIAL FINDINGS Osseous: No fracture or mandibular dislocation. No destructive process. Orbits: Negative. No traumatic or inflammatory finding. Bilateral lens replacement. Sinuses: No middle ear or mastoid effusion. Paranasal sinuses are clear. Soft tissues: There is asymmetric soft tissue swelling around the right parotid gland, right submandibular gland, and in the subcutaneous soft tissues of the right face. Findings are worrisome for a sialoadenitis. No evidence of a sialolith. Consider further evaluation with dedicated contrast-enhanced neck CT for more definitive characterization CT CERVICAL SPINE FINDINGS Alignment: Grade 1 anterolisthesis of C2 on C3, C3 on C4, and C7 on T1 Skull base and vertebrae: No acute fracture. No primary bone lesion or focal pathologic process. Soft tissues and spinal canal: No prevertebral fluid or swelling. No visible canal hematoma. Disc levels:  No CT evidence of high-grade spinal canal stenosis. Upper chest: Negative. Other: Redemonstrated is chronic osseous remodeling around the right glenohumeral joint with thinning of the distal right clavicle IMPRESSION: 1. No CT  evidence of intracranial injury. 2. No acute facial bone fracture. 3. No acute fracture or traumatic subluxation of the cervical spine. 4. Asymmetric soft tissue swelling around the right parotid gland, right submandibular gland, and in the subcutaneous soft tissues of the right face. Findings are worrisome for a sialoadenitis. No evidence of a sialolith. Consider further evaluation with dedicated contrast-enhanced neck CT for further evaluation. 5. 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 Electronically Signed   By: Lorenza Cambridge M.D.   On: 05/15/2023 12:51   CT Cervical Spine Wo Contrast Result Date: 05/15/2023 CLINICAL DATA:  Polytrauma, blunt; Facial trauma, blunt EXAM: CT HEAD WITHOUT CONTRAST CT MAXILLOFACIAL WITHOUT CONTRAST CT CERVICAL SPINE WITHOUT CONTRAST TECHNIQUE: Multidetector CT imaging of the head, cervical spine, and maxillofacial structures were performed using the standard protocol without intravenous contrast. Multiplanar CT image reconstructions of the cervical spine and maxillofacial structures were also generated. RADIATION DOSE REDUCTION: This exam was performed according to the departmental dose-optimization program which includes automated exposure control, adjustment of the mA and/or kV according to patient size and/or use of iterative reconstruction technique. COMPARISON:  CT Head and C Spine 01/14/22 FINDINGS: CT HEAD FINDINGS Brain: No hemorrhage. No hydrocephalus no extra-axial fluid collection. No mass effect. No mass lesion. 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 Skull: Normal. Negative for fracture or focal lesion. Other: None. CT MAXILLOFACIAL FINDINGS Osseous: No fracture or mandibular dislocation. No destructive process. Orbits: Negative. No traumatic or inflammatory finding. Bilateral lens  replacement. Sinuses: No middle ear or mastoid effusion. Paranasal sinuses are clear. Soft tissues: There is asymmetric soft tissue swelling around the right parotid gland, right submandibular gland, and in the subcutaneous soft tissues of the right face. Findings are worrisome for a sialoadenitis. No evidence of a sialolith. Consider further evaluation with dedicated contrast-enhanced neck CT for more definitive characterization CT CERVICAL SPINE FINDINGS Alignment: Grade 1 anterolisthesis of C2 on C3, C3  on C4, and C7 on T1 Skull base and vertebrae: No acute fracture. No primary bone lesion or focal pathologic process. Soft tissues and spinal canal: No prevertebral fluid or swelling. No visible canal hematoma. Disc levels:  No CT evidence of high-grade spinal canal stenosis. Upper chest: Negative. Other: Redemonstrated is chronic osseous remodeling around the right glenohumeral joint with thinning of the distal right clavicle IMPRESSION: 1. No CT evidence of intracranial injury. 2. No acute facial bone fracture. 3. No acute fracture or traumatic subluxation of the cervical spine. 4. Asymmetric soft tissue swelling around the right parotid gland, right submandibular gland, and in the subcutaneous soft tissues of the right face. Findings are worrisome for a sialoadenitis. No evidence of a sialolith. Consider further evaluation with dedicated contrast-enhanced neck CT for further evaluation. 5. 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 Electronically Signed   By: Lorenza Cambridge M.D.   On: 05/15/2023 12:51   CT Maxillofacial WO CM Result Date: 05/15/2023 CLINICAL DATA:  Polytrauma, blunt; Facial trauma, blunt EXAM: CT HEAD WITHOUT CONTRAST CT MAXILLOFACIAL WITHOUT CONTRAST CT CERVICAL SPINE WITHOUT CONTRAST TECHNIQUE: Multidetector CT imaging of the head, cervical spine, and maxillofacial structures were performed using the standard protocol without  intravenous contrast. Multiplanar CT image reconstructions of the cervical spine and maxillofacial structures were also generated. RADIATION DOSE REDUCTION: This exam was performed according to the departmental dose-optimization program which includes automated exposure control, adjustment of the mA and/or kV according to patient size and/or use of iterative reconstruction technique. COMPARISON:  CT Head and C Spine 01/14/22 FINDINGS: CT HEAD FINDINGS Brain: No hemorrhage. No hydrocephalus no extra-axial fluid collection. No mass effect. No mass lesion. 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 Skull: Normal. Negative for fracture or focal lesion. Other: None. CT MAXILLOFACIAL FINDINGS Osseous: No fracture or mandibular dislocation. No destructive process. Orbits: Negative. No traumatic or inflammatory finding. Bilateral lens replacement. Sinuses: No middle ear or mastoid effusion. Paranasal sinuses are clear. Soft tissues: There is asymmetric soft tissue swelling around the right parotid gland, right submandibular gland, and in the subcutaneous soft tissues of the right face. Findings are worrisome for a sialoadenitis. No evidence of a sialolith. Consider further evaluation with dedicated contrast-enhanced neck CT for more definitive characterization CT CERVICAL SPINE FINDINGS Alignment: Grade 1 anterolisthesis of C2 on C3, C3 on C4, and C7 on T1 Skull base and vertebrae: No acute fracture. No primary bone lesion or focal pathologic process. Soft tissues and spinal canal: No prevertebral fluid or swelling. No visible canal hematoma. Disc levels:  No CT evidence of high-grade spinal canal stenosis. Upper chest: Negative. Other: Redemonstrated is chronic osseous remodeling around the right glenohumeral joint with thinning of the distal right clavicle IMPRESSION: 1. No CT evidence of intracranial injury.  2. No acute facial bone fracture. 3. No acute fracture or traumatic subluxation of the cervical spine. 4. Asymmetric soft tissue swelling around the right parotid gland, right submandibular gland, and in the subcutaneous soft tissues of the right face. Findings are worrisome for a sialoadenitis. No evidence of a sialolith. Consider further evaluation with dedicated contrast-enhanced neck CT for further evaluation. 5. 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 Electronically Signed   By: Lorenza Cambridge M.D.   On: 05/15/2023  12:51   DG Pelvis 1-2 Views Result Date: 05/15/2023 CLINICAL DATA:  89 year old female status post fall. EXAM: PELVIS - 1-2 VIEW COMPARISON:  CT Abdomen and Pelvis 01/03/2020. Pelvis radiograph 11/06/2018. FINDINGS: Portable AP supine view at 1216 hours. Mildly oblique to the right. Femoral heads remain normally located. Osteopenia. Limited pelvic bone detail also due to rectal retained stool. No pelvis fracture identified. Aortoiliac calcified atherosclerosis. Grossly intact proximal femurs. Nonobstructed bowel-gas pattern. IMPRESSION: No acute fracture or dislocation identified about the pelvis. If there is lateralizing hip pain then dedicated hip series is recommended. Electronically Signed   By: Odessa Fleming M.D.   On: 05/15/2023 12:40    Subjective: confused  Discharge Exam: BP (!) 161/68 (BP Location: Left Arm)   Pulse 80   Temp 98 F (36.7 C) (Oral)   Resp 18   Ht 5\' 4"  (1.626 m)   Wt 47.6 kg   SpO2 100%   BMI 18.01 kg/m   General: slightly agitated    The results of significant diagnostics from this hospitalization (including imaging, microbiology, ancillary and laboratory) are listed below for reference.     Microbiology: No results found for this or any previous visit (from the past 240 hours).   Labs: Basic Metabolic Panel: Recent Labs  Lab 05/18/23 0437 05/19/23 1000 05/20/23 0511 05/22/23 0859  05/23/23 0448  NA 145 136 134* 136 128*  K 3.4* 3.8 3.6 3.4* 4.0  CL 114* 108 105 103 99  CO2 19* 21* 21* 24 21*  GLUCOSE 122* 172* 170* 68* 85  BUN 34* 34* 26* 14 15  CREATININE 1.04* 1.10* 0.92 0.74 0.75  CALCIUM 8.1* 8.1* 7.9* 8.1* 7.7*   Liver Function Tests: No results for input(s): "AST", "ALT", "ALKPHOS", "BILITOT", "PROT", "ALBUMIN" in the last 168 hours. CBC: Recent Labs  Lab 05/18/23 0437 05/19/23 1000 05/20/23 0511 05/22/23 0859 05/23/23 0448  WBC 7.3 7.3 6.1 5.2 5.9  HGB 8.1* 8.0* 8.2* 8.8* 8.9*  HCT 28.1* 27.2* 28.4* 29.4* 30.1*  MCV 82.6 78.8* 80.2 77.6* 79.2*  PLT 273 285 268 241 216   CBG: Recent Labs  Lab 05/19/23 1406  GLUCAP 160*   Hgb A1c No results for input(s): "HGBA1C" in the last 72 hours. Lipid Profile No results for input(s): "CHOL", "HDL", "LDLCALC", "TRIG", "CHOLHDL", "LDLDIRECT" in the last 72 hours. Thyroid function studies No results for input(s): "TSH", "T4TOTAL", "T3FREE", "THYROIDAB" in the last 72 hours.  Invalid input(s): "FREET3" Urinalysis    Component Value Date/Time   COLORURINE AMBER (A) 01/14/2022 0641   APPEARANCEUR HAZY (A) 01/14/2022 0641   LABSPEC 1.016 01/14/2022 0641   PHURINE 5.0 01/14/2022 0641   GLUCOSEU NEGATIVE 01/14/2022 0641   HGBUR MODERATE (A) 01/14/2022 0641   BILIRUBINUR NEGATIVE 01/14/2022 0641   KETONESUR 20 (A) 01/14/2022 0641   PROTEINUR NEGATIVE 01/14/2022 0641   UROBILINOGEN 0.2 08/28/2014 0227   NITRITE POSITIVE (A) 01/14/2022 0641   LEUKOCYTESUR MODERATE (A) 01/14/2022 0641    FURTHER DISCHARGE INSTRUCTIONS:   Get Medicines reviewed and adjusted: Please take all your medications with you for your next visit with your Primary MD   Laboratory/radiological data: Please request your Primary MD to go over all hospital tests and procedure/radiological results at the follow up, please ask your Primary MD to get all Hospital records sent to his/her office.   In some cases, they will be blood  work, cultures and biopsy results pending at the time of your discharge. Please request that your primary care M.D. goes through  all the records of your hospital data and follows up on these results.   Also Note the following: If you experience worsening of your admission symptoms, develop shortness of breath, life threatening emergency, suicidal or homicidal thoughts you must seek medical attention immediately by calling 911 or calling your MD immediately  if symptoms less severe.   You must read complete instructions/literature along with all the possible adverse reactions/side effects for all the Medicines you take and that have been prescribed to you. Take any new Medicines after you have completely understood and accpet all the possible adverse reactions/side effects.    Do not drive when taking Pain medications or sleeping medications (Benzodaizepines)   Do not take more than prescribed Pain, Sleep and Anxiety Medications. It is not advisable to combine anxiety,sleep and pain medications without talking with your primary care practitioner   Special Instructions: If you have smoked or chewed Tobacco  in the last 2 yrs please stop smoking, stop any regular Alcohol  and or any Recreational drug use.   Wear Seat belts while driving.   Please note: You were cared for by a hospitalist during your hospital stay. Once you are discharged, your primary care physician will handle any further medical issues. Please note that NO REFILLS for any discharge medications will be authorized once you are discharged, as it is imperative that you return to your primary care physician (or establish a relationship with a primary care physician if you do not have one) for your post hospital discharge needs so that they can reassess your need for medications and monitor your lab values.  Time coordinating discharge: 35 minutes  SIGNED:  Pamella Pert, MD, PhD 05/24/2023, 11:57 AM

## 2023-05-24 NOTE — Plan of Care (Signed)
Pt discharged to [facility] place. P/u by ambulance service PTAR. Discharge education and instructions provided to ambulance personnel. Report given to Alexis Frock RN at [facility] place. IV in place. Son Ron called upon ambulance arrival.  Problem: Education: Goal: Knowledge of General Education information will improve Description: Including pain rating scale, medication(s)/side effects and non-pharmacologic comfort measures Outcome: Adequate for Discharge   Problem: Health Behavior/Discharge Planning: Goal: Ability to manage health-related needs will improve Outcome: Adequate for Discharge   Problem: Clinical Measurements: Goal: Ability to maintain clinical measurements within normal limits will improve Outcome: Adequate for Discharge Goal: Will remain free from infection Outcome: Adequate for Discharge Goal: Diagnostic test results will improve Outcome: Adequate for Discharge Goal: Respiratory complications will improve Outcome: Adequate for Discharge Goal: Cardiovascular complication will be avoided Outcome: Adequate for Discharge   Problem: Activity: Goal: Risk for activity intolerance will decrease Outcome: Adequate for Discharge   Problem: Nutrition: Goal: Adequate nutrition will be maintained Outcome: Adequate for Discharge   Problem: Coping: Goal: Level of anxiety will decrease Outcome: Adequate for Discharge   Problem: Elimination: Goal: Will not experience complications related to bowel motility Outcome: Adequate for Discharge Goal: Will not experience complications related to urinary retention Outcome: Adequate for Discharge   Problem: Pain Managment: Goal: General experience of comfort will improve and/or be controlled Outcome: Adequate for Discharge   Problem: Safety: Goal: Ability to remain free from injury will improve Outcome: Adequate for Discharge   Problem: Skin Integrity: Goal: Risk for impaired skin integrity will decrease Outcome:  Adequate for Discharge   Problem: Education: Goal: Knowledge of the prescribed therapeutic regimen will improve Outcome: Adequate for Discharge   Problem: Coping: Goal: Ability to identify and develop effective coping behavior will improve Outcome: Adequate for Discharge   Problem: Clinical Measurements: Goal: Quality of life will improve Outcome: Adequate for Discharge   Problem: Respiratory: Goal: Verbalizations of increased ease of respirations will increase Outcome: Adequate for Discharge   Problem: Role Relationship: Goal: Family's ability to cope with current situation will improve Outcome: Adequate for Discharge Goal: Ability to verbalize concerns, feelings, and thoughts to partner or family member will improve Outcome: Adequate for Discharge   Problem: Pain Management: Goal: Satisfaction with pain management regimen will improve Outcome: Adequate for Discharge

## 2023-05-24 NOTE — Progress Notes (Signed)
Daily Progress Note   Patient Name: Bethany Blake       Date: 05/24/2023 DOB: 1921/05/15  Age: 89 y.o. MRN#: [id number] Attending Physician: Leatha Gilding, MD Primary Care Physician: [name], MD Admit Date: 05/15/2023 Length of Stay: 9 days  Reason for Consultation/Follow-up: Establishing goals of care  Subjective:   CC: Patient appears agitated laying in the bed today.  Following up regarding complex medical decision making.  Subjective:  Reviewed EMR prior to presenting to bedside.  At time of EMR review in past 24 hours patient has received oral morphine as needed 2 mg x 1 dose.  Patient has had greatly decreased urine output and no oral intake noted.  Presented to bedside to see patient.  No family present at bedside.  Patient laying in bed and appears to be agitated and in pain.  Patient with noted dry mucous membranes.  Discussed care with team and will be providing IV medication due to patient's worsening symptom burden at this time.  Discussed care with [facility] liaison and patient has been accepted to [facility] for inpatient hospice.  Plan to transfer today.  Objective:   Vital Signs:  BP (!) 170/61 (BP Location: Right Arm)   Pulse (!) 59   Temp 97.6 F (36.4 C) (Oral)   Resp 17   Ht 5\' 4"  (1.626 m)   Wt 47.6 kg   SpO2 96%   BMI 18.01 kg/m   Physical Exam: General: agitated, chronically ill-appearing, cachectic, frail HENT: dry mucous membranes Cardiovascular: RRR Respiratory: no increased work of breathing noted, not in respiratory distress Neuro: agitated  Imaging: I personally reviewed recent imaging.   Assessment & Plan:   Assessment: Patient is a 89 year old female with a past medical history CKD, hypertension, and dementia receiving long-term care at a nursing facility who was admitted on 2/25 for management of sialoadenitis management.  CT head and neck on admission showed sialoadenitis and so ENT was consulted.  ENT recommended supportive care.   Noted that patient had poor oral intake and was minimally verbal at baseline prior to admission.  Family members live out of state.  Palliative medicine team consulted for complex medical decision making.   Recommendations/Plan: # Complex medical decision making/goals of care:  - Patient unable to participate in complex medical decision making due to to medical status.  -I discussed care with family members and patient was transition to full comfort focused care on 05/23/2023.  Family pursuing inpatient hospice referral through [facility].  [facility] evaluated patient and patient has been accepted to [facility] for inpatient hospice care today.  TOC assisting with transfer.  -  Code Status: Do not attempt resuscitation (DNR) - Comfort care  # Symptom management: Patient is receiving these palliative interventions for symptom management with an intent to improve quality of life.                   -Pain/Dyspnea, acute in the setting of end-of-life care                               -Stop oral morphine                               -Continue IV morphine as needed breakthrough after oral medication                  -Anxiety/agitation,  in the setting of end-of-life care                               -Continue as needed IV Haldol          -Start IV lorazepam 0.25 mg every 4 hours as needed                  -Secretions, in the setting of end-of-life care                               -Continue as needed IV glycopyrrolate   # Discharge Planning: Hospice facility  Thank you for allowing the palliative care team to participate in the care [facility].  Alvester Morin, DO Palliative Care Provider PMT # [phone]  If patient remains symptomatic despite maximum doses, please call PMT at [phone] between 0700 and 1900. Outside of these hours, please call attending, as PMT does not have night coverage.

## 2023-05-24 NOTE — Progress Notes (Deleted)
PROGRESS NOTE  Bethany Blake ZOX:[id number] DOB: [birth year] DOA: 05/15/2023 PCP: [name], MD   LOS: 9 days   Brief Narrative / Interim history: 89 year old female with CKD, HTN, advanced dementia admitted to the hospital with sialoadenitis.  She is a resident of nursing facility, and patient is minimally verbal at baseline.  Family lives out of state.  ENT consulted, she was placed on treatment with steroids, antibiotics, however she continues not to do well overall with poor p.o. intake.  GOC ongoing  Subjective / 24h Interval events: Slightly with ink agitation  Assesement and Plan: Principal problem Sialoadenitis -CT head and neck on admission showed asymmetric soft tissue swelling around the right parotid gland, right submandibular gland at the subcutaneous soft tissues of the right face, concerning for sialoadenitis.  ENT consulted, received supportive care, but continued to decline and have poor p.o. intake.  Palliative care consulted, now transition towards comfort.  Active problems Goals of care-now focusing on comfort  Acute kidney injury on chronic kidney disease stage IIIa-creatinine peaked at 1.7, baseline around 1.1.   Hypernatremia-due to dehydration Anemia of chronic disease Leukocytosis-improved Hypokalemia Hypothyroidism Thrombocytosis-reactive Severe protein calorie malnutrition Dementia  Scheduled Meds:  mouth rinse  15 mL Mouth Rinse 4 times per day   Continuous Infusions:   PRN Meds:.acetaminophen **OR** acetaminophen, albuterol, antiseptic oral rinse, bisacodyl, [DISCONTINUED] glycopyrrolate **OR** [DISCONTINUED] glycopyrrolate **OR** glycopyrrolate, [DISCONTINUED] haloperidol **OR** [DISCONTINUED] haloperidol **OR** haloperidol lactate, LORazepam, morphine injection, [DISCONTINUED] ondansetron **OR** ondansetron (ZOFRAN) IV, mouth rinse, mouth rinse, polyvinyl alcohol  Current Outpatient Medications  Medication Instructions   acetaminophen (TYLENOL) 500  mg, Oral, Every 5 hours PRN   cholecalciferol (VITAMIN D3) 1,000 Units, Oral, Daily   levothyroxine (SYNTHROID) 112 mcg, Oral, Daily   loperamide (IMODIUM A-D) 2 mg, Oral, Every 8 hours PRN   polyethylene glycol (MIRALAX / GLYCOLAX) 17 g, Oral, Daily PRN    Diet Orders (From admission, onward)     Start     Ordered   05/23/23 1153  Diet regular Room service appropriate? Yes; Fluid consistency: Thin  Diet effective now       Question Answer Comment  Room service appropriate? Yes   Fluid consistency: Thin      05/23/23 1152            DVT prophylaxis:    Lab Results  Component Value Date   PLT 216 05/23/2023      Code Status: Do not attempt resuscitation (DNR) - Comfort care  Family Communication: no family at bedside   Status is: Inpatient Remains inpatient appropriate because: severity of illness  Level of care: Med-Surg  Consultants:  Palliative ENT  Objective: Vitals:   05/22/23 1933 05/23/23 0432 05/23/23 1421 05/24/23 0439  BP: 138/65 (!) 157/66 (!) 152/64 (!) 170/61  Pulse: 68 66 61 (!) 59  Resp: 20 20 18 17   Temp: 98.2 F (36.8 C) 97.7 F (36.5 C) (!) 96.8 F (36 C) 97.6 F (36.4 C)  TempSrc:    Oral  SpO2: 95% 97% 97% 96%  Weight:      Height:        Intake/Output Summary (Last 24 hours) at 05/24/2023 1038 Last data filed at 05/23/2023 1742 Gross per 24 hour  Intake --  Output 150 ml  Net -150 ml   Wt Readings from Last 3 Encounters:  05/15/23 47.6 kg  01/19/22 47.6 kg  06/20/21 58.8 kg    Examination:  Constitutional: No distress   Data Reviewed: I have independently reviewed  following labs and imaging studies   CBC Recent Labs  Lab 05/18/23 0437 05/19/23 1000 05/20/23 0511 05/22/23 0859 05/23/23 0448  WBC 7.3 7.3 6.1 5.2 5.9  HGB 8.1* 8.0* 8.2* 8.8* 8.9*  HCT 28.1* 27.2* 28.4* 29.4* 30.1*  PLT 273 285 268 241 216  MCV 82.6 78.8* 80.2 77.6* 79.2*  MCH 23.8* 23.2* 23.2* 23.2* 23.4*  MCHC 28.8* 29.4* 28.9* 29.9* 29.6*   RDW 17.9* 17.8* 17.7* 17.7* 18.1*    Recent Labs  Lab 05/18/23 0437 05/19/23 1000 05/20/23 0511 05/22/23 0859 05/23/23 0448  NA 145 136 134* 136 128*  K 3.4* 3.8 3.6 3.4* 4.0  CL 114* 108 105 103 99  CO2 19* 21* 21* 24 21*  GLUCOSE 122* 172* 170* 68* 85  BUN 34* 34* 26* 14 15  CREATININE 1.04* 1.10* 0.92 0.74 0.75  CALCIUM 8.1* 8.1* 7.9* 8.1* 7.7*    ------------------------------------------------------------------------------------------------------------------ No results for input(s): "CHOL", "HDL", "LDLCALC", "TRIG", "CHOLHDL", "LDLDIRECT" in the last 72 hours.  No results found for: "HGBA1C" ------------------------------------------------------------------------------------------------------------------ No results for input(s): "TSH", "T4TOTAL", "T3FREE", "THYROIDAB" in the last 72 hours.  Invalid input(s): "FREET3"  Cardiac Enzymes No results for input(s): "CKMB", "TROPONINI", "MYOGLOBIN" in the last 168 hours.  Invalid input(s): "CK" ------------------------------------------------------------------------------------------------------------------ No results found for: "BNP"  CBG: Recent Labs  Lab 05/19/23 1406  GLUCAP 160*    No results found for this or any previous visit (from the past 240 hours).   Radiology Studies: No results found.   Pamella Pert, MD, PhD [facility]  Between 7 am - 7 pm I am available, please contact me via Amion (for emergencies) or Securechat (non urgent messages)  Between 7 pm - 7 am I am not available, please contact night coverage MD/APP via Amion

## 2023-06-09 DEATH — deceased
